# Patient Record
Sex: Male | Born: 1960 | Race: White | Hispanic: No | Marital: Married | Smoking: Former smoker
Health system: Southern US, Community
[De-identification: ages and names within clinical notes are randomized; demographics above are authoritative.]

## PROBLEM LIST (undated history)

## (undated) DIAGNOSIS — M79643 Pain in unspecified hand: Secondary | ICD-10-CM

## (undated) DIAGNOSIS — I2510 Atherosclerotic heart disease of native coronary artery without angina pectoris: Secondary | ICD-10-CM

## (undated) DIAGNOSIS — I219 Acute myocardial infarction, unspecified: Secondary | ICD-10-CM

## (undated) DIAGNOSIS — R109 Unspecified abdominal pain: Secondary | ICD-10-CM

## (undated) DIAGNOSIS — T7840XA Allergy, unspecified, initial encounter: Secondary | ICD-10-CM

## (undated) DIAGNOSIS — Z8619 Personal history of other infectious and parasitic diseases: Secondary | ICD-10-CM

## (undated) DIAGNOSIS — E785 Hyperlipidemia, unspecified: Secondary | ICD-10-CM

## (undated) DIAGNOSIS — N186 End stage renal disease: Secondary | ICD-10-CM

## (undated) DIAGNOSIS — N189 Chronic kidney disease, unspecified: Secondary | ICD-10-CM

## (undated) DIAGNOSIS — Z992 Dependence on renal dialysis: Secondary | ICD-10-CM

## (undated) DIAGNOSIS — J449 Chronic obstructive pulmonary disease, unspecified: Secondary | ICD-10-CM

## (undated) DIAGNOSIS — R10A1 Flank pain, right side: Secondary | ICD-10-CM

## (undated) DIAGNOSIS — J40 Bronchitis, not specified as acute or chronic: Secondary | ICD-10-CM

## (undated) DIAGNOSIS — C801 Malignant (primary) neoplasm, unspecified: Secondary | ICD-10-CM

## (undated) DIAGNOSIS — H9190 Unspecified hearing loss, unspecified ear: Secondary | ICD-10-CM

## (undated) DIAGNOSIS — M1990 Unspecified osteoarthritis, unspecified site: Secondary | ICD-10-CM

## (undated) DIAGNOSIS — I252 Old myocardial infarction: Secondary | ICD-10-CM

## (undated) DIAGNOSIS — I10 Essential (primary) hypertension: Secondary | ICD-10-CM

## (undated) HISTORY — DX: Essential (primary) hypertension: I10

## (undated) HISTORY — DX: Old myocardial infarction: I25.2

## (undated) HISTORY — PX: CARDIAC CATHETERIZATION: SHX172

## (undated) HISTORY — DX: Pain in unspecified hand: M79.643

## (undated) HISTORY — PX: RENAL BIOPSY: SHX156

## (undated) HISTORY — PX: COLONOSCOPY: SHX174

## (undated) HISTORY — DX: Hyperlipidemia, unspecified: E78.5

## (undated) HISTORY — DX: Acute myocardial infarction, unspecified: I21.9

## (undated) HISTORY — DX: Allergy, unspecified, initial encounter: T78.40XA

## (undated) HISTORY — DX: Malignant (primary) neoplasm, unspecified: C80.1

## (undated) HISTORY — DX: Flank pain, right side: R10.A1

## (undated) HISTORY — DX: Unspecified abdominal pain: R10.9

## (undated) HISTORY — DX: Bronchitis, not specified as acute or chronic: J40

## (undated) HISTORY — PX: VASECTOMY: SHX75

## (undated) HISTORY — PX: JOINT REPLACEMENT: SHX530

## (undated) HISTORY — DX: Atherosclerotic heart disease of native coronary artery without angina pectoris: I25.10

---

## 1998-06-14 DIAGNOSIS — I219 Acute myocardial infarction, unspecified: Secondary | ICD-10-CM

## 1998-06-14 HISTORY — DX: Acute myocardial infarction, unspecified: I21.9

## 2000-07-20 ENCOUNTER — Encounter: Payer: Self-pay | Admitting: Emergency Medicine

## 2000-07-21 ENCOUNTER — Inpatient Hospital Stay (HOSPITAL_COMMUNITY): Admission: EM | Admit: 2000-07-21 | Discharge: 2000-07-23 | Payer: Self-pay | Admitting: Emergency Medicine

## 2000-07-21 HISTORY — PX: CARDIAC CATHETERIZATION: SHX172

## 2001-12-28 ENCOUNTER — Encounter: Payer: Self-pay | Admitting: Cardiology

## 2001-12-28 ENCOUNTER — Ambulatory Visit (HOSPITAL_COMMUNITY): Admission: RE | Admit: 2001-12-28 | Discharge: 2001-12-28 | Payer: Self-pay | Admitting: Cardiology

## 2004-07-01 ENCOUNTER — Ambulatory Visit: Payer: Self-pay | Admitting: Cardiology

## 2004-07-07 ENCOUNTER — Ambulatory Visit: Payer: Self-pay | Admitting: Internal Medicine

## 2005-02-03 ENCOUNTER — Ambulatory Visit: Payer: Self-pay | Admitting: Internal Medicine

## 2005-08-10 ENCOUNTER — Ambulatory Visit: Payer: Self-pay | Admitting: Internal Medicine

## 2006-01-19 ENCOUNTER — Ambulatory Visit: Payer: Self-pay | Admitting: Internal Medicine

## 2006-01-26 ENCOUNTER — Ambulatory Visit: Payer: Self-pay | Admitting: Internal Medicine

## 2007-02-22 ENCOUNTER — Ambulatory Visit: Payer: Self-pay | Admitting: Internal Medicine

## 2007-02-22 LAB — CONVERTED CEMR LAB
ALT: 18 units/L (ref 0–53)
AST: 20 units/L (ref 0–37)
Albumin: 3.6 g/dL (ref 3.5–5.2)
Alkaline Phosphatase: 35 units/L — ABNORMAL LOW (ref 39–117)
BUN: 11 mg/dL (ref 6–23)
Bilirubin, Direct: 0.1 mg/dL (ref 0.0–0.3)
CO2: 26 meq/L (ref 19–32)
Calcium: 9.2 mg/dL (ref 8.4–10.5)
Chloride: 108 meq/L (ref 96–112)
Cholesterol: 176 mg/dL (ref 0–200)
Creatinine, Ser: 0.9 mg/dL (ref 0.4–1.5)
Direct LDL: 116.8 mg/dL
GFR calc Af Amer: 117 mL/min
GFR calc non Af Amer: 97 mL/min
Glucose, Bld: 102 mg/dL — ABNORMAL HIGH (ref 70–99)
HDL: 29.9 mg/dL — ABNORMAL LOW (ref >39.0)
Hgb A1c MFr Bld: 5.7 % (ref 4.6–6.0)
Potassium: 4 meq/L (ref 3.5–5.1)
Sodium: 140 meq/L (ref 135–145)
TSH: 2.88 microintl units/mL (ref 0.35–5.50)
Total Bilirubin: 0.6 mg/dL (ref 0.3–1.2)
Total CHOL/HDL Ratio: 5.9
Total Protein: 6.5 g/dL (ref 6.0–8.3)
Triglycerides: 221 mg/dL (ref 0–149)
VLDL: 44 mg/dL — ABNORMAL HIGH (ref 0–40)

## 2007-02-24 ENCOUNTER — Ambulatory Visit: Payer: Self-pay | Admitting: Internal Medicine

## 2008-03-12 ENCOUNTER — Ambulatory Visit: Payer: Self-pay | Admitting: Internal Medicine

## 2008-03-12 LAB — CONVERTED CEMR LAB
ALT: 18 units/L (ref 0–53)
AST: 23 units/L (ref 0–37)
Albumin: 3.6 g/dL (ref 3.5–5.2)
Alkaline Phosphatase: 37 units/L — ABNORMAL LOW (ref 39–117)
Bilirubin, Direct: 0.1 mg/dL (ref 0.0–0.3)
Cholesterol: 192 mg/dL (ref 0–200)
Direct LDL: 129 mg/dL
HDL: 31.7 mg/dL — ABNORMAL LOW (ref >39.0)
TSH: 3.28 microintl units/mL (ref 0.35–5.50)
Total Bilirubin: 0.6 mg/dL (ref 0.3–1.2)
Total CHOL/HDL Ratio: 6.1
Total Protein: 6.2 g/dL (ref 6.0–8.3)
Triglycerides: 294 mg/dL (ref 0–149)
VLDL: 59 mg/dL — ABNORMAL HIGH (ref 0–40)

## 2008-03-15 ENCOUNTER — Ambulatory Visit: Payer: Self-pay | Admitting: Internal Medicine

## 2009-01-22 ENCOUNTER — Telehealth (INDEPENDENT_AMBULATORY_CARE_PROVIDER_SITE_OTHER): Payer: Self-pay | Admitting: *Deleted

## 2009-03-13 DIAGNOSIS — I10 Essential (primary) hypertension: Secondary | ICD-10-CM

## 2009-03-13 DIAGNOSIS — I2510 Atherosclerotic heart disease of native coronary artery without angina pectoris: Secondary | ICD-10-CM | POA: Insufficient documentation

## 2009-03-13 DIAGNOSIS — E785 Hyperlipidemia, unspecified: Secondary | ICD-10-CM | POA: Insufficient documentation

## 2009-03-19 ENCOUNTER — Ambulatory Visit: Payer: Self-pay | Admitting: Internal Medicine

## 2009-03-19 LAB — CONVERTED CEMR LAB
ALT: 18 units/L (ref 0–53)
AST: 20 units/L (ref 0–37)
Albumin: 3.6 g/dL (ref 3.5–5.2)
Alkaline Phosphatase: 35 units/L — ABNORMAL LOW (ref 39–117)
Bilirubin, Direct: 0 mg/dL (ref 0.0–0.3)
Cholesterol: 181 mg/dL (ref 0–200)
Direct LDL: 126.3 mg/dL
HDL: 31.8 mg/dL — ABNORMAL LOW (ref >39.00)
TSH: 2.31 microintl units/mL (ref 0.35–5.50)
Total Bilirubin: 0.5 mg/dL (ref 0.3–1.2)
Total CHOL/HDL Ratio: 6
Total Protein: 6.3 g/dL (ref 6.0–8.3)
Triglycerides: 221 mg/dL — ABNORMAL HIGH (ref 0.0–149.0)
VLDL: 44.2 mg/dL — ABNORMAL HIGH (ref 0.0–40.0)

## 2009-03-21 ENCOUNTER — Ambulatory Visit: Payer: Self-pay | Admitting: Internal Medicine

## 2009-03-28 ENCOUNTER — Ambulatory Visit: Payer: Self-pay | Admitting: Internal Medicine

## 2009-03-28 LAB — CONVERTED CEMR LAB
BUN: 23 mg/dL (ref 6–23)
CO2: 23 meq/L (ref 19–32)
Calcium: 9.3 mg/dL (ref 8.4–10.5)
Chloride: 103 meq/L (ref 96–112)
Creatinine, Ser: 0.9 mg/dL (ref 0.4–1.5)
GFR calc non Af Amer: 95.55 mL/min (ref >60)
Glucose, Bld: 98 mg/dL (ref 70–99)
Potassium: 4.3 meq/L (ref 3.5–5.1)
Sodium: 134 meq/L — ABNORMAL LOW (ref 135–145)

## 2009-05-12 ENCOUNTER — Telehealth: Payer: Self-pay | Admitting: Internal Medicine

## 2009-10-20 ENCOUNTER — Ambulatory Visit: Payer: Self-pay | Admitting: Internal Medicine

## 2009-10-28 ENCOUNTER — Ambulatory Visit: Payer: Self-pay | Admitting: Internal Medicine

## 2009-11-14 LAB — CONVERTED CEMR LAB
ALT: 17 units/L (ref 0–53)
AST: 19 units/L (ref 0–37)
Albumin: 4.3 g/dL (ref 3.5–5.2)
Alkaline Phosphatase: 34 units/L — ABNORMAL LOW (ref 39–117)
Bilirubin, Direct: 0 mg/dL (ref 0.0–0.3)
Cholesterol: 161 mg/dL (ref 0–200)
Direct LDL: 91.1 mg/dL
HDL: 28.3 mg/dL — ABNORMAL LOW (ref >39.00)
Total Bilirubin: 0.2 mg/dL — ABNORMAL LOW (ref 0.3–1.2)
Total CHOL/HDL Ratio: 6
Total Protein: 6.8 g/dL (ref 6.0–8.3)
Triglycerides: 366 mg/dL — ABNORMAL HIGH (ref 0.0–149.0)
VLDL: 73.2 mg/dL — ABNORMAL HIGH (ref 0.0–40.0)

## 2010-02-09 ENCOUNTER — Telehealth: Payer: Self-pay | Admitting: Internal Medicine

## 2010-06-28 ENCOUNTER — Emergency Department (HOSPITAL_COMMUNITY)
Admission: EM | Admit: 2010-06-28 | Discharge: 2010-06-28 | Payer: Self-pay | Source: Home / Self Care | Admitting: Family Medicine

## 2010-06-29 LAB — LIPASE, BLOOD: Lipase: 48 U/L (ref 11–59)

## 2010-06-30 ENCOUNTER — Telehealth: Payer: Self-pay | Admitting: Internal Medicine

## 2010-07-08 ENCOUNTER — Telehealth: Payer: Self-pay | Admitting: Internal Medicine

## 2010-07-10 ENCOUNTER — Other Ambulatory Visit: Payer: Self-pay | Admitting: Family Medicine

## 2010-07-10 ENCOUNTER — Ambulatory Visit
Admission: RE | Admit: 2010-07-10 | Discharge: 2010-07-10 | Payer: Self-pay | Source: Home / Self Care | Attending: Family Medicine | Admitting: Family Medicine

## 2010-07-10 ENCOUNTER — Encounter: Payer: Self-pay | Admitting: Family Medicine

## 2010-07-10 DIAGNOSIS — R7309 Other abnormal glucose: Secondary | ICD-10-CM | POA: Insufficient documentation

## 2010-07-10 DIAGNOSIS — I252 Old myocardial infarction: Secondary | ICD-10-CM | POA: Insufficient documentation

## 2010-07-10 LAB — BASIC METABOLIC PANEL
BUN: 25 mg/dL — ABNORMAL HIGH (ref 6–23)
CO2: 26 mEq/L (ref 19–32)
Calcium: 10.1 mg/dL (ref 8.4–10.5)
Chloride: 106 mEq/L (ref 96–112)
Creatinine, Ser: 1.4 mg/dL (ref 0.4–1.5)
GFR: 59.02 mL/min — ABNORMAL LOW (ref >60.00)
Glucose, Bld: 91 mg/dL (ref 70–99)
Potassium: 4.8 mEq/L (ref 3.5–5.1)
Sodium: 139 mEq/L (ref 135–145)

## 2010-07-10 LAB — HEMOGLOBIN A1C: Hgb A1c MFr Bld: 5.9 % (ref 4.6–6.5)

## 2010-07-10 LAB — LIPID PANEL
Cholesterol: 180 mg/dL (ref 0–200)
HDL: 29.7 mg/dL — ABNORMAL LOW (ref >39.00)
Total CHOL/HDL Ratio: 6
Triglycerides: 399 mg/dL — ABNORMAL HIGH (ref 0.0–149.0)
VLDL: 79.8 mg/dL — ABNORMAL HIGH (ref 0.0–40.0)

## 2010-07-10 LAB — LDL CHOLESTEROL, DIRECT: Direct LDL: 109.4 mg/dL

## 2010-07-14 NOTE — Assessment & Plan Note (Signed)
Summary: 6 month/dmp      Allergies Added: NKDA  Visit Type:  6 mos  Primary Provider:  none  CC:  no complaints.  History of Present Illness: David Cowan returns today for followup.  He is a 50 yo man with a h/o CAD, s/p MI, h/o HTN, dyslipidemia and obesity.  He has not been able to lose much weight.  His blood pressure has been elevated.  He has tried to change his diet but still drinks soda's which are not diet.  He denies c/p or sob but continues to have problems with his shoulder from athritis.  Problems Prior to Update: 1)  Dyslipidemia  (ICD-272.4) 2)  Hypertension  (ICD-401.9) 3)  Cad  (ICD-414.00)  Current Medications (verified): 1)  Tricor 145 Mg Tabs (Fenofibrate) .... Take One Tablet Once Daily 2)  Metoprolol Succinate 50 Mg Xr24h-Tab (Metoprolol Succinate) .... Take One Tablet By Mouth At Bedtime 3)  Simvastatin 40 Mg Tabs (Simvastatin) .... Take One Tablet By Mouth Daily At Bedtime 4)  Aspirin Ec 325 Mg Tbec (Aspirin) .... Take One Tablet By Mouth Daily 5)  Enalapril Maleate 5 Mg Tabs (Enalapril Maleate) .... Take 1 By Mouth Two Times A Day 6)  Nitrostat 0.4 Mg Subl (Nitroglycerin) .Marland Kitchen.. 1 Tablet Under Tongue At Onset of Chest Pain; You May Repeat Every 5 Minutes For Up To 3 Doses. 7)  Folic Acid 1 Mg Tabs (Folic Acid) .... Take 1 By Mouth Once Daily 8)  Maxzide-25 37.5-25 Mg Tabs (Triamterene-Hctz) .... One By Mouth Daily  Allergies (verified): No Known Drug Allergies  Past History:  Past Medical History: Last updated: 03/13/2009 Current Problems:  DYSLIPIDEMIA (ICD-272.4) HYPERTENSION (ICD-401.9) CAD (ICD-414.00)  Review of Systems  The patient denies chest pain, syncope, dyspnea on exertion, and peripheral edema.    Vital Signs:  Patient profile:   50 year old male Height:      71 inches Weight:      225 pounds BMI:     31.49 Pulse rate:   87 / minute BP sitting:   137 / 85  (left arm) Cuff size:   large  Vitals Entered By: Oswald Hillock  (Oct 20, 2009 11:47 AM)  Physical Exam  General:  Well developed, well nourished, in no acute distress.  HEENT: normal Neck: supple. No JVD. Carotids 2+ bilaterally no bruits Cor: RRR no rubs, gallops or murmur Lungs: CTA Ab: soft, nontender. nondistended. No HSM. Good bowel sounds Ext: warm. no cyanosis, clubbing or edema Neuro: alert and oriented. Grossly nonfocal. affect pleasant    Impression & Recommendations:  Problem # 1:  CAD (ICD-414.00) He denies anginal symptoms.  I have asked him to continue his current meds and maintain a low sodium diet. His updated medication list for this problem includes:    Metoprolol Succinate 50 Mg Xr24h-tab (Metoprolol succinate) .Marland Kitchen... Take one tablet by mouth at bedtime    Aspirin Ec 325 Mg Tbec (Aspirin) .Marland Kitchen... Take one tablet by mouth daily    Enalapril Maleate 5 Mg Tabs (Enalapril maleate) .Marland Kitchen... Take 1 by mouth two times a day    Nitrostat 0.4 Mg Subl (Nitroglycerin) .Marland Kitchen... 1 tablet under tongue at onset of chest pain; you may repeat every 5 minutes for up to 3 doses.  Problem # 2:  HYPERTENSION (ICD-401.9) His blood pressure today is still reasonably well controlled.  Continue current meds. His updated medication list for this problem includes:    Metoprolol Succinate 50 Mg Xr24h-tab (Metoprolol succinate) .Marland Kitchen... Take  one tablet by mouth at bedtime    Aspirin Ec 325 Mg Tbec (Aspirin) .Marland Kitchen... Take one tablet by mouth daily    Enalapril Maleate 5 Mg Tabs (Enalapril maleate) .Marland Kitchen... Take 1 by mouth two times a day    Maxzide-25 37.5-25 Mg Tabs (Triamterene-hctz) ..... One by mouth daily  Problem # 3:  DYSLIPIDEMIA (ICD-272.4) He will continue meds as below and maintain a low fat diet. Will check fasting lipids. His updated medication list for this problem includes:    Tricor 145 Mg Tabs (Fenofibrate) .Marland Kitchen... Take one tablet once daily    Simvastatin 40 Mg Tabs (Simvastatin) .Marland Kitchen... Take one tablet by mouth daily at bedtime  Other Orders: TLB-Lipid  Panel (80061-LIPID) TLB-Hepatic/Liver Function Pnl (80076-HEPATIC)  Patient Instructions: 1)  Your physician recommends that you return for a FASTING lipid profile: in the next week (lipid/liver dx 272.2) 2)  Your physician wants you to follow-up in:  12 months with Dr Ladona Ridgel. You will receive a reminder letter in the mail two months in advance. If you don't receive a letter, please call our office to schedule the follow-up appointment.

## 2010-07-14 NOTE — Progress Notes (Signed)
Summary: refill request   Phone Note Refill Request Message from:  Patient on February 09, 2010 9:26 AM  Refills Requested: Medication #1:  TRICOR 145 MG TABS take one tablet once daily [facility] 90 day supply   Method Requested: Telephone to Pharmacy Initial call taken by: Glynda Jaeger,  February 09, 2010 9:27 AM    Prescriptions: TRICOR 145 MG TABS (FENOFIBRATE) take one tablet once daily  #90 x 2   Entered by:   Hardin Negus, RMA   Authorized by:   Laren Boom, MD, [facility]   Signed by:   Hardin Negus, RMA on 02/09/2010   Method used:   Electronically to        [facility]* (retail)             ,          Ph: [phone]       Fax: [phone]   RxID:   [id number]

## 2010-07-15 ENCOUNTER — Telehealth: Payer: Self-pay | Admitting: Internal Medicine

## 2010-07-16 NOTE — Progress Notes (Signed)
Summary: rx refill   Phone Note Refill Request Message from:  Patient on June 30, 2010 10:36 AM  Refills Requested: Medication #1:  ENALAPRIL MALEATE 5 MG TABS Take 1 by mouth two times a day  Method Requested: Fax to Local Pharmacy Initial call taken by: Roe Coombs,  June 30, 2010 10:36 AM    Prescriptions: ENALAPRIL MALEATE 5 MG TABS (ENALAPRIL MALEATE) Take 1 by mouth two times a day  #180 Tablet x 5   Entered by:   Laurance Flatten CMA   Authorized by:   Laren Boom, MD, [facility]   Signed by:   Laurance Flatten CMA on 07/03/2010   Method used:   [facility]ail[facility]       [address]       [place], [place]  [address]       Ph: [phone] or [phone]       Fax: [phone]   RxID:   [id number]

## 2010-07-16 NOTE — Assessment & Plan Note (Signed)
Summary: NEW PATIENT, EST / LFW   Vital Signs:  Patient profile:   50 year old male Height:      71 inches Weight:      232.50 pounds BMI:     32.54 Temp:     97.7 degrees F oral Pulse rate:   97 / minute Pulse rhythm:   regular BP sitting:   140 / 90  (right arm) Cuff size:   large  Vitals Entered By: Linde Gillis CMA Duncan Dull) (July 10, 2010 1:50 PM) CC: new patient, establish care   History of Present Illness: 50 yo here to establish care.  H/o CAD with significant risk factors followed by Dr. Ladona Ridgel. Pt had MI in 11/01/98, his dad died of MI at 29 and his cousin just died of an MI in her early 7s.  Pt is also a smoker with low HDL and high TG. Has had no recurrent episodes of chest pain.  Did have some "rib pain" after coughing several weeks ago.  Went to [facility] urgent care and diagnosed with rib fracture.  Pain is slowly improving.  Family h/o Factor V leiden deficiency- both his brother and sister have Factor V Leiden.  He has never been tested but is interested in being tested.  H/o hyperglyecmia, would like his a1c rechecked.  HTN- on Metoprolol 50 mg daily, Enalapril 5 mg daily and Maxzide 25 37.5 mg daily.  Has felt like his BP has been elevated for past several months, feels more flushed.  No HA, CP or SOB.  Preventive Screening-Counseling & Management  Alcohol-Tobacco     Smoking Status: current  Current Problems (verified): 1)  Myocardial Infarction, Hx of  (ICD-412) 2)  Fh of Factor V Deficiency  (ICD-286.3) 3)  Hyperglycemia  (ICD-790.29) 4)  Dyslipidemia  (ICD-272.4) 5)  Hypertension  (ICD-401.9) 6)  Cad  (ICD-414.00)  Current Medications (verified): 1)  Tricor 145 Mg Tabs (Fenofibrate) .... Take One Tablet Once Daily 2)  Metoprolol Succinate 50 Mg Xr24h-Tab (Metoprolol Succinate) .... Take One Tablet By Mouth At Bedtime 3)  Simvastatin 40 Mg Tabs (Simvastatin) .... Take One Tablet By Mouth Daily At Bedtime 4)  Aspirin Ec 325 Mg Tbec (Aspirin) .... Take  One Tablet By Mouth Daily 5)  Enalapril Maleate 10 Mg  Tabs (Enalapril Maleate) .... Take 1 Tab By Mouth Daily 6)  Nitrostat 0.4 Mg Subl (Nitroglycerin) .Marland Kitchen.. 1 Tablet Under Tongue At Onset of Chest Pain; You May Repeat Every 5 Minutes For Up To 3 Doses. 7)  Folic Acid 1 Mg Tabs (Folic Acid) .... Take 1 By Mouth Once Daily 8)  Maxzide-25 37.5-25 Mg Tabs (Triamterene-Hctz) .... One By Mouth Daily 9)  Benzonatate 100 Mg Caps (Benzonatate) .... Take One Tablet By Mouth Three Times A Day For Cough  Allergies (verified): 1)  ! Amoxicillin  Past History:  Past Medical History: Last updated: 03/13/2009 Current Problems:  DYSLIPIDEMIA (ICD-272.4) HYPERTENSION (ICD-401.9) CAD (ICD-414.00)  Family History: Last updated: 03/13/2009 family history of early coronary disease   father died of a heart attack approximately 89 and  uncle at age 58   sister with factor V deficiency.  Social History: Last updated: 07/10/2010 Married two daughters Current Smoker  Risk Factors: Smoking Status: current (07/10/2010)  Past Surgical History: Vasectomy  Social History: Married two daughters Current Smoker Smoking Status:  current  Review of Systems      See HPI General:  Denies fatigue. Eyes:  Denies blurring. ENT:  Denies difficulty swallowing. CV:  Denies  chest pain or discomfort. Resp:  Denies shortness of breath. GI:  Denies abdominal pain and change in bowel habits. GU:  Denies dysuria. MS:  Denies joint pain, joint redness, and joint swelling. Derm:  Denies rash. Neuro:  Denies headaches. Psych:  Denies anxiety and depression. Endo:  Denies cold intolerance and heat intolerance. Heme:  Denies abnormal bruising and bleeding.  Physical Exam  General:  Well developed, well nourished, in no acute distress.  HEENT: normal Neck: supple. No JVD. Carotids 2+ bilaterally no bruits Cor: RRR no rubs, gallops or murmur Lungs: CTA chest wall: mildly TTP over left inferior rib Ab:  soft, nontender. nondistended. No HSM. Good bowel sounds Ext: warm. no cyanosis, clubbing or edema Neuro: alert and oriented. Grossly nonfocal. affect pleasant  Skin:  Intact without suspicious lesions or rashes Cervical Nodes:  No lymphadenopathy noted Psych:  Cognition and judgment appear intact. Alert and cooperative with normal attention span and concentration. No apparent delusions, illusions, hallucinations   Impression & Recommendations:  Problem # 1:  Family Hx of FACTOR V DEFICIENCY (ICD-286.3) Assessment Unchanged per pt request, will check Factor V mutation. Orders: T-Factor V Mutation, Leiden (83890/[id number]) Specimen Handling ([address])  Problem # 2:  HYPERTENSION (ICD-401.9) Assessment: Deteriorated increase enalapril to 10 mg daily. His updated medication list for this problem includes:    Metoprolol Succinate 50 Mg Xr24h-tab (Metoprolol succinate) .Marland Kitchen... Take one tablet by mouth at bedtime    Enalapril Maleate 10 Mg Tabs (Enalapril maleate) .Marland Kitchen... Take 1 tab by mouth daily    Maxzide-25 37.5-25 Mg Tabs (Triamterene-hctz) ..... One by mouth daily  Orders: Venipuncture ([address]) TLB-BMP (Basic Metabolic Panel-BMET) (80048-METABOL)  Problem # 3:  DYSLIPIDEMIA (ICD-272.4) Assessment: Unchanged recheck lipid panel today. His updated medication list for this problem includes:    Tricor 145 Mg Tabs (Fenofibrate) .Marland Kitchen... Take one tablet once daily    Simvastatin 40 Mg Tabs (Simvastatin) .Marland Kitchen... Take one tablet by mouth daily at bedtime  Orders: TLB-Lipid Panel (80061-LIPID)  Problem # 4:  MYOCARDIAL INFARCTION, HX OF (ICD-412) Assessment: Unchanged followed by Dr. Ladona Ridgel.  Stongly urged smoking cessation. His updated medication list for this problem includes:    Metoprolol Succinate 50 Mg Xr24h-tab (Metoprolol succinate) .Marland Kitchen... Take one tablet by mouth at bedtime    Aspirin Ec 325 Mg Tbec (Aspirin) .Marland Kitchen... Take one tablet by mouth daily    Enalapril Maleate 10 Mg Tabs  (Enalapril maleate) .Marland Kitchen... Take 1 tab by mouth daily    Nitrostat 0.4 Mg Subl (Nitroglycerin) .Marland Kitchen... 1 tablet under tongue at onset of chest pain; you may repeat every 5 minutes for up to 3 doses.    Maxzide-25 37.5-25 Mg Tabs (Triamterene-hctz) ..... One by mouth daily  Problem # 5:  HYPERGLYCEMIA (ICD-790.29) Assessment: New recheck a1c today. Orders: TLB-A1C / Hgb A1C (Glycohemoglobin) (83036-A1C)  Complete Medication List: 1)  Tricor 145 Mg Tabs (Fenofibrate) .... Take one tablet once daily 2)  Metoprolol Succinate 50 Mg Xr24h-tab (Metoprolol succinate) .... Take one tablet by mouth at bedtime 3)  Simvastatin 40 Mg Tabs (Simvastatin) .... Take one tablet by mouth daily at bedtime 4)  Aspirin Ec 325 Mg Tbec (Aspirin) .... Take one tablet by mouth daily 5)  Enalapril Maleate 10 Mg Tabs (Enalapril maleate) .... Take 1 tab by mouth daily 6)  Nitrostat 0.4 Mg Subl (Nitroglycerin) .Marland Kitchen.. 1 tablet under tongue at onset of chest pain; you may repeat every 5 minutes for up to 3 doses. 7)  Folic Acid 1 Mg Tabs (Folic acid) .Marland KitchenMarland KitchenMarland Kitchen  Take 1 by mouth once daily 8)  Maxzide-25 37.5-25 Mg Tabs (Triamterene-hctz) .... One by mouth daily 9)  Benzonatate 100 Mg Caps (Benzonatate) .... Take one tablet by mouth three times a day for cough  Patient Instructions: 1)  Great to meet you. 2)  Please make an appointment to come see me in May. Prescriptions: ENALAPRIL MALEATE 10 MG  TABS (ENALAPRIL MALEATE) Take 1 tab by mouth daily  #90 x 3   Entered and Authorized by:   Ruthe Mannan MD   Signed by:   Ruthe Mannan MD on 07/10/2010   Method used:   Faxed to ...       [facility] (mail-order)             , [place]         Ph: [phone]       Fax: [phone]   RxID:   [id number] ENALAPRIL MALEATE 10 MG  TABS (ENALAPRIL MALEATE) Take 1 tab by mouth daily  #90 x 3   Entered and Authorized by:   Ruthe Mannan MD   Signed by:   Ruthe Mannan MD on 07/10/2010   Method used:   [id number]       [address]       [place], [place]  [address]       Ph: [phone] or [phone]       Fax: [phone]   RxID:   [id number]    Orders Added: 1)  Venipuncture [01601] 2)  TLB-BMP (Basic Metabolic Panel-BMET) [80048-METABOL] 3)  TLB-Lipid Panel [80061-LIPID] 4)  TLB-A1C / Hgb A1C (Glycohemoglobin) [83036-A1C] 5)  T-Factor V Mutation, Leiden [83890/[id number]] 6)  Specimen Handling [99000] 7)  New Patient Level III [09323]    Current Allergies (reviewed today): ! AMOXICILLIN  TD Result Date:  07/03/2003 TD Result:  historical

## 2010-07-16 NOTE — Progress Notes (Signed)
Summary: needs refill out meds   Phone Note Refill Request Message from:  Patient on July 08, 2010 3:57 PM  Refills Requested: Medication #1:  ENALAPRIL MALEATE 5 MG TABS Take 1 by mouth two times a day [facility] pt in out meds  Initial call taken by: Judie Grieve,  July 08, 2010 3:58 PM    Prescriptions: ENALAPRIL MALEATE 5 MG TABS (ENALAPRIL MALEATE) Take 1 by mouth two times a day  #180 Tablet x 3   Entered by:   Kem Parkinson   Authorized by:   Laren Boom, MD, [facility]   Signed by:   Kem Parkinson on 07/10/2010   Method used:   Electronically to        [facility]* (retail)             ,          Ph: [phone]       Fax: [phone]   RxID:   [id number]

## 2010-08-11 NOTE — Progress Notes (Signed)
Summary: [facility] has questions   Phone Note From Pharmacy   CallerPenni Homans ORDER* [phone] Summary of Call: ref [id number] question re alipurinol Initial call taken by: Glynda Jaeger,  July 15, 2010 4:31 PM

## 2010-09-23 ENCOUNTER — Other Ambulatory Visit: Payer: Self-pay | Admitting: *Deleted

## 2010-09-23 MED ORDER — ENALAPRIL MALEATE 10 MG PO TABS: 10.0000 mg | ORAL_TABLET | Freq: Two times a day (BID) | ORAL | Status: DC

## 2010-09-23 NOTE — Telephone Encounter (Signed)
Ok to refill as below.

## 2010-09-23 NOTE — Telephone Encounter (Signed)
Pt's wife states his enalapril dose was changed to 10 mg's twice a day.  He needs [facility] script [facility] and a 90 day script sent to [facility].

## 2010-09-23 NOTE — Telephone Encounter (Signed)
[facility] to [facility].

## 2010-09-26 ENCOUNTER — Encounter: Payer: Self-pay | Admitting: Family Medicine

## 2010-10-08 ENCOUNTER — Other Ambulatory Visit: Payer: Self-pay | Admitting: Family Medicine

## 2010-10-09 ENCOUNTER — Other Ambulatory Visit: Payer: Self-pay | Admitting: *Deleted

## 2010-10-09 MED ORDER — TRIAMTERENE-HCTZ 37.5-25 MG PO TABS: 1.0000 | ORAL_TABLET | Freq: Every day | ORAL | Status: DC

## 2010-10-12 ENCOUNTER — Telehealth: Payer: Self-pay | Admitting: Internal Medicine

## 2010-10-12 MED ORDER — TRIAMTERENE-HCTZ 37.5-25 MG PO TABS: 1.0000 | ORAL_TABLET | Freq: Every day | ORAL | Status: DC

## 2010-10-12 NOTE — Telephone Encounter (Signed)
Pt needs triamterene 37.5-25mg  to be [facility] [phone]

## 2010-10-19 ENCOUNTER — Other Ambulatory Visit (INDEPENDENT_AMBULATORY_CARE_PROVIDER_SITE_OTHER): Payer: 59 | Admitting: Family Medicine

## 2010-10-19 ENCOUNTER — Other Ambulatory Visit: Payer: Self-pay | Admitting: Family Medicine

## 2010-10-19 DIAGNOSIS — E785 Hyperlipidemia, unspecified: Secondary | ICD-10-CM

## 2010-10-19 DIAGNOSIS — Z13220 Encounter for screening for lipoid disorders: Secondary | ICD-10-CM

## 2010-10-19 DIAGNOSIS — I10 Essential (primary) hypertension: Secondary | ICD-10-CM

## 2010-10-19 DIAGNOSIS — R7309 Other abnormal glucose: Secondary | ICD-10-CM

## 2010-10-19 LAB — BASIC METABOLIC PANEL
BUN: 23 mg/dL (ref 6–23)
CO2: 25 mEq/L (ref 19–32)
Calcium: 8.9 mg/dL (ref 8.4–10.5)
Chloride: 102 mEq/L (ref 96–112)
Creatinine, Ser: 1.1 mg/dL (ref 0.4–1.5)
GFR: 78.6 mL/min (ref >60.00)
Glucose, Bld: 106 mg/dL — ABNORMAL HIGH (ref 70–99)
Potassium: 4.9 mEq/L (ref 3.5–5.1)
Sodium: 135 mEq/L (ref 135–145)

## 2010-10-19 LAB — LIPID PANEL
Cholesterol: 182 mg/dL (ref 0–200)
HDL: 31.2 mg/dL — ABNORMAL LOW (ref >39.00)
Total CHOL/HDL Ratio: 6
Triglycerides: 293 mg/dL — ABNORMAL HIGH (ref 0.0–149.0)
VLDL: 58.6 mg/dL — ABNORMAL HIGH (ref 0.0–40.0)

## 2010-10-19 LAB — LDL CHOLESTEROL, DIRECT: Direct LDL: 114.3 mg/dL

## 2010-10-26 ENCOUNTER — Ambulatory Visit (INDEPENDENT_AMBULATORY_CARE_PROVIDER_SITE_OTHER): Payer: 59 | Admitting: Family Medicine

## 2010-10-26 ENCOUNTER — Telehealth: Payer: Self-pay | Admitting: Internal Medicine

## 2010-10-26 ENCOUNTER — Encounter: Payer: Self-pay | Admitting: Family Medicine

## 2010-10-26 VITALS — BP 140/82 | HR 83 | Temp 98.0°F | Ht 70.5 in | Wt 235.0 lb

## 2010-10-26 DIAGNOSIS — Z1211 Encounter for screening for malignant neoplasm of colon: Secondary | ICD-10-CM

## 2010-10-26 DIAGNOSIS — E785 Hyperlipidemia, unspecified: Secondary | ICD-10-CM

## 2010-10-26 DIAGNOSIS — Z0000 Encounter for general adult medical examination without abnormal findings: Secondary | ICD-10-CM

## 2010-10-26 NOTE — Assessment & Plan Note (Signed)
Unchanged. Given copy of labs to pt to give to Dr. Ladona Ridgel next month. Discussed- Weight loss (even small amount) can decrease triglycerides.  Decrease added sugars, eliminate trans fats, increase fiber and limit alcohol.

## 2010-10-26 NOTE — Assessment & Plan Note (Signed)
Reviewed preventive care protocols, scheduled due services, and updated immunizations Discussed nutrition, exercise, diet, and healthy lifestyle.  Orders Placed This Encounter  Procedures  . Ambulatory referral to Gastroenterology    

## 2010-10-26 NOTE — Progress Notes (Signed)
50 yo with h/o CAD with significant risk factors followed by Dr. Ladona Ridgel here for CPX.  Pt had MI in 11-04-98, his dad died of MI at 75 and his cousin just died of an MI in her early 3s.  Pt is also a smoker with low HDL ( a little improved this month at 31.20) and high TG (293 this month). On Simvastatin 40 mg daily, and Tricor 145 mg daily.  Has had no recurrent episodes of chest pain.  Family h/o Factor V leiden deficiency- both his brother and sister have Factor V Leiden.  He has never been tested but is interested in being tested.   HTN- on Metoprolol 50 mg daily, Enalapril 5 mg daily and Maxzide 25 37.5 mg daily.    No HA, CP or SOB. BP Readings from Last 3 Encounters:  10/26/10 140/82  07/10/10 140/90  10/20/09 137/85    The PMH, PSH, Social History, Family History, Medications, and allergies have been reviewed in [facility], and have been updated if relevant.  Review of Systems       See HPI General:  Denies fatigue. Eyes:  Denies blurring. ENT:  Denies difficulty swallowing. CV:  Denies chest pain or discomfort. Resp:  Denies shortness of breath. GI:  Denies abdominal pain and change in bowel habits. GU:  Denies dysuria. MS:  Denies joint pain, joint redness, and joint swelling. Derm:  Denies rash. Neuro:  Denies headaches. Psych:  Denies anxiety and depression. Endo:  Denies cold intolerance and heat intolerance. Heme:  Denies abnormal bruising and bleeding.  Physical Exam BP 140/82  Pulse 83  Temp(Src) 98 F (36.7 C) (Oral)  Ht 5' 10.5" (1.791 m)  Wt 235 lb (106.595 kg)  BMI 33.24 kg/m2  General:  overweght male in NAD Eyes:  PERRL Ears:  External ear exam shows no significant lesions or deformities.  Otoscopic examination reveals clear canals, tympanic membranes are intact bilaterally without bulging, retraction, inflammation or discharge. Hearing is grossly normal bilaterally. Nose:  External nasal examination shows no deformity or inflammation. Nasal mucosa are  pink and moist without lesions or exudates. Mouth:  Oral mucosa and oropharynx without lesions or exudates.  Teeth in good repair. Neck:  no carotid bruit or thyromegaly no cervical or supraclavicular lymphadenopathy  Lungs:  Normal respiratory effort, chest expands symmetrically. Lungs are clear to auscultation, no crackles or wheezes. Heart:  Normal rate and regular rhythm. S1 and S2 normal without gallop, murmur, click, rub or other extra sounds. Abdomen:  Bowel sounds positive,abdomen soft and non-tender without masses, organomegaly or hernias noted. Genitalia:  Testes bilaterally descended without nodularity, tenderness or masses. No scrotal masses or lesions. No penis lesions or urethral discharge. Prostate:  Prostate gland firm and smooth, 1 plus enlargement, no nodularity, tenderness, mass, asymmetry or induration. Pulses:  R and L posterior tibial pulses are full and equal bilaterally  Extremities:  no edema

## 2010-10-26 NOTE — Patient Instructions (Signed)
Please stop by to see David Cowan on your way out.   Great to see you and happy belated birthday!

## 2010-10-26 NOTE — Telephone Encounter (Signed)
[facility] requested Attending Phys on 4-12.  Has this been sent out?  Agent is following up on this request.

## 2010-10-27 NOTE — Assessment & Plan Note (Signed)
David Cowan                         ELECTROPHYSIOLOGY OFFICE NOTE   David Cowan                       MRN:          [id number]  DATE:02/24/2007                            DOB:          Oct 21, 1960    David Cowan returns today for followup.  He is a very pleasant middle-aged  man with a history of ischemic heart disease status post MI with  preserved LV function, dyslipidemia and hypertension, who returns today  for followup.  In the interim, his weight has remained pretty much the  same.  He denies chest pain.  He denies shortness of breath.  He is  concerned about the high cost of his medical therapy.   PRESENT MEDICATIONS:  1. Aspirin 325 a day.  2. Altace 2.5 daily.  3. Toprol XL 50 a day.  4. Folate.  5. Crestor 10 a day.  6. Triglide 160 daily.   PHYSICAL EXAMINATION:  GENERAL:  He is a pleasant well-appearing middle-  aged man in no distress.  VITAL SIGNS:  Blood pressure was 160/99, the pulse 70 and regular.  Respirations were 16.  The weight was 231 pounds.  NECK:  Revealed no jugular venous distention.  There is no thyromegaly.  LUNGS:  Clear bilaterally to auscultation.  No wheezes, rales or rhonchi  are present.  CARDIOVASCULAR:  Revealed a regular rate and rhythm and normal S1, S2.  There are no murmurs, rubs or gallops present.  ABDOMEN:  Soft, nontender, nondistended.  There is no organomegaly.  EXTREMITIES:  Demonstrated no cyanosis, clubbing or edema.  The pulses  are 2+ and symmetric.   EKG demonstrates sinus rhythm with normal axis intervals.   IMPRESSION:  1. Coronary artery disease (stable.)  2. Hypertension.  3. Dyslipidemia.   DISCUSSION:  I have discussed treatment options with David Cowan, because  of his high cost of his medical care.  I recommend that we switch him  from Altace 2.5 mg daily, to Enalapril 5 mg twice daily, with the  increased dose secondary to his hypertension.  We will switch him from  Toprol  XL to metoprolol ER 50 a day.  We will switch him from Crestor 10  a day, to Simvastatin 40 a day.  He will continue Triglide and aspirin.  I will plan to see him back.  He also continued  folate.  I will plan to see him back in the office in one year or  sooner, for any additional problems.     David Cowan. David Ridgel, MD  Electronically Signed    [facility]  DD: 02/24/2007  DT: 02/25/2007  Job #: [id number]   cc:   David Cowan, M.D.

## 2010-10-27 NOTE — Assessment & Plan Note (Signed)
[facility]                         ELECTROPHYSIOLOGY OFFICE NOTE   ABHIRAM, CRIADO                       MRN:          [id number]  DATE:03/15/2008                            DOB:          1960/08/22    David Cowan returns today for followup.  He is a very pleasant middle-aged  male with coronary artery disease, hypertension, and dyslipidemia who  returns today for followup.  His weight is up approximately 4 pounds  from the last year.  He has had no chest pain.  He does note some  shoulder discomfort, which is arthritis.  The patient does not check his  blood pressure at home.  It is usually high here in the office, though  he states that when he has checked this at home, it has not been as  high.   MEDICATIONS:  Today include;  1. Aspirin 325 a day.  2. Folate 1 mg a day.  3. Triglide 160 mg daily.  4. Enalapril 5 mg twice a day.  5. Metoprolol ER 50 a day.  6. Folate 1 mg.  7. He is not on Altace.  8. Simvastatin 40 a day.   PHYSICAL EXAMINATION:  GENERAL:  He is a pleasant well-appearing middle-  aged man in no distress.  VITAL SIGNS:  Blood pressure today was 170/110.  When I checked his  blood pressure, it was 155/95.  Pulse was 73 and regular, the  respirations were 18, and the weight was 235 pounds.  NECK:  No jugular venous distention.  LUNGS:  Clear bilaterally to auscultation.  No wheezes, rales, or  rhonchi are present.  CARDIOVASCULAR:  A regular rate and rhythm with normal S1 and S2.  EXTREMITIES:  No edema.   The EKG demonstrates sinus rhythm with normal axis and intervals.   IMPRESSION:  1. Ischemic heart disease status post myocardial infarction with      preserved left ventricular function.  2. Hypertension.  3. Dyslipidemia.   DISCUSSION:  I have discussed the importance to Mr. Hanken checking his  blood pressure daily or at least once a week and recording it, and I  have asked that he come back when he sees me  again with a book for  helping [place] to document what his blood pressures really are.  My sense is  that there are not quite normal, but that they are probably much lower  than what they are when he comes to my office.  In addition, I have  asked that he maintain a low-salt, low-fat, and low-sweet diet.  He  admits to drinking sodas, which are not diet stating that diet drinks  making him to have a headache.  I have asked that he drink water.  I  will plan to see the patient back in the  office in followup in 1 year.  I have given him a prescription for  sublingual nitroglycerin because these have expired though he is not  using any of it at the present time.  He will continue on his current  medicines.     Doylene Canning. Ladona Ridgel, MD  Electronically Signed    GWT/MedQ  DD: 03/15/2008  DT: 03/16/2008  Job #: [id number]

## 2010-10-30 NOTE — Assessment & Plan Note (Signed)
[facility]                           ELECTROPHYSIOLOGY OFFICE NOTE   David [facility]an, ULLOM                       MRN:          [id number]  DATE:01/26/2006                            DOB:          [birth year]    HISTORY:  David Cowan returns here in followup. He is a very pleasant 50-year-  old man with premature coronary disease and Factor V Leiden deficiency and  hypertension and dyslipidemia. He has had no chest pain since we saw him  last year ago. When I saw him at that time, I had him go for evaluation of  erythematous macula on his right leg, and he was told this was a  prediabetic skin condition and was thought to be benign. He had his blood  sugar checked, and his fasting glucose was 100. He denies shortness of  breath. Overall, he feels well.   PHYSICAL EXAMINATION:  GENERAL:  He is a pleasant, well-appearing, middle-  aged man in no distress.  VITAL SIGNS:  Blood pressure was 142/88, pulse 73 and regular, respirations  were 18. Weight was 228 pounds, and his weight was down 5 pounds from his  visit a year ago.  NECK:  The neck revealed no jugular venous distention.  LUNGS:  Clear bilaterally to auscultation.  CARDIOVASCULAR:  Revealed a regular rate and rhythm, normal S1 and S2.  EXTREMITIES:  His extremities demonstrate no edema. There are erythematous  macules still present.   STUDIES:  EKG demonstrates sinus rhythm with normal axis and intervals.   His labs today demonstrated HDL cholesterol of 99, a total cholesterol of  160, and a LDL cholesterol of 95. His glucose was as noted, 100.   IMPRESSION:  1. Ischemic heart disease.  2. Hypertension.  3. Dyslipidemia.   DISCUSSION:  Will plan to send David Cowan for a hemoglobin A1c to see what  his blood sugars have been running over the last 3 months. I will see him  back in a year. He will continue on his present medications which aspirin,  Altace, Toprol, folate acid, Crestor and  Triglide.                                   David Cowan. David Ridgel, MD   GWT/MedQ  DD:  01/26/2006  DT:  01/26/2006  Job #:  [id number]   cc:   David Gowda, MD

## 2010-10-30 NOTE — Cardiovascular Report (Signed)
[place]. [facility]  Patient:    LONNY, EISEN                         MRN: [id number] Proc. Date: 07/21/00 Attending:  Lewayne Bunting, M.D. CC:         Candelaria Celeste, M.D.  Doylene Canning. Ladona Ridgel, M.D. [facility]  Luis Abed, M.D. [facility]   Cardiac Catheterization  DATE OF BIRTH:  Aug 04, 1960  REFERRING PHYSICIAN:  Candelaria Celeste, M.D.  CARDIOLOGISTS:  Doylene Canning. Ladona Ridgel, M.D. and Luis Abed, M.D.  PROCEDURES PERFORMED: 1. Left heart catheterization with selective coronary angiography. 2. Ventriculography.  HISTORY:  Mr. Dorn is a 50 year old white male, with a family history of coronary artery disease.  There is also a family history of Factor V Leiden deficiency in his sister.  The patient has been admitted with acute coronary syndrome and has ruled in for a non-Q-wave myocardial infarction.  He has been referred for diagnostic catheterization to assess his coronary anatomy.  DESCRIPTION OF PROCEDURE:  After informed consent was obtained, the patient was brought to the catheterization laboratory.  The right groin was sterilely prepped and draped.  Lidocaine 1% was used to infiltrate the right groin and a 6 French arterial sheath was placed using the modified Seldinger technique. Subsequently, a 6 [place] and JR4 catheter was used to engage the left and right coronary arteries.  Selective coronary angiography was performed in various projections using manual injections of contrast.  After selective coronary angiography ventriculography was performed with a straight 6 French pigtail catheter.  The pigtail catheter was placed in the left ventricular cavity and appropriate left-sided hemodynamics were obtained.  Subsequently, ventriculography was performed in single plane RAO projection using power injection of contrast.  At the termination of the case, the pigtail catheter was pulled back and the sheath was removed.  Manual pressure was applied until adequate  hemostasis was achieved.  The patient tolerated the procedure well and was transferred to the ward in stable condition.  FINDINGS:  HEMODYNAMICS:  Aortic pressure 115/74, left ventricular pressure 115/16 mmHg.  VENTRICULOGRAPHY:  Ejection fraction is 45-50%.  There is a area of mid and distal akinesis on the anterior wall.  The apex appears to be sparse. There is normal contraction of the inferior wall.  SELECTIVE CORONARY ANGIOGRAPHY: 1. Left main coronary is a large caliber vessel with no flow-limiting coronary    artery disease. 2. The left anterior descending artery is a large caliber vessel which has a    20% focal stenosis in its proximal segment.  There is a more distal 40%    diffuse stenosis of the LAD.  The LAD gives rise to a large first    have a proximal 80% diffuse stenosis.  The anterior limb again bifurcates    in a superior and inferior limb.  The superior limb is occluded.  No    collateral flow is visualized. 3. Left circumflex coronary artery is a large caliber vessel.  There is a    large obtuse marginal branch arising from the circumflex coronary artery    within its inferior limb, proximally diffuse 50% stenosis. 4. There is a ramus intermediate branch which is small and appears to be    subtotally occluded.  There is a 99% proximal stenosis.  This vessel    is of a very small caliber. 5. The right coronary artery is codominant.  It has a diffuse 50-60%  stenosis    in its mid segment.  There is a marginal branch which has diffuse 70%    stenosis and there is an ostial lesion in a RV branch which is a small    caliber vessel.  CONCLUSIONS: 1. Multivessel coronary artery disease of the diagonal and right coronary    artery branch, no amenable to percutaneous intervention. 2. Mild left ventricular systolic dysfunction.  RECOMMENDATIONS:  Angiographic images were reviewed with Dr. Juanda Chance.  There are potentially two areas that could be the culprit lesion for the  patients non-Q-wave myocardial infarction.  The ramus intermedius branch is subtotaled as well as the superior limb of diagonal bifurcation that is subtotaled.  Both of these vessels are rather small and are not amenable to percutaneous intervention.  The plan is to treat the patient, continue with medical therapy.  He should be placed on beta blocker therapy, ACE inhibitor  as well as aspirin and consideration should be given to Plavix.  The patient will need a work-up for hypocoagulable state including Factor V Leiden (given the family history), as well as homocystine protein C&S deficiency. DD:  07/21/00 TD:  07/23/00 Job: [id number]

## 2010-10-30 NOTE — Discharge Summary (Signed)
[name]  Patient:    David Cowan, David Cowan                       MRN: [id number] Adm. Date:  [id number] Disc. Date: [id number] Attending:  Lewayne Bunting Dictator:   Brita Romp, P.A. CC:         Candelaria Celeste, M.D.  Rudene Christians. Ladona Ridgel, M.D. [facility]   Discharge Summary  DISCHARGE DIAGNOSES: 1. Coronary artery disease, status post non-Q wave myocardial infarction. 2. Tobacco abuse. 3. Ethanol abuse.  HOSPITAL COURSE: The patient presented to the emergency room complaining of chest pain.  The pain seemed to originate in the epigastrium area radiating to the left arm.  He was seen and admitted by Dr. Lewayne Bunting.  He placed the patient on Lopressor, IV nitroglycerin, heparin, aspirin, and Integrilin.  He noted there was a family history of early coronary disease.  Noted his father died of a heart attack approximately 71 and an uncle at age 68.  There is also a sister with factor V deficiency.  The following morning, the patient was stable and pain-free.  Earlier that day, he was taken to the cath lab by Dr. Lewayne Bunting.  He found the left anterior descending artery to be a large vessel with 20% focal stenosis proximally.  There was also a distal 40% diffuse stenosis.  The first diagonal was noted to have a proximal 80% diffuse stenosis.  Arising off the left circumflex artery was enlargement of two small margin branches which had a proximal 50% diffuse stenosis.  The ramus intermediate branch was small and subtotally occluded with a 99% proximal stenosis.  Of note, this vessel was of very small caliber. Right coronary artery was codominant.  It had diffusely 50-60% stenosis in the mid segment.  There is also marginal branch which had diffuse 70% stenosis.  After consultation with Dr. Edwyna Shell, the decision was made that either the ramus intermedius branch or the superior limb of the diagonal could have been culprit lesions to the non-Q wave MI.  Both of  these vessels were small and not amenable to percutaneous intervention.  Plan is made to treat the patient medically, placed on beta blocker therapy, ACE inhibitor as well as aspirin, and there is a question of whether Plavix should be added.  It is also advised that workup continue for hypocoagulant state.  The next two days, the patient remained stable and pain-free.  On February 9, the patient was seen by Dr. Olga Millers who felt the patient was stable for discharge.  DISCHARGE MEDICATIONS: 1. Toprol XL 50 mg q.d. 2. Altace 2.5 mg q.d. 3. Plavix 75 mg q.d. 4. Zocor 10 mg q.d. 5. Enteric-coated aspirin 325 mg q.d. 6. Folic acid 1 mg q.d. 7. Nitroglycerin p.r.n.  DISCHARGE INSTRUCTIONS:  Patient is advised to avoid heavy lifting, strenuous activity for three days.  Avoid driving for two days.  DIET:  Patient is advised to follow low fat, low cholesterol diet.  WOUND CARE:  Patient was advised to keep the cath site clean and dry and to call for any drainage or lump.  FOLLOWUP:  Patient is to follow up with Dr. Ladona Ridgel on February 27 at 2:30 p.m.  LABORATORY AND ACCESSORY DATA:  White count 10.6, hemoglobin 14.3, hematocrit 42.1.  Platelets 214,000.  Sodium 138, potassium 3.7, chloride 105, CO2 26, BUN 9, creatinine 0.9, glucose 87.  _______ level was 7.66 which was within normal limits.  Peak  cardiac enzymes:  CK 696, MB of 90.4, troponin I 15.81. Total cholesterol 188, triglycerides 194, HDL 37, LDL 112, total cholesterol HDL ratio 5.1.  Factor V activity of 120.  Protein C level 85.  Protein S level 105.  Electrocardiogram revealed normal sinus rhythm at approximately 56.  P.R.N. of 0.174.  PRS 0.100.  QTC of 0.404 with an axis of 67.  There is also a septal infarct pattern. DD:  08/02/00 TD:  08/03/00 Job: [id number]

## 2010-10-30 NOTE — Cardiovascular Report (Signed)
[place]. [facility]  Patient:    David Cowan, David Cowan Visit Number: [id number] MRN: [id number]          Service Type: CAT Location: [facility] 2899 12 Attending Physician:  Rollene Rotunda Dictated by:   Rollene Rotunda, M.D. [facility] Proc. Date: 12/28/01 Admit Date:  12/28/2001 Discharge Date: 12/28/2001   CC:         Ronnald Nian, M.D.   Cardiac Catheterization  PRIMARY PHYSICIAN:  Sharlot Gowda, M.D.  CARDIOLOGIST:  Sharrell Ku, M.D.  PROCEDURE:  Left heart catheterization/coronary arteriography.  INDICATION:  Evaluation of patient with an abnormal cardiolite suggesting anterolateral wall infarct with ischemia.  The patient also had a syncopal episode.  He has had a previous non-Q-wave myocardial infarction.  PROCEDURE NOTE:  Left heart catheterization was performed via the right femoral artery.  The artery was cannulated using anterior wall puncture and a Smart needle.  A #6-French arterial sheath was inserted via the modified Seldinger technique.  Preformed Judkins and a pigtail catheter were utilized. The patient tolerated the procedure well and left the lab in stable condition.  HEMODYNAMIC DATA: 1. LV: 134/18, AO 135/84. 2. Coronaries: The left main had 25% ostial stenosis.  The LAD had long mid    25% stenosis.  The first diagonal was small.  The second diagonal had been    a large vessel with high grade disease in a subbranch.  It is now    occluded at the ostium.  The circumflex in the AV groove had luminal    irregularities.  A large obtuse marginal had a mid 50% stenosis.  This did    not appear to be higher grade than this even after infusion of    intercoronary nitroglycerin.  The right coronary artery was nondominant.    It had a long, mid 70% stenosis.  There was a small RV branch with 99%    proximal stenosis.  LEFT VENTRICULOGRAM:  The left ventriculogram was obtained in the RAO projection.  The EF was 50% with mild anterior, mid, and  lateral hypokinesis.  CONCLUSION: 1. Occlusive diagonal branch coronary disease. 2. Residual obstructive small vessel coronary disease. 3. Well-preserved ejection fraction with a small region of hypokinesis.  PLAN: 1. The patient will continue to have aggressive secondary risk factor    modification.  I have spoken with Dr. Susann Givens.  I reviewed the patients    most recent lipids which demonstrate an LDL of 129 and an HDL of 37.7 on    combination therapy of Zocor and Tri-Chlor.  We will be referring him to    our lipid clinic for further management. 2. The patient will continue to have treatment of hypertension.  He is not    smoking cigarettes. 3. I will refer him back to Dr. Ladona Ridgel to see if there is further need to    evaluate his syncopal episode. Dictated by:   Rollene Rotunda, M.D. [facility] Attending Physician:  Rollene Rotunda DD:  12/28/01 TD:  01/02/02 Job: [id number]

## 2010-12-01 ENCOUNTER — Encounter: Payer: Self-pay | Admitting: Family Medicine

## 2010-12-01 ENCOUNTER — Ambulatory Visit (INDEPENDENT_AMBULATORY_CARE_PROVIDER_SITE_OTHER): Payer: 59 | Admitting: Family Medicine

## 2010-12-01 VITALS — BP 118/82 | HR 80 | Temp 98.3°F | Wt 236.0 lb

## 2010-12-01 DIAGNOSIS — J40 Bronchitis, not specified as acute or chronic: Secondary | ICD-10-CM

## 2010-12-01 MED ORDER — DOXYCYCLINE HYCLATE 100 MG PO CAPS: 100.0000 mg | ORAL_CAPSULE | Freq: Two times a day (BID) | ORAL | Status: DC

## 2010-12-01 MED ORDER — GUAIFENESIN-CODEINE 100-10 MG/5ML PO SYRP: 5.0000 mL | ORAL_SOLUTION | Freq: Every evening | ORAL | Status: DC | PRN

## 2010-12-01 NOTE — Progress Notes (Signed)
  Subjective:    Patient ID: David Cowan, male    DOB: 06-03-1961, 50 y.o.   MRN: [id number]  HPI CC: cough/congestion  9 d ago started with congestion sxs.  Started with ST, sinus congestion.  Has stayed out of work for this.  When blowing nose, gets clear runny mucous out.  Possible fever on Tuesday. Cough mostly dry.  + nasal congestion and RN.  Feels more congested in head than in chest but cough wont' go away.  Has been sleeping in recliner.  Does work outside a lot.  Did have tick exposure.  Denies abd pain, n/v, rashes.  Daughter recently ill as well, pt smoking <1ppd.  No h/o asthma/copd.  Has been taking corsidin since.  Not getting better.  Also taken benadryl for sleep.  Review of Systems Per HPI    Objective:   Physical Exam  Nursing note and vitals reviewed. Constitutional: He appears well-developed and well-nourished. No distress.       congested  HENT:  Head: Normocephalic and atraumatic.  Right Ear: Hearing, tympanic membrane, external ear and ear canal normal.  Left Ear: Hearing, tympanic membrane, external ear and ear canal normal.  Nose: Mucosal edema present. No rhinorrhea. Right sinus exhibits no maxillary sinus tenderness and no frontal sinus tenderness. Left sinus exhibits no maxillary sinus tenderness and no frontal sinus tenderness.  Mouth/Throat: Oropharynx is clear and moist. No oropharyngeal exudate.  Eyes: Conjunctivae and EOM are normal. Pupils are equal, round, and reactive to light. No scleral icterus.  Neck: Normal range of motion. Neck supple. No thyromegaly present.  Cardiovascular: Normal rate, regular rhythm, normal heart sounds and intact distal pulses.   No murmur heard. Pulmonary/Chest: Effort normal and breath sounds normal. No respiratory distress. He has no wheezes. He has no rales.  Lymphadenopathy:    He has no cervical adenopathy.  Skin: Skin is warm and dry. No rash noted.          Assessment & Plan:

## 2010-12-01 NOTE — Patient Instructions (Signed)
I think you have upper respiratory infection. Use nasal saline in nose. Use simple mucinex with plenty of fluid to help mobilize mucous. Cough syrup for cough at night. If not better in next 2-3 days, start antibiotic. Call us if not imporving as expected or any fever >101, worsening shortness of breath or worsening cough. Push fluids and plenty of rest over next few days.

## 2010-12-01 NOTE — Assessment & Plan Note (Addendum)
Could be viral bronchitis, treat supportively, cheratussin for cough at night.   If not improving, add abx. With comorbidities, smoker, treat with doxycycline. Update if not improving as expected

## 2010-12-04 ENCOUNTER — Ambulatory Visit (INDEPENDENT_AMBULATORY_CARE_PROVIDER_SITE_OTHER): Payer: 59 | Admitting: Internal Medicine

## 2010-12-04 ENCOUNTER — Encounter: Payer: Self-pay | Admitting: Internal Medicine

## 2010-12-04 DIAGNOSIS — E785 Hyperlipidemia, unspecified: Secondary | ICD-10-CM

## 2010-12-04 DIAGNOSIS — E782 Mixed hyperlipidemia: Secondary | ICD-10-CM

## 2010-12-04 DIAGNOSIS — I2510 Atherosclerotic heart disease of native coronary artery without angina pectoris: Secondary | ICD-10-CM

## 2010-12-04 DIAGNOSIS — I10 Essential (primary) hypertension: Secondary | ICD-10-CM

## 2010-12-04 NOTE — Patient Instructions (Signed)
Your physician wants you to follow-up in: 12 months with Dr. Taylor. You will receive a reminder letter in the mail two months in advance. If you don't receive a letter, please call our office to schedule the follow-up appointment.    

## 2010-12-04 NOTE — Assessment & Plan Note (Signed)
His cholesterol numbers have been reviewed. He will continue his current cholesterol-lowering medications. I strongly encouraged him to continue to try and lose weight. A low-fat diet has been recommended but he does not necessarily follow it.

## 2010-12-04 NOTE — Assessment & Plan Note (Signed)
His blood pressure is better today with up titration of his medications. I have asked him to maintain a low-sodium diet.

## 2010-12-04 NOTE — Assessment & Plan Note (Signed)
He denies any anginal symptoms. He remains active working a vigorous job. I've encouraged him to continue his current medications, start exercising, lose weight, and continue to not smoke cigarettes.

## 2010-12-04 NOTE — Progress Notes (Signed)
HPI [name] Returns today for followup. He is a very pleasant 50 year old man with a history of hypertension, coronary disease, dyslipidemia, and obesity. The patient is unwell the last year. He continues to work her regular job and has no chest pain or shortness of breath. Recently he was bothered by an upper respiratory infection but this is gradually improved. Allergies  Allergen Reactions  . Amoxicillin     REACTION: rash     Current Outpatient Prescriptions  Medication Sig Dispense Refill  . aspirin 325 MG EC tablet Take 325 mg by mouth daily.        . enalapril (VASOTEC) 10 MG tablet Take 1 tablet (10 mg total) by mouth 2 (two) times daily.  180 tablet  3  . fenofibrate (TRICOR) 145 MG tablet Take 145 mg by mouth daily.        . folic acid (FOLVITE) 1 MG tablet Take 1 mg by mouth daily.        Marland Kitchen guaiFENesin-codeine (ROBITUSSIN AC) 100-10 MG/5ML syrup Take 5 mLs by mouth at bedtime as needed for cough.  240 mL  0  . metoprolol (TOPROL-XL) 50 MG 24 hr tablet Take 50 mg by mouth at bedtime.        . nitroGLYCERIN (NITROSTAT) 0.4 MG SL tablet Place 0.4 mg under the tongue. At onset of chest pain, you may repeat every 5 minutes for up to 3 doses.       . simvastatin (ZOCOR) 40 MG tablet Take 40 mg by mouth at bedtime.        . triamterene-hydrochlorothiazide (MAXZIDE-25) 37.5-25 MG per tablet Take 1 tablet by mouth daily.  30 tablet  6  . DISCONTD: doxycycline (VIBRAMYCIN) 100 MG capsule Take 1 capsule (100 mg total) by mouth 2 (two) times daily.  20 capsule  0     Past Medical History  Diagnosis Date  . CAD (coronary artery disease)   . Hypertension   . Hyperlipidemia     ROS:   All systems reviewed and negative except as noted in the HPI.   Past Surgical History  Procedure Date  . Vasectomy      Family History  Problem Relation Age of Onset  . Coronary artery disease       fam hx, early  . Heart attack Father 9  . Heart attack  40    uncle  . Factor V Leiden  deficiency Sister      History   Social History  . Marital Status: Married    Spouse Name: N/A    Number of Children: 2  . Years of Education: N/A   Occupational History  . SERVICE MANAGER    Social History Main Topics  . Smoking status: Current Everyday Smoker  . Smokeless tobacco: Not on file  . Alcohol Use: Not on file  . Drug Use: Not on file  . Sexually Active: Not on file   Other Topics Concern  . Not on file   Social History Narrative   2 daughters     BP 122/77  Pulse 81  Resp 20  Ht 5\' 11"  (1.803 m)  Wt 233 lb 1.9 oz (105.743 kg)  BMI 32.51 kg/m2  Physical Exam:  Well appearing middle aged man in NAD HEENT: Unremarkable Neck:  No JVD, no thyromegally Lymphatics:  No adenopathy Back:  No CVA tenderness Lungs:  Clear. No wheezes, rales, or rhonchi. HEART:  Regular rate rhythm, no murmurs, no rubs, no clicks Abd:  Obese,  positive bowel sounds, no organomegally, no rebound, no guarding Ext:  2 plus pulses, no edema, no cyanosis, no clubbing Skin:  No rashes no nodules Neuro:  CN II through XII intact, motor grossly intact  EKG Normal sinus rhythm.  Assess/Plan:

## 2010-12-06 ENCOUNTER — Other Ambulatory Visit: Payer: Self-pay | Admitting: Internal Medicine

## 2010-12-08 ENCOUNTER — Other Ambulatory Visit: Payer: Self-pay | Admitting: Family Medicine

## 2010-12-18 ENCOUNTER — Other Ambulatory Visit: Payer: Self-pay | Admitting: Internal Medicine

## 2010-12-23 ENCOUNTER — Encounter: Payer: Self-pay | Admitting: Family Medicine

## 2010-12-23 ENCOUNTER — Other Ambulatory Visit: Payer: Self-pay | Admitting: Family Medicine

## 2010-12-23 ENCOUNTER — Ambulatory Visit (INDEPENDENT_AMBULATORY_CARE_PROVIDER_SITE_OTHER): Payer: 59 | Admitting: Family Medicine

## 2010-12-23 ENCOUNTER — Ambulatory Visit (INDEPENDENT_AMBULATORY_CARE_PROVIDER_SITE_OTHER)
Admission: RE | Admit: 2010-12-23 | Discharge: 2010-12-23 | Disposition: A | Discharge status: Home / Self Care | Payer: 59 | Source: Ambulatory Visit | Attending: Internal Medicine | Admitting: Internal Medicine

## 2010-12-23 VITALS — BP 120/90 | HR 74 | Temp 98.7°F | Ht 70.5 in | Wt 231.5 lb

## 2010-12-23 DIAGNOSIS — R3129 Other microscopic hematuria: Secondary | ICD-10-CM

## 2010-12-23 DIAGNOSIS — M549 Dorsalgia, unspecified: Secondary | ICD-10-CM

## 2010-12-23 DIAGNOSIS — R109 Unspecified abdominal pain: Secondary | ICD-10-CM

## 2010-12-23 LAB — POCT URINALYSIS DIPSTICK
Bilirubin, UA: NEGATIVE
Glucose, UA: NEGATIVE
Ketones, UA: NEGATIVE
Leukocytes, UA: NEGATIVE
Nitrite, UA: NEGATIVE
Spec Grav, UA: 1.01
Urobilinogen, UA: NEGATIVE
pH, UA: 5

## 2010-12-23 NOTE — Progress Notes (Signed)
  Subjective:    Patient ID: David Cowan, male    DOB: 07/07/1960, 50 y.o.   MRN: [id number]  HPI 50 yo here for back pain. Saw Dr. Reece Agar a couple of weeks ago, diagnosed with bronchitis. Given Doxycyline and Cheratussin. Cough has improved but now has right sided flank pain. No dysuria but did feel a little increased urgency. No h/o kidney stones. No fevers, chills or visible hematuria.    Review of Systems Per HPI    Objective:   Physical Exam  Nursing note and vitals reviewed. BP 120/90  Pulse 74  Temp(Src) 98.7 F (37.1 C) (Oral)  Ht 5' 10.5" (1.791 m)  Wt 231 lb 8 oz (105.008 kg)  BMI 32.75 kg/m2  Constitutional: He appears well-developed and well-nourished. No distress.  HENT:  Head: Normocephalic and atraumatic.  Right Ear: Hearing, tympanic membrane, external ear and ear canal normal.  Left Ear: Hearing, tympanic membrane, external ear and ear canal normal.  Nose: . No rhinorrhea. Right sinus exhibits no maxillary sinus tenderness and no frontal sinus tenderness. Left sinus exhibits no maxillary sinus tenderness and no frontal sinus tenderness.  Mouth/Throat: Oropharynx is clear and moist. No oropharyngeal exudate.  Eyes: Conjunctivae and EOM are normal. Pupils are equal, round, and reactive to light. No scleral icterus.  Neck: Normal range of motion. Neck supple. No thyromegaly present.  Cardiovascular: Normal rate, regular rhythm, normal heart sounds and intact distal pulses.   No murmur heard. Abd:  Soft, NT, no CVA tenderness MSK:  Mild tenderness right thoracic paraspinous muscles. Pulmonary/Chest: Effort normal and breath sounds normal. No respiratory distress. He has no wheezes. He has no rales.  Lymphadenopathy:    He has no cervical adenopathy.  Skin: Skin is warm and dry. No rash noted.          Assessment & Plan:   1. Back pain  POCT urinalysis dipstick   New. UA positive for blood. ? Nephrolithiasis. Will order stat CT of abdomen and pelvis  without contrast for further evaluation. The patient indicates understanding of these issues and agrees with the plan.

## 2010-12-23 NOTE — Patient Instructions (Signed)
Please stop by to see David Cowan on your way out. 

## 2010-12-24 NOTE — Progress Notes (Signed)
Addended by: Gilmer Mor on: 12/24/2010 08:10 AM   Modules accepted: Orders

## 2010-12-26 LAB — URINE CULTURE
Colony Count: NO GROWTH
Organism ID, Bacteria: NO GROWTH

## 2010-12-30 ENCOUNTER — Other Ambulatory Visit: Payer: Self-pay | Admitting: Internal Medicine

## 2011-02-08 ENCOUNTER — Other Ambulatory Visit: Payer: Self-pay | Admitting: Family Medicine

## 2011-02-09 ENCOUNTER — Telehealth: Payer: Self-pay | Admitting: Internal Medicine

## 2011-02-09 MED ORDER — FENOFIBRATE 145 MG PO TABS: 145.0000 mg | ORAL_TABLET | Freq: Every day | ORAL | Status: DC

## 2011-02-09 NOTE — Telephone Encounter (Signed)
Pt needs tricor to be sent to [facility].

## 2011-02-12 ENCOUNTER — Other Ambulatory Visit: Payer: Self-pay | Admitting: Internal Medicine

## 2011-02-12 MED ORDER — FENOFIBRATE 145 MG PO TABS: 145.0000 mg | ORAL_TABLET | Freq: Every day | ORAL | Status: DC

## 2011-02-12 NOTE — Telephone Encounter (Signed)
[facility]

## 2011-03-19 ENCOUNTER — Telehealth: Payer: Self-pay | Admitting: Internal Medicine

## 2011-03-19 MED ORDER — FOLIC ACID 1 MG PO TABS: 1.0000 mg | ORAL_TABLET | Freq: Every day | ORAL | Status: DC

## 2011-03-19 NOTE — Telephone Encounter (Signed)
Pt wants refill  tricor and folic acid called in to  [facility] [phone]

## 2011-03-24 ENCOUNTER — Other Ambulatory Visit: Payer: Self-pay | Admitting: *Deleted

## 2011-03-24 MED ORDER — FENOFIBRATE 145 MG PO TABS: 145.0000 mg | ORAL_TABLET | Freq: Every day | ORAL | Status: DC

## 2011-04-02 ENCOUNTER — Telehealth: Payer: Self-pay | Admitting: Internal Medicine

## 2011-04-02 MED ORDER — FENOFIBRATE 145 MG PO TABS: 145.0000 mg | ORAL_TABLET | Freq: Every day | ORAL | Status: DC

## 2011-04-02 NOTE — Telephone Encounter (Signed)
Pt calling needing refill of TRI-COR 160 mg (pt unsure if that is the correct dosage) called into [facility]. Please call this RX in ASAP.  Pt said he called in once before last week and didn't get the refill called in.

## 2011-04-14 ENCOUNTER — Other Ambulatory Visit: Payer: Self-pay | Admitting: Family Medicine

## 2011-04-20 ENCOUNTER — Telehealth: Payer: Self-pay | Admitting: Internal Medicine

## 2011-04-20 NOTE — Telephone Encounter (Signed)
Pt wants refill of [facility]rolol [facility]

## 2011-04-21 MED ORDER — METOPROLOL SUCCINATE ER 50 MG PO TB24: 50.0000 mg | ORAL_TABLET | Freq: Every day | ORAL | Status: DC

## 2011-05-05 ENCOUNTER — Encounter: Payer: Self-pay | Admitting: Family Medicine

## 2011-05-05 ENCOUNTER — Ambulatory Visit (INDEPENDENT_AMBULATORY_CARE_PROVIDER_SITE_OTHER): Payer: 59 | Admitting: Family Medicine

## 2011-05-05 DIAGNOSIS — J069 Acute upper respiratory infection, unspecified: Secondary | ICD-10-CM

## 2011-05-05 DIAGNOSIS — J329 Chronic sinusitis, unspecified: Secondary | ICD-10-CM

## 2011-05-05 MED ORDER — AZITHROMYCIN 250 MG PO TABS: ORAL_TABLET | ORAL | Status: AC

## 2011-05-05 NOTE — Progress Notes (Signed)
SUBJECTIVE:  David Cowan is a 50 y.o. male who complains of coryza, congestion, sneezing, sore throat, myalgias, headache and fever for 8 days. He denies a history of anorexia and chest pain and denies a history of asthma.   Patient Active Problem List  Diagnoses  . DYSLIPIDEMIA  . HYPERTENSION  . CAD  . MYOCARDIAL INFARCTION, HX OF  . HYPERGLYCEMIA  . Routine general medical examination at a health care facility  . Bronchitis  . Right flank pain   Past Medical History  Diagnosis Date  . CAD (coronary artery disease)   . Hypertension   . Hyperlipidemia    Past Surgical History  Procedure Date  . Vasectomy    History  Substance Use Topics  . Smoking status: Current Everyday Smoker  . Smokeless tobacco: Not on file  . Alcohol Use: Not on file   Family History  Problem Relation Age of Onset  . Coronary artery disease       fam hx, early  . Heart attack Father 57  . Heart attack  40    uncle  . Factor V Leiden deficiency Sister    Allergies  Allergen Reactions  . Amoxicillin     REACTION: rash   Current Outpatient Prescriptions on File Prior to Visit  Medication Sig Dispense Refill  . aspirin 325 MG EC tablet Take 325 mg by mouth daily.        . enalapril (VASOTEC) 10 MG tablet Take 1 tablet (10 mg total) by mouth 2 (two) times daily.  180 tablet  3  . fenofibrate (TRICOR) 145 MG tablet Take 1 tablet (145 mg total) by mouth daily.  90 tablet  2  . folic acid (FOLVITE) 1 MG tablet Take 1 tablet (1 mg total) by mouth daily.  90 tablet  1  . metoprolol (TOPROL-XL) 50 MG 24 hr tablet Take 1 tablet (50 mg total) by mouth at bedtime.  90 tablet  3  . nitroGLYCERIN (NITROSTAT) 0.4 MG SL tablet Place 0.4 mg under the tongue. At onset of chest pain, you may repeat every 5 minutes for up to 3 doses.       . simvastatin (ZOCOR) 40 MG tablet TAKE 1 TABLET DAILY AT BEDTIME  90 tablet  3  . triamterene-hydrochlorothiazide (MAXZIDE-25) 37.5-25 MG per tablet TAKE 1 TABLET BY MOUTH  DAILY.  30 tablet  6    OBJECTIVE: BP 110/70  Pulse 72  Temp(Src) 97.8 F (36.6 C) (Oral)  Wt 232 lb (105.235 kg)  He appears well, vital signs are as noted. Ears normal.  Throat and pharynx normal.  Neck supple. No adenopathy in the neck. Nose is congested. Sinuses tender. The chest is clear, without wheezes or rales.  ASSESSMENT:  sinusitis  PLAN: Symptomatic therapy suggested: push fluids, rest and return office visit prn if symptoms persist or worsen.  Call or return to clinic prn if these symptoms worsen or fail to improve as anticipated.

## 2011-05-05 NOTE — Patient Instructions (Signed)
Take antibiotic as directed.  Drink lots of fluids.  Treat sympotmatically with Mucinex, nasal saline irrigation, and Tylenol/Ibuprofen. Also try claritin D or zyrtec D over the counter- two times a day as needed ( have to sign for them at pharmacy). You can use warm compresses.  Cough suppressant at night. Call if not improving as expected in 5-7 days.    

## 2011-07-08 ENCOUNTER — Ambulatory Visit (INDEPENDENT_AMBULATORY_CARE_PROVIDER_SITE_OTHER): Payer: 59 | Admitting: Family Medicine

## 2011-07-08 ENCOUNTER — Encounter: Payer: Self-pay | Admitting: Family Medicine

## 2011-07-08 VITALS — BP 130/80 | HR 88 | Temp 97.5°F | Wt 239.2 lb

## 2011-07-08 DIAGNOSIS — M79609 Pain in unspecified limb: Secondary | ICD-10-CM

## 2011-07-08 DIAGNOSIS — M79643 Pain in unspecified hand: Secondary | ICD-10-CM | POA: Insufficient documentation

## 2011-07-08 NOTE — Patient Instructions (Signed)
Good to see you. We will call you with your blood work results today or tomorrow. Please bring that list of medication back in at your convenience.

## 2011-07-08 NOTE — Progress Notes (Signed)
Subjective:    Patient ID: David Cowan, male    DOB: February 10, 1961, 51 y.o.   MRN: [id number]  HPI  51 yo here for left hand pain.  2 weeks ago, noticed some achy and left hand, became swollen. Not sure if it was red or warm.  Now symptoms have resolved. No known trauma to his hand. Never had anything like this before.  Normal grip strength but it was painful to move his wrist.  Patient Active Problem List  Diagnoses  . DYSLIPIDEMIA  . HYPERTENSION  . CAD  . MYOCARDIAL INFARCTION, HX OF  . HYPERGLYCEMIA  . Routine general medical examination at a health care facility  . Bronchitis  . Right flank pain  . Hand pain   Past Medical History  Diagnosis Date  . CAD (coronary artery disease)   . Hypertension   . Hyperlipidemia    Past Surgical History  Procedure Date  . Vasectomy    History  Substance Use Topics  . Smoking status: Current Everyday Smoker  . Smokeless tobacco: Not on file  . Alcohol Use: Not on file   Family History  Problem Relation Age of Onset  . Coronary artery disease       fam hx, early  . Heart attack Father 19  . Heart attack  40    uncle  . Factor V Leiden deficiency Sister    Allergies  Allergen Reactions  . Amoxicillin     REACTION: rash   Current Outpatient Prescriptions on File Prior to Visit  Medication Sig Dispense Refill  . aspirin 325 MG EC tablet Take 325 mg by mouth daily.        . enalapril (VASOTEC) 10 MG tablet Take 1 tablet (10 mg total) by mouth 2 (two) times daily.  180 tablet  3  . fenofibrate (TRICOR) 145 MG tablet Take 1 tablet (145 mg total) by mouth daily.  90 tablet  2  . folic acid (FOLVITE) 1 MG tablet Take 1 tablet (1 mg total) by mouth daily.  90 tablet  1  . metoprolol (TOPROL-XL) 50 MG 24 hr tablet Take 1 tablet (50 mg total) by mouth at bedtime.  90 tablet  3  . nitroGLYCERIN (NITROSTAT) 0.4 MG SL tablet Place 0.4 mg under the tongue. At onset of chest pain, you may repeat every 5 minutes for up to 3 doses.        . simvastatin (ZOCOR) 40 MG tablet TAKE 1 TABLET DAILY AT BEDTIME  90 tablet  3  . triamterene-hydrochlorothiazide (MAXZIDE-25) 37.5-25 MG per tablet TAKE 1 TABLET BY MOUTH DAILY.  30 tablet  6   The PMH, PSH, Social History, Family History, Medications, and allergies have been reviewed in [facility], and have been updated if relevant.   Review of Systems    See HPI  No fevers No joint swelling other than described in HPI  Objective:   Physical Exam BP 130/80  Pulse 88  Temp(Src) 97.5 F (36.4 C) (Oral)  Wt 239 lb 4 oz (108.523 kg) Gen:  Alert, pleasant, NAD MSK:  No swelling of left hand, FROM without difficulty. Neuro:  Normal grip strength and sensation in hands bilaterally.     Assessment & Plan:   1. Hand pain  Uric acid   New and resolved.  No h/o trauma. Pt is taking diuretics for HTN, will check uric acid to r/o gout. Pt to follow up if symptoms return. The patient indicates understanding of these issues and agrees  with the plan.

## 2011-08-02 ENCOUNTER — Telehealth: Payer: Self-pay | Admitting: Family Medicine

## 2011-08-02 NOTE — Telephone Encounter (Signed)
Pt needs to be evaluated.

## 2011-08-02 NOTE — Telephone Encounter (Signed)
Triage Record Num: [id number] Operator: Tomasita Crumble Patient Name: David Cowan Call Date & Time: 08/02/2011 10:29:54AM Patient Phone: [phone] PCP: Patient Gender: Male PCP Fax : Patient DOB: 02/15/1961 Practice Name: [facility] Reason for Call: Caller: [name]/Patient; PCP: Ruthe Mannan [facility]); CB#: [phone]; Call regarding URI Sx; Onset 07/29/11; Afebrile/subjective. Stuffy nose, Right Ear popping. Denies fever. Denies colored nasal secretions or productive cough. Home care per URI protocol. Caller requests antibiotic to be called in. OFFICE, CALLER REQUESTS RX FOR ANTIBIOTICS SIMILAR TO WHAT HIS DAUGHTER ([name]) WAS GIVEN RECENTLY. CALLBACK NUMBER AS ABOVE. USES [facility] [phone]. Protocol(s) Used: Upper Respiratory Infection (URI) Recommended Outcome per Protocol: Provide Home/Self Care Reason for Outcome: Ear congestion, pressure, fullness Care Advice: ~ Use a cool mist humidifier to moisten air. Be sure to clean according to manufacturer's instructions. Resting or sleeping with head elevated, such as semi-reclining in a recliner, may help reduce inner ear pressure and discomfort. ~ Consider nonprescription decongestant (Sudafed, Drixoral) for relief of symptoms after checking with a provider, especially if there is a history of hypertension, hyperthyroidism, heart disease, diabetes, glaucoma, urinary retention caused by prostatic hypertrophy. ~ Most adults need to drink 6-10 eight-ounce glasses (1.2-2.0 liters) of fluids per day unless previously told to limit fluid intake for other medical reasons. Limit fluids that contain caffeine, sugar or alcohol. Urine will be a very light yellow color when you drink enough fluids. ~ Speak with your provider as soon as possible if: - any temperature elevation in a frail elderly or immunocompromised patient (such as diabetes, HIV/AIDS, renal disease, chemotherapy, organ transplant, or chronic steroid  use). - pregnant and temperature elevation of 100.5 F (38C) or above. - fever does not respond despite 2 doses of fever reducing medication. - fever responds to home care but persists for 3 days or more. ~ 08/02/2011 10:46:00AM Page 1 of 1 [id number]

## 2011-08-03 NOTE — Telephone Encounter (Signed)
Patient notified. He declined appt at this time since Dr. Dayton Martes wasn't in the office. He said he would treat symptoms OTC and if he is still sick next week he will see Dr. Dayton Martes. I advised he could always seek urgent care if worsening. He verbalized understanding.

## 2011-09-14 ENCOUNTER — Other Ambulatory Visit: Payer: Self-pay | Admitting: Internal Medicine

## 2011-09-14 ENCOUNTER — Other Ambulatory Visit: Payer: Self-pay

## 2011-09-14 MED ORDER — FOLIC ACID 1 MG PO TABS: 1.0000 mg | ORAL_TABLET | Freq: Every day | ORAL | Status: DC

## 2011-11-12 ENCOUNTER — Other Ambulatory Visit: Payer: Self-pay | Admitting: Family Medicine

## 2011-12-08 ENCOUNTER — Other Ambulatory Visit: Payer: Self-pay | Admitting: Family Medicine

## 2011-12-21 ENCOUNTER — Ambulatory Visit (INDEPENDENT_AMBULATORY_CARE_PROVIDER_SITE_OTHER): Payer: 59 | Admitting: Internal Medicine

## 2011-12-21 ENCOUNTER — Encounter: Payer: Self-pay | Admitting: Internal Medicine

## 2011-12-21 VITALS — BP 142/82 | HR 70 | Ht 71.0 in | Wt 239.0 lb

## 2011-12-21 DIAGNOSIS — I10 Essential (primary) hypertension: Secondary | ICD-10-CM

## 2011-12-21 DIAGNOSIS — M791 Myalgia, unspecified site: Secondary | ICD-10-CM

## 2011-12-21 DIAGNOSIS — J40 Bronchitis, not specified as acute or chronic: Secondary | ICD-10-CM

## 2011-12-21 DIAGNOSIS — IMO0001 Reserved for inherently not codable concepts without codable children: Secondary | ICD-10-CM

## 2011-12-21 DIAGNOSIS — E785 Hyperlipidemia, unspecified: Secondary | ICD-10-CM

## 2011-12-21 DIAGNOSIS — I2510 Atherosclerotic heart disease of native coronary artery without angina pectoris: Secondary | ICD-10-CM

## 2011-12-21 NOTE — Patient Instructions (Addendum)
Labs today:  TSH, CK  Your physician wants you to follow-up in: 12 months with Dr. Ladona Ridgel. You will receive a reminder letter in the mail two months in advance. If you don't receive a letter, please call our office to schedule the follow-up appointment.

## 2011-12-21 NOTE — Assessment & Plan Note (Signed)
He has had several weeks of nasal discharge. He appears to have developed allergies or cold. As the patient works underneath houses, and as we have had more range, I've asked him to followup with his medical doctor concern about possible fungus exposure.

## 2011-12-21 NOTE — Assessment & Plan Note (Signed)
Today he complains of some muscle achiness. Will check a TSH and muscle enzyme markers.

## 2011-12-21 NOTE — Assessment & Plan Note (Signed)
He denies chest pain. He will continue his current medical therapy. I've encouraged the patient to lose weight.

## 2011-12-21 NOTE — Progress Notes (Signed)
HPI [name] returns today for followup. He is a very pleasant 51 year old man with coronary artery disease status post non-ST elevation MI years ago. He continues to be bothered by obesity and hypertension. Despite this he denies chest pain shortness of breath or peripheral edema. He admits to dietary indiscretion. He has had some nasal drainage. No fever or chills. He also complains of muscle achiness. Allergies  Allergen Reactions  . Amoxicillin     REACTION: rash  . Penicillins      Current Outpatient Prescriptions  Medication Sig Dispense Refill  . aspirin 325 MG EC tablet Take 325 mg by mouth daily.        . enalapril (VASOTEC) 10 MG tablet TAKE 1 TABLET TWICE A DAY  180 tablet  2  . fenofibrate (TRICOR) 145 MG tablet Take 1 tablet (145 mg total) by mouth daily.  90 tablet  2  . folic acid (FOLVITE) 1 MG tablet Take 1 tablet (1 mg total) by mouth daily.  90 tablet  1  . metoprolol (TOPROL-XL) 50 MG 24 hr tablet Take 1 tablet (50 mg total) by mouth at bedtime.  90 tablet  3  . nitroGLYCERIN (NITROSTAT) 0.4 MG SL tablet Place 0.4 mg under the tongue. At onset of chest pain, you may repeat every 5 minutes for up to 3 doses.       . simvastatin (ZOCOR) 40 MG tablet TAKE 1 TABLET DAILY AT BEDTIME  90 tablet  3  . triamterene-hydrochlorothiazide (MAXZIDE-25) 37.5-25 MG per tablet TAKE 1 TABLET BY MOUTH DAILY.  30 tablet  6     Past Medical History  Diagnosis Date  . CAD (coronary artery disease)   . Hypertension   . Hyperlipidemia     ROS:   All systems reviewed and negative except as noted in the HPI.   Past Surgical History  Procedure Date  . Vasectomy      Family History  Problem Relation Age of Onset  . Coronary artery disease       fam hx, early  . Heart attack Father 37  . Heart attack  40    uncle  . Factor V Leiden deficiency Sister      History   Social History  . Marital Status: Married    Spouse Name: N/A    Number of Children: 2  . Years of  Education: N/A   Occupational History  . SERVICE MANAGER    Social History Main Topics  . Smoking status: Current Everyday Smoker  . Smokeless tobacco: Not on file  . Alcohol Use: Not on file  . Drug Use: Not on file  . Sexually Active: Not on file   Other Topics Concern  . Not on file   Social History Narrative   2 daughters     BP 142/82  Pulse 70  Ht 5\' 11"  (1.803 m)  Wt 239 lb (108.41 kg)  BMI 33.33 kg/m2  Physical Exam:  Well appearing middle-aged man, NAD HEENT: Unremarkable Neck:  No JVD, no thyromegally Lungs:  Clear with scattered rales but no wheezes or rhonchi. HEART:  Regular rate rhythm, no murmurs, no rubs, no clicks Abd:  soft, positive bowel sounds, no organomegally, no rebound, no guarding Ext:  2 plus pulses, no edema, no cyanosis, no clubbing Skin:  No rashes no nodules Neuro:  CN II through XII intact, motor grossly intact  EKG Normal sinus rhythm normal axis and intervals   Assess/Plan:

## 2011-12-21 NOTE — Assessment & Plan Note (Signed)
His blood pressure is minimally elevated. I've asked him to lose weight, continue his current medications, and reduce his salt intake in his diet.

## 2011-12-26 NOTE — Progress Notes (Signed)
TSH ok, ?CK? No results.

## 2011-12-31 ENCOUNTER — Telehealth: Payer: Self-pay | Admitting: Internal Medicine

## 2011-12-31 DIAGNOSIS — M7918 Myalgia, other site: Secondary | ICD-10-CM

## 2011-12-31 NOTE — Telephone Encounter (Signed)
New msg Pt wants to discuss lab results

## 2011-12-31 NOTE — Telephone Encounter (Signed)
Is needing cholesterol medication refilled to [facility].  Total CK was drawn on 7/9 however it was put in under [facility] and not [facility].  Patient stated that Total CK ordered to see if any muscle damage from cholesterol medication. Tried to call patient back to let him know needs to be redrawn, had to leave message.  Did not refill Simvastatin at this time since patient gets mail order 90 days at a time.  When patient calls back will have him come ASAP for labs

## 2012-01-03 NOTE — Telephone Encounter (Signed)
Fu call °Pt returning your call  °

## 2012-01-03 NOTE — Telephone Encounter (Signed)
Patient is going to call when he gets back from vacation and will call us around 01/17/12.  He will need to come back in and have his labs repeated.  He is willing to come in and have his labs repeated when he returns.

## 2012-03-12 ENCOUNTER — Other Ambulatory Visit: Payer: Self-pay | Admitting: Internal Medicine

## 2012-03-19 ENCOUNTER — Telehealth: Payer: Self-pay | Admitting: Internal Medicine

## 2012-03-20 ENCOUNTER — Other Ambulatory Visit: Payer: Self-pay | Admitting: *Deleted

## 2012-03-20 MED ORDER — FENOFIBRATE 145 MG PO TABS: 145.0000 mg | ORAL_TABLET | Freq: Every day | ORAL | Status: DC

## 2012-03-20 NOTE — Telephone Encounter (Signed)
Refilled Fenofibrate. 

## 2012-03-22 ENCOUNTER — Telehealth: Payer: Self-pay | Admitting: Cardiology

## 2012-03-22 MED ORDER — SIMVASTATIN 40 MG PO TABS: 40.0000 mg | ORAL_TABLET | Freq: Every day | ORAL | Status: DC

## 2012-03-22 NOTE — Telephone Encounter (Signed)
Called patient because we have no record of him every being on Crestor. He said he had been on the same medicine since 2002. I told him that our records show that he is on Simvastatin and not Crestor he said that was the correct medicine and he had the name wrong. I told him that I went on and [facility]

## 2012-03-22 NOTE — Telephone Encounter (Signed)
New Problem:    Patient called in needing a refill of his Crestor.  Please call back if you have any questions.

## 2012-07-31 ENCOUNTER — Other Ambulatory Visit: Payer: Self-pay | Admitting: Family Medicine

## 2012-08-15 ENCOUNTER — Other Ambulatory Visit: Payer: Self-pay | Admitting: Internal Medicine

## 2012-08-23 ENCOUNTER — Other Ambulatory Visit: Payer: Self-pay | Admitting: *Deleted

## 2012-08-23 MED ORDER — ENALAPRIL MALEATE 10 MG PO TABS: ORAL_TABLET | ORAL | Status: DC

## 2012-09-03 ENCOUNTER — Other Ambulatory Visit: Payer: Self-pay | Admitting: Family Medicine

## 2012-11-15 ENCOUNTER — Other Ambulatory Visit: Payer: Self-pay | Admitting: Family Medicine

## 2012-12-25 ENCOUNTER — Other Ambulatory Visit: Payer: Self-pay | Admitting: *Deleted

## 2012-12-25 MED ORDER — TRIAMTERENE-HCTZ 37.5-25 MG PO TABS: ORAL_TABLET | ORAL | Status: DC

## 2013-01-05 ENCOUNTER — Ambulatory Visit (INDEPENDENT_AMBULATORY_CARE_PROVIDER_SITE_OTHER): Payer: 59 | Admitting: Physician Assistant

## 2013-01-05 ENCOUNTER — Encounter: Payer: Self-pay | Admitting: Physician Assistant

## 2013-01-05 ENCOUNTER — Telehealth: Payer: Self-pay | Admitting: *Deleted

## 2013-01-05 VITALS — BP 160/89 | HR 83 | Ht 71.0 in | Wt 246.0 lb

## 2013-01-05 DIAGNOSIS — I2510 Atherosclerotic heart disease of native coronary artery without angina pectoris: Secondary | ICD-10-CM

## 2013-01-05 DIAGNOSIS — F17200 Nicotine dependence, unspecified, uncomplicated: Secondary | ICD-10-CM

## 2013-01-05 DIAGNOSIS — I10 Essential (primary) hypertension: Secondary | ICD-10-CM

## 2013-01-05 DIAGNOSIS — E785 Hyperlipidemia, unspecified: Secondary | ICD-10-CM

## 2013-01-05 DIAGNOSIS — M791 Myalgia, unspecified site: Secondary | ICD-10-CM

## 2013-01-05 DIAGNOSIS — IMO0001 Reserved for inherently not codable concepts without codable children: Secondary | ICD-10-CM

## 2013-01-05 DIAGNOSIS — Z720 Tobacco use: Secondary | ICD-10-CM

## 2013-01-05 LAB — HEPATIC FUNCTION PANEL
Albumin: 4 g/dL (ref 3.5–5.2)
Alkaline Phosphatase: 48 U/L (ref 39–117)
Bilirubin, Direct: 0 mg/dL (ref 0.0–0.3)

## 2013-01-05 LAB — LIPID PANEL
HDL: 29.3 mg/dL — ABNORMAL LOW (ref >39.00)
Total CHOL/HDL Ratio: 6
VLDL: 82 mg/dL — ABNORMAL HIGH (ref 0.0–40.0)

## 2013-01-05 MED ORDER — METOPROLOL SUCCINATE ER 100 MG PO TB24: ORAL_TABLET | ORAL | Status: DC

## 2013-01-05 MED ORDER — NITROGLYCERIN 0.4 MG SL SUBL: 0.4000 mg | SUBLINGUAL_TABLET | SUBLINGUAL | Status: DC | PRN

## 2013-01-05 NOTE — Telephone Encounter (Signed)
Message copied by Tarri Fuller on Fri Jan 05, 2013  5:16 PM ------      Message from: [place], [place] T      Created: Fri Jan 05, 2013  4:46 PM       CPK normal      LFTs ok      Trigs way too high      Is he taking simvastatin 40 mg QD and Tricor 145 mg QD??      If taking both - Start Lovaza 1 gm bid and check L/L in 3 mos      If not taking both, start taking both every day and repeat L/L in 3 mos      Tereso Newcomer, PA-C        01/05/2013 4:46 PM         ------

## 2013-01-05 NOTE — Telephone Encounter (Signed)
pt notified about all lab results and states he maybe doesn't take the simvastatin a couple of times a week when he has a beer or two. I advised to make sure to take both Tricor and Simvastatin daily, FLP/LFT 04/12/13, mailed results to pt per pt request

## 2013-01-05 NOTE — Patient Instructions (Addendum)
INCREASE TOPROL XL 100 MG DAILY, NEW REFILL WAS SENT IN TODAY FOR THE 100 MG TABLET  A REFILL WAS ALSO SENT IN FOR NTG TODAY  LABS TODAY; BMET, TOTAL CK AND FLP, LFT  Your physician wants you to follow-up in: 1 YEAR WITH DR. Ladona Ridgel. You will receive a reminder letter in the mail two months in advance. If you don't receive a letter, please call our office to schedule the follow-up appointment.

## 2013-01-05 NOTE — Progress Notes (Signed)
[address] [place], [place]  [address] Phone: [phone] Fax:  [phone]  Date:  01/05/2013   ID:  Thane, Age 18-Feb-1961, MRN [id number]  PCP:  Ruthe Mannan, MD  Cardiologist:  Dr. Lewayne Bunting     History of Present Illness: David Cowan is a 52 y.o. male who returns for follow up.  He has a hx of CAD, HTN, HL.  He suffered a NSTEMI in 07/2000 that was treated medically (no targets for PCI).  Last LHC 7/03: oLM 25%, mLAD 25%, oD2 occluded, mOM50%, pRV branch 99%, EF 50% with ant and lat HK.  Med Rx recommended.  Last seen by Dr. Lewayne Bunting 12/2011.  Since last being seen, he denies significant chest pain, dyspnea, syncope, PND, edema. He sleeps in a recliner because of nasal congestion and allergy symptoms.  Labs (5/12):  K 4.9, Cr 1.1, LDL 114.3  Labs (7/13):  TSH 2.90  Wt Readings from Last 3 Encounters:  01/05/13 246 lb (111.585 kg)  12/21/11 239 lb (108.41 kg)  07/08/11 239 lb 4 oz (108.523 kg)     Past Medical History  Diagnosis Date  . CAD (coronary artery disease)   . Hypertension   . Hyperlipidemia     Current Outpatient Prescriptions  Medication Sig Dispense Refill  . aspirin 325 MG EC tablet Take 325 mg by mouth daily.        . enalapril (VASOTEC) 10 MG tablet TAKE 1 TABLET TWICE A DAY  180 tablet  0  . fenofibrate (TRICOR) 145 MG tablet Take 1 tablet (145 mg total) by mouth daily.  90 tablet  3  . folic acid (FOLVITE) 1 MG tablet TAKE 1 TABLET DAILY  90 tablet  3  . metoprolol succinate (TOPROL-XL) 50 MG 24 hr tablet TAKE 1 TABLET AT BEDTIME  90 tablet  2  . nitroGLYCERIN (NITROSTAT) 0.4 MG SL tablet Place 0.4 mg under the tongue. At onset of chest pain, you may repeat every 5 minutes for up to 3 doses.       . simvastatin (ZOCOR) 40 MG tablet Take 1 tablet (40 mg total) by mouth at bedtime.  90 tablet  3  . triamterene-hydrochlorothiazide (MAXZIDE-25) 37.5-25 MG per tablet TAKE 1 TABLET BY MOUTH DAILY.  30 tablet  0   No  current facility-administered medications for this visit.    Allergies:    Allergies  Allergen Reactions  . Amoxicillin     REACTION: rash  . Penicillins     Social History:  The patient  reports that he has been smoking.  He does not have any smokeless tobacco history on file.   ROS:  Please see the history of present illness.   He had a recent URI which has resolved.   He still has low level myalgias. Total CPK was ordered last year but never performed.  All other systems reviewed and negative.   PHYSICAL EXAM: VS:  BP 160/89  Pulse 83  Ht 5\' 11"  (1.803 m)  Wt 246 lb (111.585 kg)  BMI 34.33 kg/m2 Well nourished, well developed, in no acute distress HEENT: normal Neck: no JVD Vascular: No carotid bruits bilaterally2 Cardiac:  normal S1, S2; RRR; no murmur Lungs:  clear to auscultation bilaterally, no wheezing, rhonchi or rales Abd: soft, nontender, no hepatomegaly Ext: no edema Skin: warm and dry Neuro:  CNs 2-12 intact, no focal abnormalities noted  EKG:  NSR, HR 83, normal axis, no ST changes  ASSESSMENT AND PLAN:  1. CAD:  No angina. Continue aspirin and statin. 2. Myalgias:  He has a fairly strenuous job. I suspect this is mostly muscle fatigue. CPK was never obtained last year. I will go ahead and obtain a total CPK today. 3. Hypertension:  Uncontrolled. Adjust Toprol XL to 100 mg daily. Check basic metabolic panel today. 4. Hyperlipidemia:  We discussed possibly changing statin medications. At this point, we will continue with simvastatin 40 mg daily. Check a total CPK as noted. Obtain lipids and LFTs. 5. Tobacco Abuse:  We discussed the importance of cessation and different strategies for quitting.    6. Disposition:  F/u in 1 year with Dr. Lewayne Bunting.   Signed, Tereso Newcomer, PA-C  01/05/2013 9:15 AM

## 2013-01-21 ENCOUNTER — Other Ambulatory Visit: Payer: Self-pay | Admitting: Internal Medicine

## 2013-01-22 ENCOUNTER — Other Ambulatory Visit: Payer: Self-pay | Admitting: Internal Medicine

## 2013-02-04 ENCOUNTER — Other Ambulatory Visit: Payer: Self-pay | Admitting: Family Medicine

## 2013-04-13 ENCOUNTER — Other Ambulatory Visit: Payer: 59

## 2013-04-20 ENCOUNTER — Other Ambulatory Visit: Payer: Self-pay | Admitting: Internal Medicine

## 2013-05-16 ENCOUNTER — Other Ambulatory Visit: Payer: Self-pay | Admitting: Family Medicine

## 2013-05-17 NOTE — Telephone Encounter (Signed)
Electronic refill request.  Looks like this patient was warned at last RF that OV was needed.  Please advise.

## 2013-05-17 NOTE — Telephone Encounter (Signed)
Denied, needs to schedule OV.

## 2013-06-04 NOTE — Telephone Encounter (Signed)
Pt called for refill enalapril; advised pt needs to be seen; pt also having prod cough with yellow phlegm. Pt scheduled appt with Nicki Reaper NP 06/05/13 at 3:45 pm; 1st appt pt could schedule.

## 2013-06-05 ENCOUNTER — Ambulatory Visit (INDEPENDENT_AMBULATORY_CARE_PROVIDER_SITE_OTHER): Payer: 59 | Admitting: Internal Medicine

## 2013-06-05 ENCOUNTER — Encounter: Payer: Self-pay | Admitting: Internal Medicine

## 2013-06-05 VITALS — BP 168/86 | HR 92 | Temp 98.2°F | Ht 70.0 in | Wt 251.2 lb

## 2013-06-05 DIAGNOSIS — I10 Essential (primary) hypertension: Secondary | ICD-10-CM

## 2013-06-05 DIAGNOSIS — J42 Unspecified chronic bronchitis: Secondary | ICD-10-CM

## 2013-06-05 MED ORDER — ENALAPRIL MALEATE 10 MG PO TABS: ORAL_TABLET | ORAL | Status: DC

## 2013-06-05 MED ORDER — PREDNISONE 10 MG PO TABS: ORAL_TABLET | ORAL | Status: DC

## 2013-06-05 NOTE — Patient Instructions (Signed)

## 2013-06-05 NOTE — Progress Notes (Signed)
Pre-visit discussion using our clinic review tool. No additional management support is needed unless otherwise documented below in the visit note.  
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
## 2013-06-05 NOTE — Assessment & Plan Note (Signed)
Elevated today but he has been out of his medication Will refill with return office visit 1 month to recheck BP

## 2013-06-12 ENCOUNTER — Ambulatory Visit: Payer: 59 | Admitting: Internal Medicine

## 2013-07-02 ENCOUNTER — Other Ambulatory Visit: Payer: Self-pay | Admitting: Internal Medicine

## 2013-07-06 ENCOUNTER — Other Ambulatory Visit: Payer: Self-pay

## 2013-07-06 MED ORDER — TRIAMTERENE-HCTZ 37.5-25 MG PO TABS: ORAL_TABLET | ORAL | Status: DC

## 2013-09-11 ENCOUNTER — Other Ambulatory Visit: Payer: Self-pay | Admitting: Internal Medicine

## 2013-09-26 ENCOUNTER — Other Ambulatory Visit: Payer: Self-pay | Admitting: Internal Medicine

## 2013-10-02 ENCOUNTER — Other Ambulatory Visit: Payer: Self-pay

## 2013-10-02 NOTE — Telephone Encounter (Signed)
Pt left v/m requesting [facility]. Pt request cb. Pt last seen for CPX 10/24/10 and last sick visit 06/05/13.No future appt scheduled.Please advise.pt out of med for 10 days.

## 2013-10-02 NOTE — Telephone Encounter (Signed)
He should request rx from cardiology since they saw him for HTN in 12/2012 and I have not seen him for years for this.

## 2013-10-02 NOTE — Telephone Encounter (Signed)
Spoke to pt and advised him that per Dr Deborra Medina he will need to contact cardiologist. Pt states that they told him to contact PCP.Per Dr Deborra Medina, he will need to have a f/u appt in order to receive refill. She will not refill until after ov as he has not been seen by her since 2012.

## 2013-10-09 ENCOUNTER — Other Ambulatory Visit: Payer: Self-pay

## 2013-10-09 MED ORDER — ENALAPRIL MALEATE 10 MG PO TABS: ORAL_TABLET | ORAL | Status: DC

## 2013-10-11 ENCOUNTER — Other Ambulatory Visit: Payer: Self-pay | Admitting: *Deleted

## 2013-10-11 MED ORDER — ENALAPRIL MALEATE 10 MG PO TABS: ORAL_TABLET | ORAL | Status: DC

## 2013-10-11 NOTE — Telephone Encounter (Signed)
Pt called back to let Dr Deborra Medina know that cardiology did not call pt back and when pts wife contacted cardiology she was told cardiology would refill but could be 48 hrs before refill could be done. Pt said he has been out of med for 2 weeks and has been "bounced" back and forth between our office and the cardiology office. I explained that since pt had been seen at cardiology for HTN in 2014 that was why it was recommended that pt contact cardiology office. Pt voiced understanding and said he had appt to see cardiologist in July for yearly exam.

## 2013-11-07 ENCOUNTER — Other Ambulatory Visit: Payer: Self-pay

## 2013-11-07 MED ORDER — ENALAPRIL MALEATE 10 MG PO TABS: ORAL_TABLET | ORAL | Status: DC

## 2013-12-19 ENCOUNTER — Encounter: Payer: Self-pay | Admitting: Internal Medicine

## 2013-12-19 ENCOUNTER — Ambulatory Visit (INDEPENDENT_AMBULATORY_CARE_PROVIDER_SITE_OTHER): Payer: 59 | Admitting: Internal Medicine

## 2013-12-19 VITALS — BP 117/71 | HR 79 | Ht 70.0 in | Wt 245.0 lb

## 2013-12-19 DIAGNOSIS — E785 Hyperlipidemia, unspecified: Secondary | ICD-10-CM

## 2013-12-19 DIAGNOSIS — I10 Essential (primary) hypertension: Secondary | ICD-10-CM

## 2013-12-19 DIAGNOSIS — Z720 Tobacco use: Secondary | ICD-10-CM | POA: Insufficient documentation

## 2013-12-19 DIAGNOSIS — F17200 Nicotine dependence, unspecified, uncomplicated: Secondary | ICD-10-CM

## 2013-12-19 DIAGNOSIS — I2510 Atherosclerotic heart disease of native coronary artery without angina pectoris: Secondary | ICD-10-CM

## 2013-12-19 LAB — LIPID PANEL
CHOL/HDL RATIO: 6
CHOLESTEROL: 167 mg/dL (ref 0–200)
HDL: 28.6 mg/dL — ABNORMAL LOW (ref >39.00)
LDL CALC: 70 mg/dL (ref 0–99)
NonHDL: 138.4
Triglycerides: 344 mg/dL — ABNORMAL HIGH (ref 0.0–149.0)
VLDL: 68.8 mg/dL — ABNORMAL HIGH (ref 0.0–40.0)

## 2013-12-19 LAB — TSH: TSH: 1.73 u[IU]/mL (ref 0.35–4.50)

## 2013-12-19 NOTE — Progress Notes (Signed)
HPI David Cowan returns today for followup. He is a pleasant 53 yo man with CAD, s/p MI, HTN, ongoing tobacco abuse and dyslipidemia. In the interim, he c/o leg pain but no chest pain or sob. He has been unable to gain weight. His blood pressure is improved.  Allergies  Allergen Reactions  . Amoxicillin     REACTION: rash  . Penicillins      Current Outpatient Prescriptions  Medication Sig Dispense Refill  . aspirin 325 MG EC tablet Take 325 mg by mouth daily.        . enalapril (VASOTEC) 10 MG tablet Take one tablet by mouth twice a day.  180 tablet  0  . fenofibrate (TRICOR) 145 MG tablet TAKE 1 TABLET DAILY  90 tablet  2  . fluticasone (FLONASE) 50 MCG/ACT nasal spray Place 2 sprays into both nostrils as needed for allergies or rhinitis.      . folic acid (FOLVITE) 1 MG tablet TAKE 1 TABLET DAILY  90 tablet  0  . metoprolol succinate (TOPROL-XL) 100 MG 24 hr tablet TAKE 1 TABLET AT BEDTIME  90 tablet  3  . nitroGLYCERIN (NITROSTAT) 0.4 MG SL tablet Place 1 tablet (0.4 mg total) under the tongue every 5 (five) minutes as needed for chest pain.  25 tablet  3  . simvastatin (ZOCOR) 40 MG tablet TAKE 1 TABLET AT BEDTIME  90 tablet  2  . triamterene-hydrochlorothiazide (MAXZIDE-25) 37.5-25 MG per tablet TAKE 1 TABLET BY MOUTH DAILY  90 tablet  3   No current facility-administered medications for this visit.     Past Medical History  Diagnosis Date  . CAD (coronary artery disease)   . Hypertension   . Hyperlipidemia   . History of MI (myocardial infarction)   . Bronchitis   . Dyslipidemia   . Right flank pain   . Hand pain     ROS:   All systems reviewed and negative except as noted in the HPI.   Past Surgical History  Procedure Laterality Date  . Vasectomy       Family History  Problem Relation Age of Onset  . Coronary artery disease       fam hx, early  . Heart attack Father 66  . Heart attack  40    uncle  . Factor V Leiden deficiency Sister       History   Social History  . Marital Status: Married    Spouse Name: N/A    Number of Children: 2  . Years of Education: N/A   Occupational History  . SERVICE MANAGER    Social History Main Topics  . Smoking status: Current Every Day Smoker  . Smokeless tobacco: Not on file  . Alcohol Use: Not on file  . Drug Use: Not on file  . Sexual Activity: Not on file   Other Topics Concern  . Not on file   Social History Narrative   2 daughters     BP 117/71  Pulse 79  Ht 5\' 10"  (1.778 m)  Wt 245 lb (111.131 kg)  BMI 35.15 kg/m2  Physical Exam:  Well appearing obese middle aged man, NAD HEENT: Unremarkable Neck:  No JVD, no thyromegally Back:  No CVA tenderness Lungs:  Clear with no wheezes HEART:  Regular rate rhythm, no murmurs, no rubs, no clicks Abd:  soft, positive bowel sounds, obese, no organomegally, no rebound, no guarding Ext:  2 plus pulses, no edema, no cyanosis,  no clubbing Skin:  No rashes no nodules Neuro:  CN II through XII intact, motor grossly intact  EKG - nsr   Assess/Plan:

## 2013-12-19 NOTE — Assessment & Plan Note (Signed)
He will continue his statin and fibrate. Will check fasting lipids today.

## 2013-12-19 NOTE — Patient Instructions (Signed)
Your physician recommends that you continue on your current medications as directed. Please refer to the Current Medication list given to you today.  Labs today: Fasting lipid profile, TSH  Your physician wants you to follow-up in: 1 year with [name].  You will receive a reminder letter in the mail two months in advance. If you don't receive a letter, please call our office to schedule the follow-up appointment.

## 2013-12-19 NOTE — Assessment & Plan Note (Signed)
He denies anginal symptoms. He will continue his current meds.  

## 2013-12-19 NOTE — Assessment & Plan Note (Signed)
I have strongly encouraged the patient to stop smoking. He states that he will try.

## 2013-12-19 NOTE — Progress Notes (Deleted)
[facility] [address], [address] [place], [place]  [address] Phone: [phone] Fax:  [phone]  Date:  12/19/2013   ID:  Shakir, Petrosino February 19, 1961, MRN [id number]  PCP:  Arnette Norris, MD  Cardiologist:  Dr. Cristopher Peru     History of Present Illness: LADARIEN BEEKS is a 53 y.o. male who returns for follow up.  He has a hx of CAD, HTN, HL.  He suffered a NSTEMI in 07/2000 that was treated medically (no targets for PCI).  Last LHC 7/03: oLM 25%, mLAD 25%, oD2 occluded, mOM50%, pRV branch 99%, EF 50% with ant and lat HK.  Med Rx recommended.  Last seen by me 12/2011.  Since last being seen, he denies significant chest pain, dyspnea, syncope, PND, edema. He sleeps in a recliner because of nasal congestion and allergy symptoms.  Labs (5/12):  K 4.9, Cr 1.1, LDL 114.3  Labs (7/13):  TSH 2.90  Wt Readings from Last 3 Encounters:  06/05/13 251 lb 4 oz (113.966 kg)  01/05/13 246 lb (111.585 kg)  12/21/11 239 lb (108.41 kg)     Past Medical History  Diagnosis Date  . CAD (coronary artery disease)   . Hypertension   . Hyperlipidemia   . History of MI (myocardial infarction)   . Bronchitis   . Dyslipidemia   . Right flank pain   . Hand pain     Current Outpatient Prescriptions  Medication Sig Dispense Refill  . aspirin 325 MG EC tablet Take 325 mg by mouth daily.        . enalapril (VASOTEC) 10 MG tablet Take one tablet by mouth twice a day.  180 tablet  0  . fenofibrate (TRICOR) 145 MG tablet TAKE 1 TABLET DAILY  90 tablet  2  . folic acid (FOLVITE) 1 MG tablet TAKE 1 TABLET DAILY  90 tablet  0  . metoprolol succinate (TOPROL-XL) 100 MG 24 hr tablet TAKE 1 TABLET AT BEDTIME  90 tablet  3  . nitroGLYCERIN (NITROSTAT) 0.4 MG SL tablet Place 1 tablet (0.4 mg total) under the tongue every 5 (five) minutes as needed for chest pain.  25 tablet  3  . predniSONE (DELTASONE) 10 MG tablet Take 3 tabs on days 1-2, take 2 tabs on days 3-4, take 1 tab on days 5-6  12 tablet  0  .  simvastatin (ZOCOR) 40 MG tablet TAKE 1 TABLET AT BEDTIME  90 tablet  2  . triamterene-hydrochlorothiazide (MAXZIDE-25) 37.5-25 MG per tablet TAKE 1 TABLET BY MOUTH DAILY  90 tablet  3   No current facility-administered medications for this visit.    Allergies:    Allergies  Allergen Reactions  . Amoxicillin     REACTION: rash  . Penicillins     Social History:  The patient  reports that he has been smoking.  He does not have any smokeless tobacco history on file.   ROS:  Please see the history of present illness.   He had a recent URI which has resolved.   He still has low level myalgias. Total CPK was ordered last year but never performed.  All other systems reviewed and negative.   PHYSICAL EXAM: VS:  There were no vitals taken for this visit. Well nourished, well developed, in no acute distress HEENT: normal Neck: no JVD Vascular: No carotid bruits bilaterally2 Cardiac:  normal S1, S2; RRR; no murmur Lungs:  clear to auscultation bilaterally, no wheezing, rhonchi or rales Abd: soft, nontender,  no hepatomegaly Ext: no edema Skin: warm and dry Neuro:  CNs 2-12 intact, no focal abnormalities noted  EKG:  NSR, HR 83, normal axis, no ST changes     ASSESSMENT AND PLAN:  1. CAD:  No angina. Continue aspirin and statin. 2. Myalgias:  He has a fairly strenuous job. I suspect this is mostly muscle fatigue. CPK was never obtained last year. I will go ahead and obtain a total CPK today. 3. Hypertension:  Uncontrolled. Adjust Toprol XL to 100 mg daily. Check basic metabolic panel today. 4. Hyperlipidemia:  We discussed possibly changing statin medications. At this point, we will continue with simvastatin 40 mg daily. Check a total CPK as noted. Obtain lipids and LFTs. 5. Tobacco Abuse:  We discussed the importance of cessation and different strategies for quitting.    6. Disposition:  F/u in 1 year with Dr. Cristopher Peru.   Signed, Richardson Dopp, PA-C  12/19/2013 8:47 AM

## 2013-12-24 ENCOUNTER — Other Ambulatory Visit: Payer: Self-pay | Admitting: Internal Medicine

## 2014-01-03 ENCOUNTER — Telehealth: Payer: Self-pay | Admitting: Internal Medicine

## 2014-01-03 DIAGNOSIS — I10 Essential (primary) hypertension: Secondary | ICD-10-CM

## 2014-01-03 MED ORDER — ENALAPRIL MALEATE 10 MG PO TABS: ORAL_TABLET | ORAL | Status: DC

## 2014-01-03 MED ORDER — FENOFIBRATE 145 MG PO TABS: ORAL_TABLET | ORAL | Status: DC

## 2014-01-03 MED ORDER — FOLIC ACID 1 MG PO TABS: ORAL_TABLET | ORAL | Status: DC

## 2014-01-03 MED ORDER — METOPROLOL SUCCINATE ER 100 MG PO TB24: ORAL_TABLET | ORAL | Status: DC

## 2014-01-03 MED ORDER — SIMVASTATIN 40 MG PO TABS: ORAL_TABLET | ORAL | Status: DC

## 2014-01-03 NOTE — Telephone Encounter (Signed)
New message ° ° ° ° °Want lab results °

## 2014-01-03 NOTE — Telephone Encounter (Signed)
Patient aware of results.

## 2014-02-19 ENCOUNTER — Other Ambulatory Visit: Payer: Self-pay | Admitting: Internal Medicine

## 2014-02-25 ENCOUNTER — Other Ambulatory Visit: Payer: Self-pay | Admitting: Internal Medicine

## 2014-02-25 ENCOUNTER — Encounter: Payer: Self-pay | Admitting: Internal Medicine

## 2014-03-03 ENCOUNTER — Other Ambulatory Visit: Payer: Self-pay | Admitting: Internal Medicine

## 2014-03-27 ENCOUNTER — Encounter: Payer: Self-pay | Admitting: Gastroenterology

## 2014-03-27 ENCOUNTER — Encounter: Payer: Self-pay | Admitting: Family Medicine

## 2014-03-27 ENCOUNTER — Ambulatory Visit (INDEPENDENT_AMBULATORY_CARE_PROVIDER_SITE_OTHER): Payer: 59 | Admitting: Family Medicine

## 2014-03-27 VITALS — BP 132/78 | HR 74 | Temp 98.2°F | Ht 69.75 in | Wt 240.8 lb

## 2014-03-27 DIAGNOSIS — Z0000 Encounter for general adult medical examination without abnormal findings: Secondary | ICD-10-CM

## 2014-03-27 DIAGNOSIS — M1612 Unilateral primary osteoarthritis, left hip: Secondary | ICD-10-CM

## 2014-03-27 DIAGNOSIS — Z720 Tobacco use: Secondary | ICD-10-CM

## 2014-03-27 DIAGNOSIS — M169 Osteoarthritis of hip, unspecified: Secondary | ICD-10-CM | POA: Insufficient documentation

## 2014-03-27 DIAGNOSIS — Z1211 Encounter for screening for malignant neoplasm of colon: Secondary | ICD-10-CM

## 2014-03-27 DIAGNOSIS — J309 Allergic rhinitis, unspecified: Secondary | ICD-10-CM

## 2014-03-27 MED ORDER — FLUTICASONE PROPIONATE 50 MCG/ACT NA SUSP: 2.0000 | NASAL | Status: DC | PRN

## 2014-03-27 MED ORDER — FEXOFENADINE HCL 180 MG PO TABS: 180.0000 mg | ORAL_TABLET | Freq: Every day | ORAL | Status: DC

## 2014-03-27 NOTE — Assessment & Plan Note (Signed)
Deteriorated. Given handout for OA supplements (Dr. Estanislado Pandy). Advised seeing Dr. Lorelei Pont as well.

## 2014-03-27 NOTE — Assessment & Plan Note (Signed)
Deteriorated. Discussed tx options- eRx sent for Allegra 180 mg daily and flonase daily. Call or return to clinic prn if these symptoms worsen or fail to improve as anticipated. The patient indicates understanding of these issues and agrees with the plan.

## 2014-03-27 NOTE — Patient Instructions (Signed)
It was nice to see you. Restart flonase and try Allegra (fexofenadine). Let me know how this works.  We will call you about your GI appointment.

## 2014-03-27 NOTE — Assessment & Plan Note (Signed)
Not ready to quit.  

## 2014-03-27 NOTE — Progress Notes (Signed)
Subjective:    Patient ID: David Cowan, male    DOB: 12/27/1960, 53 y.o.   MRN: [id number]  HPI  53 yo male with h/o CAD, tobacco abuse, allergic rhinitis, whom I have not seen in over 2 1/2 years here to discuss:  1.  Allergic rhinitis- has had worsening symptoms this summer and early fall.  Has a lot of nasal drainage and cough, especially in the mornings.  Drainage is clear.  No fevers, chills or tooth pain. He is a smoker- not quite ready to quit. No SOB or CP.  2.  Colonoscopy- I referred him to GI when he was 58 for screening colonoscopy but he had to cancel the appointment.  He is ready to make the appointment now. Denies any changes in his bowel habits or blood in his stool.  3.  Left hip pain- ongoing issues with arthritis.  Tried tylenol.  Afraid to take NSAIDs since he is taking ASA.  Current Outpatient Prescriptions on File Prior to Visit  Medication Sig Dispense Refill  . aspirin 325 MG EC tablet Take 325 mg by mouth daily.        . enalapril (VASOTEC) 10 MG tablet Take one tablet by mouth twice a day.  180 tablet  3  . fenofibrate (TRICOR) 145 MG tablet TAKE 1 TABLET DAILY  90 tablet  3  . folic acid (FOLVITE) 1 MG tablet TAKE 1 TABLET DAILY  90 tablet  3  . metoprolol succinate (TOPROL-XL) 100 MG 24 hr tablet TAKE 1 TABLET AT BEDTIME  90 tablet  3  . nitroGLYCERIN (NITROSTAT) 0.4 MG SL tablet Place 1 tablet (0.4 mg total) under the tongue every 5 (five) minutes as needed for chest pain.  25 tablet  3  . simvastatin (ZOCOR) 40 MG tablet TAKE 1 TABLET AT BEDTIME  90 tablet  3  . triamterene-hydrochlorothiazide (MAXZIDE-25) 37.5-25 MG per tablet TAKE 1 TABLET BY MOUTH DAILY  90 tablet  3   No current facility-administered medications on file prior to visit.    Allergies  Allergen Reactions  . Amoxicillin     REACTION: rash  . Penicillins     Past Medical History  Diagnosis Date  . CAD (coronary artery disease)   . Hypertension   . Hyperlipidemia   . History  of MI (myocardial infarction)   . Bronchitis   . Dyslipidemia   . Right flank pain   . Hand pain     Past Surgical History  Procedure Laterality Date  . Vasectomy      Family History  Problem Relation Age of Onset  . Coronary artery disease       fam hx, early  . Heart attack Father 21  . Heart attack  40    uncle  . Factor V Leiden deficiency Sister     History   Social History  . Marital Status: Married    Spouse Name: N/A    Number of Children: 2  . Years of Education: N/A   Occupational History  . SERVICE MANAGER    Social History Main Topics  . Smoking status: Current Every Day Smoker  . Smokeless tobacco: Not on file  . Alcohol Use: Not on file  . Drug Use: Not on file  . Sexual Activity: Not on file   Other Topics Concern  . Not on file   Social History Narrative   2 daughters   The PMH, PSH, Social History, Family History, Medications, and allergies  have been reviewed in [facility], and have been updated if relevant.     Review of Systems  Constitutional: Negative for fever, chills, appetite change and fatigue.  HENT: Positive for congestion, postnasal drip, rhinorrhea, sneezing and sore throat. Negative for ear discharge, ear pain and sinus pressure.   Respiratory: Positive for cough. Negative for choking, chest tightness, shortness of breath, wheezing and stridor.   Cardiovascular: Negative.   Allergic/Immunologic: Positive for environmental allergies.  All other systems reviewed and are negative.      Objective:   Physical Exam  Nursing note and vitals reviewed. Constitutional: He is oriented to person, place, and time. He appears well-developed and well-nourished. No distress.  HENT:  Head: Normocephalic.  Right Ear: Hearing and tympanic membrane normal.  Left Ear: Hearing and tympanic membrane normal.  Nose: Mucosal edema and rhinorrhea present. No sinus tenderness. Right sinus exhibits no maxillary sinus tenderness and no frontal sinus  tenderness. Left sinus exhibits no maxillary sinus tenderness and no frontal sinus tenderness.  Mouth/Throat: Uvula is midline and mucous membranes are normal. Posterior oropharyngeal erythema present. No oropharyngeal exudate, posterior oropharyngeal edema or tonsillar abscesses.  Neck: Normal range of motion.  Neurological: He is alert and oriented to person, place, and time.  Skin: Skin is warm and dry.   BP 132/78  Pulse 74  Temp(Src) 98.2 F (36.8 C) (Oral)  Ht 5' 9.75" (1.772 m)  Wt 240 lb 12 oz (109.203 kg)  BMI 34.78 kg/m2  SpO2 96%        Assessment & Plan:

## 2014-03-27 NOTE — Progress Notes (Signed)
Pre visit review using our clinic review tool, if applicable. No additional management support is needed unless otherwise documented below in the visit note. 

## 2014-03-27 NOTE — Assessment & Plan Note (Signed)
Referred to GI for screening colonoscopy.  

## 2014-03-28 ENCOUNTER — Telehealth: Payer: Self-pay | Admitting: Family Medicine

## 2014-03-28 NOTE — Telephone Encounter (Signed)
EMMI EMAILED  °

## 2014-05-17 ENCOUNTER — Ambulatory Visit (AMBULATORY_SURGERY_CENTER): Payer: Self-pay | Admitting: *Deleted

## 2014-05-17 VITALS — Ht 70.0 in | Wt 249.8 lb

## 2014-05-17 DIAGNOSIS — Z1211 Encounter for screening for malignant neoplasm of colon: Secondary | ICD-10-CM

## 2014-05-17 MED ORDER — MOVIPREP 100 G PO SOLR: 1.0000 | Freq: Once | ORAL | Status: DC

## 2014-05-17 NOTE — Progress Notes (Signed)
No egg or soy allergy. ewm No diet pills. No blood thinners. ewm No issues with past sedation. ewm No home 02 use. ewm No emmi video per pt. ewm

## 2014-05-31 ENCOUNTER — Ambulatory Visit (AMBULATORY_SURGERY_CENTER): Payer: 59 | Admitting: Gastroenterology

## 2014-05-31 ENCOUNTER — Encounter: Payer: Self-pay | Admitting: Gastroenterology

## 2014-05-31 VITALS — BP 125/72 | HR 75 | Temp 98.1°F | Resp 20 | Ht 70.0 in | Wt 249.0 lb

## 2014-05-31 DIAGNOSIS — Z1211 Encounter for screening for malignant neoplasm of colon: Secondary | ICD-10-CM

## 2014-05-31 DIAGNOSIS — D125 Benign neoplasm of sigmoid colon: Secondary | ICD-10-CM

## 2014-05-31 MED ORDER — SODIUM CHLORIDE 0.9 % IV SOLN: 500.0000 mL | INTRAVENOUS | Status: DC

## 2014-05-31 NOTE — Progress Notes (Signed)
Called to room to assist during endoscopic procedure.  Patient ID and intended procedure confirmed with present staff. Received instructions for my participation in the procedure from the performing physician.  

## 2014-05-31 NOTE — Op Note (Signed)
[facility]  [address] [place], [id number]   COLONOSCOPY PROCEDURE REPORT  PATIENT: David Cowan, David Cowan  MR#: [id number] BIRTHDATE: [birth year] , 53  yrs. old GENDER: male ENDOSCOPIST: Ladene Artist, MD, [facility] REFERRED VQ:[id number] Aron, M.D. PROCEDURE DATE:  05/31/2014 PROCEDURE:   Colonoscopy with biopsy and Colonoscopy with snare polypectomy First Screening Colonoscopy - Avg.  risk and is 50 yrs.  old or older Yes.  Prior Negative Screening - Now for repeat screening. N/A  History of Adenoma - Now for follow-up colonoscopy & has been > or = to 3 yrs.  N/A  Polyps Removed Today? Yes. ASA CLASS:   Class III INDICATIONS:average risk for colorectal cancer. MEDICATIONS: Monitored anesthesia care and Propofol 360 mg IV DESCRIPTION OF PROCEDURE:   After the risks benefits and alternatives of the procedure were thoroughly explained, informed consent was obtained.  The digital rectal exam revealed no abnormalities of the rectum.   The LB [id number]  endoscope was introduced through the anus and advanced to the cecum, which was identified by both the appendix and ileocecal valve. No adverse events experienced.   The quality of the prep was excellent, using MoviPrep  The instrument was then slowly withdrawn as the colon was fully examined.  COLON FINDINGS: A sessile polyp measuring 6 mm in size was found in the sigmoid colon.  A polypectomy was performed with a cold snare. The resection was complete, the polyp tissue was completely retrieved and sent to histology.   A sessile polyp measuring 5 mm in size was found in the sigmoid colon.  A polypectomy was performed with cold forceps.  The resection was complete, the polyp tissue was completely retrieved and sent to histology.   The examination was otherwise normal.  Retroflexed views revealed internal Grade I hemorrhoids. The time to cecum=1 minutes 34 seconds.  Withdrawal time=11 minutes 54 seconds.  The scope  was withdrawn and the procedure completed. COMPLICATIONS: There were no immediate complications.  ENDOSCOPIC IMPRESSION: 1.   Sessile polyp in the sigmoid colon; polypectomy performed with a cold snare 2.   Sessile polyp in the sigmoid colon; polypectomy performed with cold forceps 3.   Grade l internal hemorrhoids  RECOMMENDATIONS: 1.  Await pathology results 2.  Repeat colonoscopy in 5 years if polyp(s) adenomatous; otherwise 10 years  eSigned:  Ladene Artist, MD, [facility] 05/31/2014 10:59 AM

## 2014-05-31 NOTE — Patient Instructions (Signed)
YOU HAD AN ENDOSCOPIC PROCEDURE TODAY AT [facility]: Refer to the procedure report that was given to you for any specific questions about what was found during the examination.  If the procedure report does not answer your questions, please call your gastroenterologist to clarify.  If you requested that your care partner not be given the details of your procedure findings, then the procedure report has been included in a sealed envelope for you to review at your convenience later.  YOU SHOULD EXPECT: Some feelings of bloating in the abdomen. Passage of more gas than usual.  Walking can help get rid of the air that was put into your GI tract during the procedure and reduce the bloating. If you had a lower endoscopy (such as a colonoscopy or flexible sigmoidoscopy) you may notice spotting of blood in your stool or on the toilet paper. If you underwent a bowel prep for your procedure, then you may not have a normal bowel movement for a few days.  DIET: Your first meal following the procedure should be a light meal and then it is ok to progress to your normal diet.  A half-sandwich or bowl of soup is an example of a good first meal.  Heavy or fried foods are harder to digest and may make you feel nauseous or bloated.  Likewise meals heavy in dairy and vegetables can cause extra gas to form and this can also increase the bloating.  Drink plenty of fluids but you should avoid alcoholic beverages for 24 hours.Try to increase the fiber in your diet.  ACTIVITY: Your care partner should take you home directly after the procedure.  You should plan to take it easy, moving slowly for the rest of the day.  You can resume normal activity the day after the procedure however you should NOT DRIVE or use heavy machinery for 24 hours (because of the sedation medicines used during the test).    SYMPTOMS TO REPORT IMMEDIATELY: A gastroenterologist can be reached at any hour.  During normal business hours, 8:30  AM to 5:00 PM Monday through Friday, call [phone].  After hours and on weekends, please call the GI answering service at [phone] who will take a message and have the physician on call contact you.   Following lower endoscopy (colonoscopy or flexible sigmoidoscopy):  Excessive amounts of blood in the stool  Significant tenderness or worsening of abdominal pains  Swelling of the abdomen that is new, acute  Fever of 100F or higher  FOLLOW UP: If any biopsies were taken you will be contacted by phone or by letter within the next 1-3 weeks.  Call your gastroenterologist if you have not heard about the biopsies in 3 weeks.  Our staff will call the home number listed on your records the next business day following your procedure to check on you and address any questions or concerns that you may have at that time regarding the information given to you following your procedure. This is a courtesy call and so if there is no answer at the home number and we have not heard from you through the emergency physician on call, we will assume that you have returned to your regular daily activities without incident.  SIGNATURES/CONFIDENTIALITY: You and/or your care partner have signed paperwork which will be entered into your electronic medical record.  These signatures attest to the fact that that the information above on your After Visit Summary has been reviewed and is understood.  Full responsibility of the confidentiality of this discharge information lies with you and/or your care-partner.  Read all of the handouts given to you by your recovery room nurse.

## 2014-05-31 NOTE — Progress Notes (Signed)
Report to PACU, RN, vss, BBS= Clear.  

## 2014-06-03 ENCOUNTER — Telehealth: Payer: Self-pay

## 2014-06-03 NOTE — Telephone Encounter (Signed)
Left message on answering machine. 

## 2014-06-10 ENCOUNTER — Encounter: Payer: Self-pay | Admitting: Gastroenterology

## 2014-07-12 ENCOUNTER — Other Ambulatory Visit: Payer: Self-pay

## 2014-07-12 MED ORDER — TRIAMTERENE-HCTZ 37.5-25 MG PO TABS: ORAL_TABLET | ORAL | Status: DC

## 2014-08-27 ENCOUNTER — Ambulatory Visit (INDEPENDENT_AMBULATORY_CARE_PROVIDER_SITE_OTHER)
Admission: RE | Admit: 2014-08-27 | Discharge: 2014-08-27 | Disposition: A | Discharge status: Home / Self Care | Payer: 59 | Source: Ambulatory Visit | Attending: Family Medicine | Admitting: Family Medicine

## 2014-08-27 ENCOUNTER — Encounter: Payer: Self-pay | Admitting: Family Medicine

## 2014-08-27 ENCOUNTER — Ambulatory Visit (INDEPENDENT_AMBULATORY_CARE_PROVIDER_SITE_OTHER): Payer: 59 | Admitting: Family Medicine

## 2014-08-27 VITALS — BP 110/70 | HR 76 | Temp 97.7°F | Wt 247.5 lb

## 2014-08-27 DIAGNOSIS — M25552 Pain in left hip: Secondary | ICD-10-CM

## 2014-08-27 NOTE — Assessment & Plan Note (Signed)
New- progressive. Consistent with DJD/trochanteric bursitis. Xray today for further evaluation, make appt with Dr. Lorelei Pont- Re:? Cortisone injections. Continue alleve sparingly as anti inflammatory, add tylenol. The patient indicates understanding of these issues and agrees with the plan.

## 2014-08-27 NOTE — Patient Instructions (Signed)
Good to see you. Try adding Tylenol to your as needed Alleve. Make an appointment with Dr. Lorelei Pont on your way out. We will call you with your hip xray results.

## 2014-08-27 NOTE — Progress Notes (Signed)
Subjective:   Patient ID: David Cowan, male    DOB: 06/21/1960, 54 y.o.   MRN: [id number]  David Cowan is a pleasant 54 y.o. year old male who presents to clinic today with Hip Pain and Knee Pain  on 08/27/2014  HPI: Left hip pain- progressing for past few months. Known h/o OA- see CT abdomen and pelvis from 12/23/10.  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  Lately, left hip pain is getting more severe- feels at times that his hip may give out when he is on his knees.  Does not radiate to his grown, lower leg or thigh.  Alleve prn does help but uses this sparingly since he takes ASA daily.  Does often take Alleve pm because the pain can keep him up at night.  Current Outpatient Prescriptions on File Prior to Visit  Medication Sig Dispense Refill  . aspirin 325 MG EC tablet Take 325 mg by mouth daily.      . enalapril (VASOTEC) 10 MG tablet Take one tablet by mouth twice a day. 180 tablet 3  . fenofibrate (TRICOR) 145 MG tablet TAKE 1 TABLET DAILY 90 tablet 3  . fluticasone (FLONASE) 50 MCG/ACT nasal spray Place 2 sprays into both nostrils as needed for allergies or rhinitis. 16 g 3  . folic acid (FOLVITE) 1 MG tablet TAKE 1 TABLET DAILY 90 tablet 3  . metoprolol succinate (TOPROL-XL) 100 MG 24 hr tablet TAKE 1 TABLET AT BEDTIME 90 tablet 3  . Naproxen Sodium (ALEVE) 220 MG CAPS Take 220 mg by mouth as needed.    . nitroGLYCERIN (NITROSTAT) 0.4 MG SL tablet Place 1 tablet (0.4 mg total) under the tongue every 5 (five) minutes as needed for chest pain. 25 tablet 3  . simvastatin (ZOCOR) 40 MG tablet TAKE 1 TABLET AT BEDTIME 90 tablet 3  . triamterene-hydrochlorothiazide (MAXZIDE-25) 37.5-25 MG per tablet TAKE 1 TABLET BY MOUTH DAILY 90 tablet 1   No current facility-administered medications on file prior to visit.    Allergies  Allergen Reactions  . Amoxicillin     REACTION: rash  . Penicillins     Past Medical History  Diagnosis Date  . CAD (coronary  artery disease)   . Hypertension   . Hyperlipidemia   . History of MI (myocardial infarction)   . Bronchitis   . Dyslipidemia   . Right flank pain   . Hand pain   . Myocardial infarction 2000    Past Surgical History  Procedure Laterality Date  . Vasectomy    . Cardiac catheterization      Family History  Problem Relation Age of Onset  . Coronary artery disease       fam hx, early  . Heart attack Father 72  . Heart attack  40    uncle  . Factor V Leiden deficiency Sister   . Colon cancer Neg Hx   . Congestive Heart Failure Mother 58  . Lung cancer Mother     History   Social History  . Marital Status: Married    Spouse Name: N/A  . Number of Children: 2  . Years of Education: N/A   Occupational History  . SERVICE MANAGER    Social History Main Topics  . Smoking status: Current Every Day Smoker -- 1.00 packs/day    Types: Cigarettes  . Smokeless tobacco: Never Used  . Alcohol Use: 0.0 oz/week    0 Standard drinks or equivalent per  week     Comment: socailly  . Drug Use: Yes    Special: Marijuana     Comment: occ use  . Sexual Activity: Not on file   Other Topics Concern  . Not on file   Social History Narrative   2 daughters   The PMH, PSH, Social History, Family History, Medications, and allergies have been reviewed in [facility], and have been updated if relevant.    Review of Systems  Constitutional: Negative.   Gastrointestinal: Negative.   Genitourinary: Negative.   Musculoskeletal: Positive for joint swelling and arthralgias.  Psychiatric/Behavioral: Negative.   All other systems reviewed and are negative.      Objective:    BP 110/70 mmHg  Pulse 76  Temp(Src) 97.7 F (36.5 C) (Oral)  Wt 247 lb 8 oz (112.265 kg)  SpO2 97%   Physical Exam  Constitutional: He appears well-developed and well-nourished. No distress.  HENT:  Head: Normocephalic.  Eyes: Conjunctivae are normal.  Neck: Normal range of motion.  Cardiovascular: Normal  rate.   Pulmonary/Chest: Effort normal.  Musculoskeletal:       Left hip: He exhibits tenderness. He exhibits no swelling, no crepitus, no deformity and no laceration.  Left hip TTP over left trochanteric bursa, also pain elicited with external rotation  Neurological: No cranial nerve deficit.  Skin: Skin is warm and dry.  Psychiatric: He has a normal mood and affect. His behavior is normal. Judgment and thought content normal.  Nursing note and vitals reviewed.         Assessment & Plan:   Left hip pain No Follow-up on file.

## 2014-08-27 NOTE — Progress Notes (Signed)
Pre visit review using our clinic review tool, if applicable. No additional management support is needed unless otherwise documented below in the visit note. 

## 2014-09-04 ENCOUNTER — Encounter: Payer: Self-pay | Admitting: Family Medicine

## 2014-09-04 ENCOUNTER — Ambulatory Visit (INDEPENDENT_AMBULATORY_CARE_PROVIDER_SITE_OTHER): Payer: 59 | Admitting: Family Medicine

## 2014-09-04 VITALS — BP 108/60 | HR 82 | Temp 97.6°F | Ht 69.75 in | Wt 247.2 lb

## 2014-09-04 DIAGNOSIS — M1612 Unilateral primary osteoarthritis, left hip: Secondary | ICD-10-CM | POA: Diagnosis not present

## 2014-09-04 NOTE — Patient Instructions (Signed)
Dr. Adriana Mccallum at [facility]

## 2014-09-04 NOTE — Progress Notes (Signed)
Pre visit review using our clinic review tool, if applicable. No additional management support is needed unless otherwise documented below in the visit note. 

## 2014-09-05 ENCOUNTER — Telehealth: Payer: Self-pay | Admitting: Family Medicine

## 2014-09-05 NOTE — Telephone Encounter (Signed)
Our conversation ended that he was going to think about things and talk it over with his wife.   I am happy to make a consult to Dr. Alvan Dame for him.

## 2014-09-05 NOTE — Telephone Encounter (Signed)
Pt states you were going to have an apptmt made for him w/Dr. Alvan Dame at [facility].  There's not a referral in the system so is he supposed to make the apptmt himself or does there need to be a referral placed.  Pt requests c/b. Thank you.

## 2014-09-05 NOTE — Telephone Encounter (Signed)
David Cowan notified as instructed by telephone. David Cowan states he was just calling back to let Dr. Lorelei Pont know he did want to move forward with the referral to Dr. Alvan Dame.  I advised that the referral has been ordered and  Rosaria Ferries or Ebony Hail would be calling him with that appointment once they get him scheduled.

## 2014-09-06 NOTE — Progress Notes (Signed)
Dr. Frederico Hamman T. Susana Duell, MD, [facility] [place], [address] Phone: [phone] Fax: [phone]  09/04/2014  Patient: David Cowan, MRN: [id number], DOB: 03-28-1961, 54 y.o.  Primary Physician:  Arnette Norris, MD  Chief Complaint: Hip Pain  Subjective:   Dear Dr. Deborra Medina,  Thank you for having me see David Cowan in consultation today at [facility] for his problem with LEFT hip pain.  As you may recall, he is a 54 y.o. year old male with a history of left > right hip pain ongoing for years.    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  He has been having trouble putting on his socks and shoes for at least the last few years, particularly on the left.  His motion is dramatically limited on the left.  He has pain in the true groin, laterally and posteriorly.  He does not have much significant back pain.  He is not having any radiculopathy or numbness or tingling in the legs.  No prior hip fractures, surgery.  Past Medical History  Diagnosis Date  . CAD (coronary artery disease)   . Hypertension   . Hyperlipidemia   . History of MI (myocardial infarction)   . Bronchitis   . Dyslipidemia   . Right flank pain   . Hand pain   . Myocardial infarction 2000   Past Surgical History  Procedure Laterality Date  . Vasectomy    . Cardiac catheterization     History   Social History  . Marital Status: Married    Spouse Name: N/A  . Number of Children: 2  . Years of Education: N/A   Occupational History  . SERVICE MANAGER    Social History Main Topics  . Smoking status: Current Every Day Smoker -- 1.00 packs/day    Types: Cigarettes  . Smokeless tobacco: Never Used  . Alcohol Use: 0.0 oz/week    0 Standard drinks or equivalent per week     Comment: socailly  . Drug Use: Yes    Special: Marijuana     Comment: occ use  . Sexual Activity: Not on file   Other  Topics Concern  . None   Social History Narrative   2 daughters   Family History  Problem Relation Age of Onset  . Coronary artery disease       fam hx, early  . Heart attack Father 52  . Heart attack  40    uncle  . Factor V Leiden deficiency Sister   . Colon cancer Neg Hx   . Congestive Heart Failure Mother 74  . Lung cancer Mother     Medications and Allergies reviewed   GEN: No fevers, chills. Nontoxic. Primarily MSK c/o today. MSK: Detailed in the HPI GI: tolerating PO intake without difficulty Neuro: No numbness, parasthesias, or tingling associated. Otherwise the pertinent positives of the ROS are noted above.   Objective:   BP 108/60 mmHg  Pulse 82  Temp(Src) 97.6 F (36.4 C) (Oral)  Ht 5' 9.75" (1.772 m)  Wt 247 lb 4 oz (112.152 kg)  BMI 35.72 kg/m2    GEN: WDWN, NAD, Non-toxic, Alert & Oriented x 3 HEENT: Atraumatic, Normocephalic.  Ears and Nose: No external deformity. EXTR: No clubbing/cyanosis/edema NEURO: notably antalgic, trendelenburg gait PSYCH: Normally interactive. Conversant. Not depressed or anxious appearing.  Calm demeanor.   HIP EXAM: SIDE: L  and R ROM: Abduction, Flexion, Internal and External range of motion: effectively minimal to no abduction on the left. NO ROTATIONAL MOVEMENT AT ALL WITH HIP FLEXED TO 90.  On the right, approx 10 deg abd and 10 deg of total rotational movement. Pain with terminal IROM and EROM: YES GTB: NT SLR: NEG Knees: No effusion FABER: unable to complete REVERSE FABER: unable to complete Piriformis: NT at direct palpation Str: flexion: 4+/5 abduction: 4+/5 adduction: 4+/5 Strength testing non-tender     Radiology:  Dg Hip Unilat With Pelvis 2-3 Views Left  08/27/2014   CLINICAL DATA:  Chronic left hip pain for 1 year without known injury. Initial encounter.  EXAM: LEFT HIP (WITH PELVIS) 2-3 VIEWS  COMPARISON:  None.  FINDINGS: No fracture or dislocation is noted. Severe narrowing of the superior  portion of the left hip joint is noted consistent with degenerative joint disease with associated osteophyte formation. There is also noted moderate narrowing of the superior portion of the right hip joint with minimal osteophyte formation suggesting degenerative joint disease on this side.  IMPRESSION: Severe degenerative joint disease is seen involving the left hip, with moderate degenerative joint disease involving the right hip. No fracture or dislocation is noted.   Electronically Signed   By: Marijo Conception, M.D.   On: 08/27/2014 11:54    Assessment and Plan:   Primary osteoarthritis of left hip - Plan: Ambulatory referral to Orthopedic Surgery  >30 minutes spent in face to face time with patient, >50% spent in counselling or coordination of care:  I reviewed the patient's films with him face-to-face.  He has a remarkable loss of motion in both hips, and has functionally no movement in his left hip from a rotational standpoint.  Radiographically and clinically, the patient has end-stage osteoarthritis of the left hip.  He has quite a bit of functional limitation and pain on a daily basis now.  He routinely is doing conservative measures such as Tylenol, Aleve, other anti-inflammatories, pain medication without much relief of symptoms.  The right hip is also clinically moderate to severe osteoarthritis and radiographically more moderate arthritis with some preservation of the joint space.  We discussed options.  I was frank with the patient, and I doubtful that anything short of the total hip arthroplasty on the left will bring him significant, long-lasting relief. I offered to arrange a guided intraarticular injection as a trial, but he wanted to think about it. He later called back, and wanted to speak to a joint surgeon about anterior approach hip replacement, so I have consulted Dr. Alvan Dame.  New Prescriptions   No medications on file   Orders Placed This Encounter  Procedures  .  Ambulatory referral to Orthopedic Surgery    We will see the patient back prn.   Thank you for having [place] see David Cowan in consultation.  Feel free to contact me with any questions.  Signed,  Maud Deed. Mohamedamin Nifong, MD   Patient's Medications  New Prescriptions   No medications on file  Previous Medications   ASPIRIN 325 MG EC TABLET    Take 325 mg by mouth daily.     ENALAPRIL (VASOTEC) 10 MG TABLET    Take one tablet by mouth twice a day.   FENOFIBRATE (TRICOR) 145 MG TABLET    TAKE 1 TABLET DAILY   FLUTICASONE (FLONASE) 50 MCG/ACT NASAL SPRAY    Place 2 sprays into both nostrils as needed for allergies or rhinitis.   FOLIC  ACID (FOLVITE) 1 MG TABLET    TAKE 1 TABLET DAILY   METOPROLOL SUCCINATE (TOPROL-XL) 100 MG 24 HR TABLET    TAKE 1 TABLET AT BEDTIME   NAPROXEN SODIUM (ALEVE) 220 MG CAPS    Take 220 mg by mouth as needed.   NITROGLYCERIN (NITROSTAT) 0.4 MG SL TABLET    Place 1 tablet (0.4 mg total) under the tongue every 5 (five) minutes as needed for chest pain.   SIMVASTATIN (ZOCOR) 40 MG TABLET    TAKE 1 TABLET AT BEDTIME   TRIAMTERENE-HYDROCHLOROTHIAZIDE (MAXZIDE-25) 37.5-25 MG PER TABLET    TAKE 1 TABLET BY MOUTH DAILY  Modified Medications   No medications on file  Discontinued Medications   No medications on file

## 2014-09-10 ENCOUNTER — Encounter: Payer: Self-pay | Admitting: Family Medicine

## 2014-09-11 ENCOUNTER — Encounter: Payer: Self-pay | Admitting: Family Medicine

## 2014-09-11 NOTE — Telephone Encounter (Signed)
Please check on this.

## 2014-09-23 ENCOUNTER — Telehealth: Payer: Self-pay | Admitting: Internal Medicine

## 2014-09-23 NOTE — Telephone Encounter (Signed)
Spoke with wife and let her know okay to take

## 2014-09-23 NOTE — Telephone Encounter (Signed)
New message       Can pt take mobic 15mg  1 tablet daily for arthritis----prescribed by Dr  French Ana?

## 2014-11-21 ENCOUNTER — Telehealth: Payer: Self-pay | Admitting: *Deleted

## 2014-11-21 NOTE — Telephone Encounter (Signed)
Form received from [place] indicating pt needing surgical clearance for L hip surgery. LM on pts vm informing him that he is needing to contact office and schedule OV for EKG in order to receive clearance.

## 2014-11-25 ENCOUNTER — Telehealth: Payer: Self-pay | Admitting: Internal Medicine

## 2014-11-25 NOTE — Telephone Encounter (Signed)
New Message       Pt calling wanting to know if we received surg clearance form from [place] and info that he needs EKG prior to his procedure. Please call back and advise.

## 2014-11-25 NOTE — Telephone Encounter (Signed)
The pt is advised that Dr Lovena Le is not in the office at this time and that I will forward this message to him and his nurse, Claiborne Billings.

## 2014-11-25 NOTE — Telephone Encounter (Signed)
Spoke to pt who states that he has a cardiologist and was advised that he has to be cleared with them as well. Pt is awaiting a response from cardiology before scheduling OV as they may need to do an EKG and he wont have to repeat it. PCP clearance may not be required once cleared by cardiology; depends on performing surgeon.

## 2014-11-27 NOTE — Telephone Encounter (Signed)
Clearance faxed by Maudie Mercury in MR today

## 2014-11-28 ENCOUNTER — Other Ambulatory Visit (HOSPITAL_COMMUNITY): Payer: Self-pay | Admitting: *Deleted

## 2014-11-28 NOTE — Progress Notes (Signed)
ekg 12-18-13 epic lov dr Lovena Le 12-19-13 epic Cardiology clearance note dr Lovena Le on chart for 12-10-2014 surgery

## 2014-11-28 NOTE — Patient Instructions (Addendum)
David Cowan  11/28/2014   Your procedure is scheduled on: Tuesday 12-10-14  Report to [facility]  Entrance go to [facility] elevators take elevator to third  floor  And go to               [place] at 1120 AM.  Call this number if you have problems the morning of surgery [phone]   Remember: ONLY 1 PERSON MAY GO WITH YOU TO SHORT STAY TO GET  [place].   Do not eat food  :After Midnight, may have clear liquids from midnight until 820 am, nothing after 820 am day of surgery.      Take these medicines the morning of surgery with A SIP OF WATER: none                               You may not have any metal on your body including hair pins and              piercings  Do not wear jewelry, make-up, lotions, powders or perfumes, deodorant             Do not wear nail polish.  Do not shave  48 hours prior to surgery.              Men may shave face and neck.   Do not bring valuables to the hospital. [place].  Contacts, dentures or bridgework may not be worn into surgery.  Leave suitcase in the car. After surgery it may be brought to your room.     Patients discharged the day of surgery will not be allowed to drive home.  Name and phone number of your driver:  Special Instructions: N/A              Please read over the following fact sheets you were given: _____________________________________________________________________             [facility] - Preparing for Surgery Before surgery, you can play an important role.  Because skin is not sterile, your skin needs to be as free of germs as possible.  You can reduce the number of germs on your skin by washing with CHG (chlorahexidine gluconate) soap before surgery.  CHG is an antiseptic cleaner which kills germs and bonds with the skin to continue killing germs even after washing. Please DO NOT use if you have an allergy to CHG or  antibacterial soaps.  If your skin becomes reddened/irritated stop using the CHG and inform your nurse when you arrive at Short Stay. Do not shave (including legs and underarms) for at least 48 hours prior to the first CHG shower.  You may shave your face/neck. Please follow these instructions carefully:  1.  Shower with CHG Soap the night before surgery and the  morning of Surgery.  2.  If you choose to wash your hair, wash your hair first as usual with your  normal  shampoo.  3.  After you shampoo, rinse your hair and body thoroughly to remove the  shampoo.                           4.  Use CHG as you would any other liquid soap.  You can apply chg directly  to the skin and wash                       Gently with a scrungie or clean washcloth.  5.  Apply the CHG Soap to your body ONLY FROM THE NECK DOWN.   Do not use on face/ open                           Wound or open sores. Avoid contact with eyes, ears mouth and genitals (private parts).                       Wash face,  Genitals (private parts) with your normal soap.             6.  Wash thoroughly, paying special attention to the area where your surgery  will be performed.  7.  Thoroughly rinse your body with warm water from the neck down.  8.  DO NOT shower/wash with your normal soap after using and rinsing off  the CHG Soap.                9.  Pat yourself dry with a clean towel.            10.  Wear clean pajamas.            11.  Place clean sheets on your bed the night of your first shower and do not  sleep with pets. Day of Surgery : Do not apply any lotions/deodorants the morning of surgery.  Please wear clean clothes to the hospital/surgery center.  FAILURE TO FOLLOW THESE INSTRUCTIONS MAY RESULT IN THE CANCELLATION OF YOUR SURGERY PATIENT SIGNATURE_________________________________  NURSE SIGNATURE__________________________________  ________________________________________________________________________    CLEAR LIQUID  DIET   Foods Allowed                                                                     Foods Excluded  Coffee and tea, regular and decaf                             liquids that you cannot  Plain Jell-O in any flavor                                             see through such as: Fruit ices (not with fruit pulp)                                     milk, soups, orange juice  Iced Popsicles                                    All solid food Carbonated beverages, regular and diet  Cranberry, grape and apple juices Sports drinks like Gatorade Lightly seasoned clear broth or consume(fat free) Sugar, honey syrup  Sample Menu Breakfast                                Lunch                                     Supper Cranberry juice                    Beef broth                            Chicken broth Jell-O                                     Grape juice                           Apple juice Coffee or tea                        Jell-O                                      Popsicle                                                Coffee or tea                        Coffee or tea  _____________________________________________________________________    Incentive Spirometer  An incentive spirometer is a tool that can help keep your lungs clear and active. This tool measures how well you are filling your lungs with each breath. Taking long deep breaths may help reverse or decrease the chance of developing breathing (pulmonary) problems (especially infection) following:  A long period of time when you are unable to move or be active. BEFORE THE PROCEDURE   If the spirometer includes an indicator to show your best effort, your nurse or respiratory therapist will set it to a desired goal.  If possible, sit up straight or lean slightly forward. Try not to slouch.  Hold the incentive spirometer in an upright position. INSTRUCTIONS FOR USE   Sit on the edge of  your bed if possible, or sit up as far as you can in bed or on a chair.  Hold the incentive spirometer in an upright position.  Breathe out normally.  Place the mouthpiece in your mouth and seal your lips tightly around it.  Breathe in slowly and as deeply as possible, raising the piston or the ball toward the top of the column.  Hold your breath for 3-5 seconds or for as long as possible. Allow the piston or ball to fall to the bottom of the column.  Remove the mouthpiece from your mouth and breathe out normally.  Rest for a few seconds and repeat Steps 1 through 7 at  least 10 times every 1-2 hours when you are awake. Take your time and take a few normal breaths between deep breaths.  The spirometer may include an indicator to show your best effort. Use the indicator as a goal to work toward during each repetition.  After each set of 10 deep breaths, practice coughing to be sure your lungs are clear. If you have an incision (the cut made at the time of surgery), support your incision when coughing by placing a pillow or rolled up towels firmly against it. Once you are able to get out of bed, walk around indoors and cough well. You may stop using the incentive spirometer when instructed by your caregiver.  RISKS AND COMPLICATIONS  Take your time so you do not get dizzy or light-headed.  If you are in pain, you may need to take or ask for pain medication before doing incentive spirometry. It is harder to take a deep breath if you are having pain. AFTER USE  Rest and breathe slowly and easily.  It can be helpful to keep track of a log of your progress. Your caregiver can provide you with a simple table to help with this. If you are using the spirometer at home, follow these instructions: [place] IF:   You are having difficultly using the spirometer.  You have trouble using the spirometer as often as instructed.  Your pain medication is not giving enough relief while using  the spirometer.  You develop fever of 100.5 F (38.1 C) or higher. SEEK IMMEDIATE MEDICAL CARE IF:   You cough up bloody sputum that had not been present before.  You develop fever of 102 F (38.9 C) or greater.  You develop worsening pain at or near the incision site. MAKE SURE YOU:   Understand these instructions.  Will watch your condition.  Will get help right away if you are not doing well or get worse. Document Released: 10/11/2006 Document Revised: 08/23/2011 Document Reviewed: 12/12/2006 ExitCare Patient Information 2014 Exit[employer], [place].   ________________________________________________________________________  WHAT IS A BLOOD TRANSFUSION? Blood Transfusion Information  A transfusion is the replacement of blood or some of its parts. Blood is made up of multiple cells which provide different functions.  Red blood cells carry oxygen and are used for blood loss replacement.  White blood cells fight against infection.  Platelets control bleeding.  Plasma helps clot blood.  Other blood products are available for specialized needs, such as hemophilia or other clotting disorders. BEFORE THE TRANSFUSION  Who gives blood for transfusions?   Healthy volunteers who are fully evaluated to make sure their blood is safe. This is blood bank blood. Transfusion therapy is the safest it has ever been in the practice of medicine. Before blood is taken from a donor, a complete history is taken to make sure that person has no history of diseases nor engages in risky social behavior (examples are intravenous drug use or sexual activity with multiple partners). The donor's travel history is screened to minimize risk of transmitting infections, such as malaria. The donated blood is tested for signs of infectious diseases, such as HIV and hepatitis. The blood is then tested to be sure it is compatible with you in order to minimize the chance of a transfusion reaction. If you or a relative  donates blood, this is often done in anticipation of surgery and is not appropriate for emergency situations. It takes many days to process the donated blood. RISKS AND COMPLICATIONS Although transfusion  therapy is very safe and saves many lives, the main dangers of transfusion include:   Getting an infectious disease.  Developing a transfusion reaction. This is an allergic reaction to something in the blood you were given. Every precaution is taken to prevent this. The decision to have a blood transfusion has been considered carefully by your caregiver before blood is given. Blood is not given unless the benefits outweigh the risks. AFTER THE TRANSFUSION  Right after receiving a blood transfusion, you will usually feel much better and more energetic. This is especially true if your red blood cells have gotten low (anemic). The transfusion raises the level of the red blood cells which carry oxygen, and this usually causes an energy increase.  The nurse administering the transfusion will monitor you carefully for complications. HOME CARE INSTRUCTIONS  No special instructions are needed after a transfusion. You may find your energy is better. Speak with your caregiver about any limitations on activity for underlying diseases you may have. SEEK MEDICAL CARE IF:   Your condition is not improving after your transfusion.  You develop redness or irritation at the intravenous (IV) site. SEEK IMMEDIATE MEDICAL CARE IF:  Any of the following symptoms occur over the next 12 hours:  Shaking chills.  You have a temperature by mouth above 102 F (38.9 C), not controlled by medicine.  Chest, back, or muscle pain.  People around you feel you are not acting correctly or are confused.  Shortness of breath or difficulty breathing.  Dizziness and fainting.  You get a rash or develop hives.  You have a decrease in urine output.  Your urine turns a dark color or changes to pink, red, or brown. Any  of the following symptoms occur over the next 10 days:  You have a temperature by mouth above 102 F (38.9 C), not controlled by medicine.  Shortness of breath.  Weakness after normal activity.  The white part of the eye turns yellow (jaundice).  You have a decrease in the amount of urine or are urinating less often.  Your urine turns a dark color or changes to pink, red, or brown. Document Released: 05/28/2000 Document Revised: 08/23/2011 Document Reviewed: 01/15/2008 [facility] Patient Information 2014 [place], [place].  _______________________________________________________________________

## 2014-11-29 ENCOUNTER — Encounter (HOSPITAL_COMMUNITY): Payer: Self-pay

## 2014-11-29 ENCOUNTER — Encounter (HOSPITAL_COMMUNITY)
Admission: RE | Admit: 2014-11-29 | Discharge: 2014-11-29 | Disposition: A | Discharge status: Home / Self Care | Payer: 59 | Source: Ambulatory Visit | Attending: Orthopedic Surgery | Admitting: Orthopedic Surgery

## 2014-11-29 ENCOUNTER — Telehealth: Payer: Self-pay | Admitting: Family Medicine

## 2014-11-29 DIAGNOSIS — Z01818 Encounter for other preprocedural examination: Secondary | ICD-10-CM | POA: Insufficient documentation

## 2014-11-29 HISTORY — DX: Unspecified hearing loss, unspecified ear: H91.90

## 2014-11-29 HISTORY — DX: Unspecified osteoarthritis, unspecified site: M19.90

## 2014-11-29 LAB — URINALYSIS, ROUTINE W REFLEX MICROSCOPIC
BILIRUBIN URINE: NEGATIVE
Glucose, UA: NEGATIVE mg/dL
KETONES UR: NEGATIVE mg/dL
LEUKOCYTES UA: NEGATIVE
Nitrite: NEGATIVE
Protein, ur: 30 mg/dL — AB
Specific Gravity, Urine: 1.013 (ref 1.005–1.030)
UROBILINOGEN UA: 0.2 mg/dL (ref 0.0–1.0)
pH: 6.5 (ref 5.0–8.0)

## 2014-11-29 LAB — URINE MICROSCOPIC-ADD ON

## 2014-11-29 LAB — CBC
HEMATOCRIT: 43 % (ref 39.0–52.0)
HEMOGLOBIN: 13.7 g/dL (ref 13.0–17.0)
MCH: 29.5 pg (ref 26.0–34.0)
MCHC: 31.9 g/dL (ref 30.0–36.0)
MCV: 92.5 fL (ref 78.0–100.0)
Platelets: 258 10*3/uL (ref 150–400)
RBC: 4.65 MIL/uL (ref 4.22–5.81)
RDW: 14.3 % (ref 11.5–15.5)
WBC: 12.3 10*3/uL — ABNORMAL HIGH (ref 4.0–10.5)

## 2014-11-29 LAB — BASIC METABOLIC PANEL
Anion gap: 7 (ref 5–15)
BUN: 30 mg/dL — AB (ref 6–20)
CO2: 29 mmol/L (ref 22–32)
Calcium: 9.4 mg/dL (ref 8.9–10.3)
Chloride: 105 mmol/L (ref 101–111)
Creatinine, Ser: 1.32 mg/dL — ABNORMAL HIGH (ref 0.61–1.24)
GFR calc Af Amer: >60 mL/min (ref >60)
GFR, EST NON AFRICAN AMERICAN: 60 mL/min — AB (ref >60)
GLUCOSE: 101 mg/dL — AB (ref 65–99)
POTASSIUM: 4.1 mmol/L (ref 3.5–5.1)
Sodium: 141 mmol/L (ref 135–145)

## 2014-11-29 LAB — SURGICAL PCR SCREEN
MRSA, PCR: NEGATIVE
Staphylococcus aureus: NEGATIVE

## 2014-11-29 LAB — PROTIME-INR
INR: 1 (ref 0.00–1.49)
Prothrombin Time: 13.4 seconds (ref 11.6–15.2)

## 2014-11-29 LAB — APTT: aPTT: 27 seconds (ref 24–37)

## 2014-11-29 NOTE — Telephone Encounter (Signed)
Patient called about surgical clearance.  Patient said he had a pre-op appointment with [facility]le and they did an EKG.  He has clearance for surgery by Dr.Taylor.  Patient was told he needed an EKG done at our office and he wanted to let Dr.Aron know she can see the EKG in Epic.

## 2014-11-30 NOTE — Telephone Encounter (Signed)
They need [place] to repeat an EKG after he had one done with [name]?  Ok to schedule a surgical clearance appointment.

## 2014-12-03 NOTE — H&P (Signed)
TOTAL HIP ADMISSION H&P  Patient is admitted for left total hip arthroplasty, anterior approach.  Subjective:  Chief Complaint: Left hip primary OA / pain  HPI: David Cowan, 54 y.o. male, has a history of pain and functional disability in the left hip(s) due to arthritis and patient has failed non-surgical conservative treatments for greater than 12 weeks to include NSAID's and/or analgesics, corticosteriod injections and activity modification.  Onset of symptoms was gradual starting 1+ years ago with gradually worsening course since that time.The patient noted no past surgery on the left hip(s).  Patient currently rates pain in the left hip at 9 out of 10 with activity. Patient has night pain, worsening of pain with activity and weight bearing, trendelenberg gait, pain that interfers with activities of daily living and pain with passive range of motion. Patient has evidence of periarticular osteophytes and joint space narrowing by imaging studies. This condition presents safety issues increasing the risk of falls.  There is no current active infection.  Risks, benefits and expectations were discussed with the patient.  Risks including but not limited to the risk of anesthesia, blood clots, nerve damage, blood vessel damage, failure of the prosthesis, infection and up to and including death.  Patient understand the risks, benefits and expectations and wishes to proceed with surgery.   PCP: Arnette Norris, MD  D/C Plans:      Home with HHPT  Post-op Meds:       No Rx given  Tranexamic Acid:      To be given - topical (MI in 2002)  Decadron:      Is to be given  FYI:     ASA post-op  Norco post-op    Patient Active Problem List   Diagnosis Date Noted  . Left hip pain 08/27/2014  . Allergic rhinitis 03/27/2014  . Health care maintenance 03/27/2014  . OA (osteoarthritis) of hip 03/27/2014  . Tobacco abuse 12/19/2013  . Bronchitis 12/01/2010  . MYOCARDIAL INFARCTION, HX OF 07/10/2010  .  HYPERGLYCEMIA 07/10/2010  . DYSLIPIDEMIA 03/13/2009  . HYPERTENSION 03/13/2009  . CAD 03/13/2009   Past Medical History  Diagnosis Date  . CAD (coronary artery disease)   . Hypertension   . Hyperlipidemia   . History of MI (myocardial infarction)   . Bronchitis   . Dyslipidemia   . Right flank pain   . Hand pain   . Myocardial infarction 2000  . Arthritis   . Hard of hearing     left ear    Past Surgical History  Procedure Laterality Date  . Vasectomy    . Cardiac catheterization      No prescriptions prior to admission   Allergies  Allergen Reactions  . Amoxicillin     REACTION: rash  . Penicillins     History  Substance Use Topics  . Smoking status: Current Every Day Smoker -- 1.00 packs/day    Types: Cigarettes  . Smokeless tobacco: Never Used  . Alcohol Use: 0.0 oz/week    0 Standard drinks or equivalent per week     Comment: socailly    Family History  Problem Relation Age of Onset  . Coronary artery disease       fam hx, early  . Heart attack Father 28  . Heart attack  40    uncle  . Factor V Leiden deficiency Sister   . Colon cancer Neg Hx   . Congestive Heart Failure Mother 42  . Lung cancer Mother  Review of Systems  Constitutional: Negative.   HENT: Positive for hearing loss (left ear).   Eyes: Negative.   Respiratory: Negative.   Cardiovascular: Negative.   Gastrointestinal: Negative.   Genitourinary: Negative.   Musculoskeletal: Positive for joint pain.  Skin: Negative.   Neurological: Negative.   Endo/Heme/Allergies: Positive for environmental allergies.  Psychiatric/Behavioral: Negative.     Objective:  Physical Exam  Constitutional: He is oriented to person, place, and time. He appears well-developed and well-nourished.  HENT:  Head: Normocephalic and atraumatic.  Eyes: Pupils are equal, round, and reactive to light.  Neck: Neck supple. No JVD present. No tracheal deviation present. No thyromegaly present.   Cardiovascular: Normal rate, regular rhythm, normal heart sounds and intact distal pulses.   Respiratory: Effort normal and breath sounds normal. No stridor. No respiratory distress. He has no wheezes.  GI: Soft. There is no tenderness. There is no guarding.  Musculoskeletal:       Left hip: He exhibits decreased range of motion, decreased strength, tenderness and bony tenderness. He exhibits no swelling, no deformity and no laceration.  Lymphadenopathy:    He has no cervical adenopathy.  Neurological: He is alert and oriented to person, place, and time.  Skin: Skin is warm.  Psychiatric: He has a normal mood and affect.      Labs:  Estimated body mass index is 35.72 kg/(m^2) as calculated from the following:   Height as of 09/04/14: 5' 9.75" (1.772 m).   Weight as of 09/04/14: 112.152 kg (247 lb 4 oz).   Imaging Review Plain radiographs demonstrate severe degenerative joint disease of the left hip(s). The bone quality appears to be good for age and reported activity level.  Assessment/Plan:  End stage arthritis, left hip(s)  The patient history, physical examination, clinical judgement of the provider and imaging studies are consistent with end stage degenerative joint disease of the left hip(s) and total hip arthroplasty is deemed medically necessary. The treatment options including medical management, injection therapy, arthroscopy and arthroplasty were discussed at length. The risks and benefits of total hip arthroplasty were presented and reviewed. The risks due to aseptic loosening, infection, stiffness, dislocation/subluxation,  thromboembolic complications and other imponderables were discussed.  The patient acknowledged the explanation, agreed to proceed with the plan and consent was signed. Patient is being admitted for inpatient treatment for surgery, pain control, PT, OT, prophylactic antibiotics, VTE prophylaxis, progressive ambulation and ADL's and discharge planning.The  patient is planning to be discharged home with home health services.    [place] Pugh Ansh Fauble   PA-C  12/03/2014, 2:52 PM

## 2014-12-03 NOTE — Progress Notes (Signed)
Patient called and notified of surgery time change. Patient instructed to arrive at Short Stay on Tuesday 12/10/2014 at 0820 am and nothing to eat or drink after midnight. Patient verbalized understanding.

## 2014-12-04 ENCOUNTER — Encounter: Payer: 59 | Admitting: Family Medicine

## 2014-12-04 NOTE — Progress Notes (Signed)
Pre visit review using our clinic review tool, if applicable. No additional management support is needed unless otherwise documented below in the visit note. 

## 2014-12-10 ENCOUNTER — Encounter (HOSPITAL_COMMUNITY): Payer: Self-pay | Admitting: *Deleted

## 2014-12-10 ENCOUNTER — Encounter (HOSPITAL_COMMUNITY)
Admission: RE | Disposition: A | Discharge status: Home Health | Payer: Self-pay | Source: Ambulatory Visit | Attending: Orthopedic Surgery

## 2014-12-10 ENCOUNTER — Inpatient Hospital Stay (HOSPITAL_COMMUNITY): Payer: 59 | Admitting: Anesthesiology

## 2014-12-10 ENCOUNTER — Inpatient Hospital Stay (HOSPITAL_COMMUNITY)
Admission: RE | Admit: 2014-12-10 | Discharge: 2014-12-11 | DRG: 470 | Disposition: A | Discharge status: Home Health | Payer: 59 | Source: Ambulatory Visit | Attending: Orthopedic Surgery | Admitting: Orthopedic Surgery

## 2014-12-10 ENCOUNTER — Inpatient Hospital Stay (HOSPITAL_COMMUNITY): Payer: 59

## 2014-12-10 DIAGNOSIS — I10 Essential (primary) hypertension: Secondary | ICD-10-CM | POA: Diagnosis present

## 2014-12-10 DIAGNOSIS — I2510 Atherosclerotic heart disease of native coronary artery without angina pectoris: Secondary | ICD-10-CM | POA: Diagnosis present

## 2014-12-10 DIAGNOSIS — I252 Old myocardial infarction: Secondary | ICD-10-CM | POA: Diagnosis not present

## 2014-12-10 DIAGNOSIS — Z6836 Body mass index (BMI) 36.0-36.9, adult: Secondary | ICD-10-CM

## 2014-12-10 DIAGNOSIS — E669 Obesity, unspecified: Secondary | ICD-10-CM | POA: Diagnosis present

## 2014-12-10 DIAGNOSIS — F17210 Nicotine dependence, cigarettes, uncomplicated: Secondary | ICD-10-CM | POA: Diagnosis present

## 2014-12-10 DIAGNOSIS — E785 Hyperlipidemia, unspecified: Secondary | ICD-10-CM | POA: Diagnosis present

## 2014-12-10 DIAGNOSIS — H918X2 Other specified hearing loss, left ear: Secondary | ICD-10-CM | POA: Diagnosis present

## 2014-12-10 DIAGNOSIS — M25552 Pain in left hip: Secondary | ICD-10-CM | POA: Diagnosis present

## 2014-12-10 DIAGNOSIS — Z8249 Family history of ischemic heart disease and other diseases of the circulatory system: Secondary | ICD-10-CM

## 2014-12-10 DIAGNOSIS — M1612 Unilateral primary osteoarthritis, left hip: Secondary | ICD-10-CM | POA: Diagnosis present

## 2014-12-10 DIAGNOSIS — Z01812 Encounter for preprocedural laboratory examination: Secondary | ICD-10-CM

## 2014-12-10 DIAGNOSIS — Z96649 Presence of unspecified artificial hip joint: Secondary | ICD-10-CM

## 2014-12-10 HISTORY — PX: TOTAL HIP ARTHROPLASTY: SHX124

## 2014-12-10 LAB — ABO/RH: ABO/RH(D): A POS

## 2014-12-10 LAB — TYPE AND SCREEN
ABO/RH(D): A POS
Antibody Screen: NEGATIVE

## 2014-12-10 SURGERY — ARTHROPLASTY, HIP, TOTAL, ANTERIOR APPROACH
Anesthesia: Spinal | Laterality: Left

## 2014-12-10 MED ORDER — TRIAMTERENE-HCTZ 37.5-25 MG PO TABS
1.0000 | ORAL_TABLET | Freq: Every day | ORAL | Status: DC
  Administered 2014-12-11: 1 via ORAL
  Filled 2014-12-10: qty 1

## 2014-12-10 MED ORDER — DOCUSATE SODIUM 100 MG PO CAPS
100.0000 mg | ORAL_CAPSULE | Freq: Two times a day (BID) | ORAL | Status: DC
  Administered 2014-12-10 – 2014-12-11 (×2): 100 mg via ORAL

## 2014-12-10 MED ORDER — PROPOFOL INFUSION 10 MG/ML OPTIME
INTRAVENOUS | Status: DC | PRN
  Administered 2014-12-10: 160 ug/kg/min via INTRAVENOUS

## 2014-12-10 MED ORDER — HYDROMORPHONE HCL 1 MG/ML IJ SOLN
0.5000 mg | INTRAMUSCULAR | Status: DC | PRN
  Administered 2014-12-10 (×2): 1 mg via INTRAVENOUS
  Filled 2014-12-10 (×2): qty 1

## 2014-12-10 MED ORDER — HYDROMORPHONE HCL 1 MG/ML IJ SOLN: 0.2500 mg | INTRAMUSCULAR | Status: DC | PRN

## 2014-12-10 MED ORDER — MIDAZOLAM HCL 2 MG/2ML IJ SOLN
INTRAMUSCULAR | Status: AC
  Filled 2014-12-10: qty 2

## 2014-12-10 MED ORDER — FLUTICASONE PROPIONATE 50 MCG/ACT NA SUSP
2.0000 | NASAL | Status: DC | PRN
  Filled 2014-12-10: qty 16

## 2014-12-10 MED ORDER — DEXAMETHASONE SODIUM PHOSPHATE 10 MG/ML IJ SOLN
10.0000 mg | Freq: Once | INTRAMUSCULAR | Status: AC
  Administered 2014-12-10: 10 mg via INTRAVENOUS

## 2014-12-10 MED ORDER — ONDANSETRON HCL 4 MG/2ML IJ SOLN
INTRAMUSCULAR | Status: DC | PRN
  Administered 2014-12-10: 4 mg via INTRAVENOUS

## 2014-12-10 MED ORDER — CEFAZOLIN SODIUM-DEXTROSE 2-3 GM-% IV SOLR
2.0000 g | INTRAVENOUS | Status: AC
  Administered 2014-12-10: 2 g via INTRAVENOUS

## 2014-12-10 MED ORDER — METOPROLOL SUCCINATE ER 100 MG PO TB24
100.0000 mg | ORAL_TABLET | Freq: Every day | ORAL | Status: DC
  Filled 2014-12-10: qty 1

## 2014-12-10 MED ORDER — DEXAMETHASONE SODIUM PHOSPHATE 10 MG/ML IJ SOLN
10.0000 mg | Freq: Once | INTRAMUSCULAR | Status: AC
  Administered 2014-12-11: 10 mg via INTRAVENOUS
  Filled 2014-12-10: qty 1

## 2014-12-10 MED ORDER — SUCCINYLCHOLINE CHLORIDE 20 MG/ML IJ SOLN
INTRAMUSCULAR | Status: DC | PRN
  Administered 2014-12-10: 100 mg via INTRAVENOUS

## 2014-12-10 MED ORDER — FENOFIBRATE 160 MG PO TABS
160.0000 mg | ORAL_TABLET | Freq: Every day | ORAL | Status: DC
  Administered 2014-12-10 – 2014-12-11 (×2): 160 mg via ORAL
  Filled 2014-12-10 (×2): qty 1

## 2014-12-10 MED ORDER — FERROUS SULFATE 325 (65 FE) MG PO TABS
325.0000 mg | ORAL_TABLET | Freq: Three times a day (TID) | ORAL | Status: DC
  Administered 2014-12-10 – 2014-12-11 (×3): 325 mg via ORAL
  Filled 2014-12-10 (×5): qty 1

## 2014-12-10 MED ORDER — MAGNESIUM CITRATE PO SOLN: 1.0000 | Freq: Once | ORAL | Status: AC | PRN

## 2014-12-10 MED ORDER — PHENYLEPHRINE HCL 10 MG/ML IJ SOLN
INTRAMUSCULAR | Status: DC | PRN
  Administered 2014-12-10 (×4): 80 ug via INTRAVENOUS

## 2014-12-10 MED ORDER — PHENOL 1.4 % MT LIQD: 1.0000 | OROMUCOSAL | Status: DC | PRN

## 2014-12-10 MED ORDER — DIPHENHYDRAMINE HCL 25 MG PO CAPS: 25.0000 mg | ORAL_CAPSULE | Freq: Four times a day (QID) | ORAL | Status: DC | PRN

## 2014-12-10 MED ORDER — ONDANSETRON HCL 4 MG/2ML IJ SOLN
INTRAMUSCULAR | Status: AC
  Filled 2014-12-10: qty 2

## 2014-12-10 MED ORDER — NITROGLYCERIN 0.4 MG SL SUBL: 0.4000 mg | SUBLINGUAL_TABLET | SUBLINGUAL | Status: DC | PRN

## 2014-12-10 MED ORDER — SODIUM CHLORIDE 0.9 % IV SOLN
10.0000 mg | INTRAVENOUS | Status: DC | PRN
  Administered 2014-12-10: 50 ug/min via INTRAVENOUS

## 2014-12-10 MED ORDER — PROPOFOL 10 MG/ML IV BOLUS
INTRAVENOUS | Status: AC
  Filled 2014-12-10: qty 20

## 2014-12-10 MED ORDER — CEFAZOLIN SODIUM-DEXTROSE 2-3 GM-% IV SOLR
INTRAVENOUS | Status: AC
  Filled 2014-12-10: qty 50

## 2014-12-10 MED ORDER — CELECOXIB 200 MG PO CAPS
200.0000 mg | ORAL_CAPSULE | Freq: Two times a day (BID) | ORAL | Status: DC
  Administered 2014-12-10 – 2014-12-11 (×2): 200 mg via ORAL
  Filled 2014-12-10 (×3): qty 1

## 2014-12-10 MED ORDER — GLYCOPYRROLATE 0.2 MG/ML IJ SOLN
INTRAMUSCULAR | Status: AC
  Filled 2014-12-10: qty 2

## 2014-12-10 MED ORDER — GLYCOPYRROLATE 0.2 MG/ML IJ SOLN
INTRAMUSCULAR | Status: AC
  Filled 2014-12-10: qty 1

## 2014-12-10 MED ORDER — METHOCARBAMOL 500 MG PO TABS
500.0000 mg | ORAL_TABLET | Freq: Four times a day (QID) | ORAL | Status: DC | PRN
  Administered 2014-12-10 – 2014-12-11 (×4): 500 mg via ORAL
  Filled 2014-12-10 (×4): qty 1

## 2014-12-10 MED ORDER — PROPOFOL 10 MG/ML IV BOLUS
INTRAVENOUS | Status: DC | PRN
  Administered 2014-12-10 (×2): 50 mg via INTRAVENOUS

## 2014-12-10 MED ORDER — ALUM & MAG HYDROXIDE-SIMETH 200-200-20 MG/5ML PO SUSP: 30.0000 mL | ORAL | Status: DC | PRN

## 2014-12-10 MED ORDER — MIDAZOLAM HCL 5 MG/5ML IJ SOLN
INTRAMUSCULAR | Status: DC | PRN
  Administered 2014-12-10: 2 mg via INTRAVENOUS

## 2014-12-10 MED ORDER — SIMVASTATIN 40 MG PO TABS
40.0000 mg | ORAL_TABLET | Freq: Every day | ORAL | Status: DC
  Administered 2014-12-10: 40 mg via ORAL
  Filled 2014-12-10 (×2): qty 1

## 2014-12-10 MED ORDER — BUPIVACAINE IN DEXTROSE 0.75-8.25 % IT SOLN
INTRATHECAL | Status: DC | PRN
Start: 2014-12-10 — End: 2014-12-10
  Administered 2014-12-10: 2 mL via INTRATHECAL

## 2014-12-10 MED ORDER — LIDOCAINE HCL (CARDIAC) 20 MG/ML IV SOLN
INTRAVENOUS | Status: DC | PRN
  Administered 2014-12-10: 100 mg via INTRAVENOUS

## 2014-12-10 MED ORDER — NEOSTIGMINE METHYLSULFATE 10 MG/10ML IV SOLN
INTRAVENOUS | Status: AC
  Filled 2014-12-10: qty 1

## 2014-12-10 MED ORDER — CEFAZOLIN SODIUM-DEXTROSE 2-3 GM-% IV SOLR
2.0000 g | Freq: Four times a day (QID) | INTRAVENOUS | Status: AC
  Administered 2014-12-10 – 2014-12-11 (×2): 2 g via INTRAVENOUS
  Filled 2014-12-10 (×2): qty 50

## 2014-12-10 MED ORDER — DEXAMETHASONE SODIUM PHOSPHATE 10 MG/ML IJ SOLN
INTRAMUSCULAR | Status: AC
  Filled 2014-12-10: qty 1

## 2014-12-10 MED ORDER — LACTATED RINGERS IV SOLN
INTRAVENOUS | Status: DC
  Administered 2014-12-10 (×2): 1000 mL via INTRAVENOUS
  Administered 2014-12-10: 13:00:00 via INTRAVENOUS

## 2014-12-10 MED ORDER — FENTANYL CITRATE (PF) 100 MCG/2ML IJ SOLN
INTRAMUSCULAR | Status: DC | PRN
  Administered 2014-12-10 (×2): 50 ug via INTRAVENOUS

## 2014-12-10 MED ORDER — ONDANSETRON HCL 4 MG PO TABS: 4.0000 mg | ORAL_TABLET | Freq: Four times a day (QID) | ORAL | Status: DC | PRN

## 2014-12-10 MED ORDER — METHOCARBAMOL 1000 MG/10ML IJ SOLN
500.0000 mg | Freq: Four times a day (QID) | INTRAVENOUS | Status: DC | PRN
  Filled 2014-12-10: qty 5

## 2014-12-10 MED ORDER — FENTANYL CITRATE (PF) 100 MCG/2ML IJ SOLN
INTRAMUSCULAR | Status: AC
  Filled 2014-12-10: qty 2

## 2014-12-10 MED ORDER — POTASSIUM CHLORIDE 2 MEQ/ML IV SOLN
100.0000 mL/h | INTRAVENOUS | Status: DC
  Administered 2014-12-10: 100 mL/h via INTRAVENOUS
  Filled 2014-12-10 (×5): qty 1000

## 2014-12-10 MED ORDER — CHLORHEXIDINE GLUCONATE 4 % EX LIQD: 60.0000 mL | Freq: Once | CUTANEOUS | Status: DC

## 2014-12-10 MED ORDER — RIVAROXABAN 10 MG PO TABS
10.0000 mg | ORAL_TABLET | ORAL | Status: DC
  Administered 2014-12-11: 10 mg via ORAL
  Filled 2014-12-10 (×2): qty 1

## 2014-12-10 MED ORDER — TRANEXAMIC ACID 1000 MG/10ML IV SOLN
2000.0000 mg | INTRAVENOUS | Status: DC | PRN
  Administered 2014-12-10: 2000 mg via TOPICAL

## 2014-12-10 MED ORDER — MENTHOL 3 MG MT LOZG: 1.0000 | LOZENGE | OROMUCOSAL | Status: DC | PRN

## 2014-12-10 MED ORDER — LIDOCAINE HCL (CARDIAC) 20 MG/ML IV SOLN
INTRAVENOUS | Status: AC
  Filled 2014-12-10: qty 5

## 2014-12-10 MED ORDER — METOPROLOL SUCCINATE ER 100 MG PO TB24
100.0000 mg | ORAL_TABLET | Freq: Every day | ORAL | Status: DC
  Administered 2014-12-10: 100 mg via ORAL
  Filled 2014-12-10 (×2): qty 1

## 2014-12-10 MED ORDER — PHENYLEPHRINE 40 MCG/ML (10ML) SYRINGE FOR IV PUSH (FOR BLOOD PRESSURE SUPPORT)
PREFILLED_SYRINGE | INTRAVENOUS | Status: AC
  Filled 2014-12-10: qty 10

## 2014-12-10 MED ORDER — SODIUM CHLORIDE 0.9 % IR SOLN
Status: DC | PRN
  Administered 2014-12-10: 1000 mL

## 2014-12-10 MED ORDER — HYDROCODONE-ACETAMINOPHEN 7.5-325 MG PO TABS
1.0000 | ORAL_TABLET | ORAL | Status: DC
  Administered 2014-12-10 – 2014-12-11 (×6): 2 via ORAL
  Filled 2014-12-10 (×6): qty 2

## 2014-12-10 MED ORDER — PHENYLEPHRINE HCL 10 MG/ML IJ SOLN
INTRAMUSCULAR | Status: AC
  Filled 2014-12-10: qty 1

## 2014-12-10 MED ORDER — METOCLOPRAMIDE HCL 10 MG PO TABS: 5.0000 mg | ORAL_TABLET | Freq: Three times a day (TID) | ORAL | Status: DC | PRN

## 2014-12-10 MED ORDER — TRANEXAMIC ACID 1000 MG/10ML IV SOLN
2000.0000 mg | Freq: Once | INTRAVENOUS | Status: DC
  Filled 2014-12-10: qty 20

## 2014-12-10 MED ORDER — POLYETHYLENE GLYCOL 3350 17 G PO PACK
17.0000 g | PACK | Freq: Two times a day (BID) | ORAL | Status: DC
  Administered 2014-12-10 – 2014-12-11 (×2): 17 g via ORAL

## 2014-12-10 MED ORDER — METOCLOPRAMIDE HCL 5 MG/ML IJ SOLN: 5.0000 mg | Freq: Three times a day (TID) | INTRAMUSCULAR | Status: DC | PRN

## 2014-12-10 MED ORDER — ONDANSETRON HCL 4 MG/2ML IJ SOLN: 4.0000 mg | Freq: Four times a day (QID) | INTRAMUSCULAR | Status: DC | PRN

## 2014-12-10 MED ORDER — BISACODYL 10 MG RE SUPP: 10.0000 mg | Freq: Every day | RECTAL | Status: DC | PRN

## 2014-12-10 SURGICAL SUPPLY — 42 items
BAG DECANTER FOR FLEXI CONT (MISCELLANEOUS) ×3 IMPLANT
BAG ZIPLOCK 12X15 (MISCELLANEOUS) IMPLANT
CAPT HIP TOTAL 2 ×3 IMPLANT
COVER PERINEAL POST (MISCELLANEOUS) ×3 IMPLANT
DRAPE C-ARM 42X120 X-RAY (DRAPES) ×3 IMPLANT
DRAPE STERI IOBAN 125X83 (DRAPES) ×3 IMPLANT
DRAPE U-SHAPE 47X51 STRL (DRAPES) ×9 IMPLANT
DRSG AQUACEL AG ADV 3.5X10 (GAUZE/BANDAGES/DRESSINGS) ×3 IMPLANT
DURAPREP 26ML APPLICATOR (WOUND CARE) ×3 IMPLANT
ELECT BLADE TIP CTD 4 INCH (ELECTRODE) ×3 IMPLANT
ELECT PENCIL ROCKER SW 15FT (MISCELLANEOUS) IMPLANT
ELECT REM PT RETURN 15FT ADLT (MISCELLANEOUS) IMPLANT
ELECT REM PT RETURN 9FT ADLT (ELECTROSURGICAL) ×3
ELECTRODE REM PT RTRN 9FT ADLT (ELECTROSURGICAL) ×1 IMPLANT
FACESHIELD WRAPAROUND (MASK) ×12 IMPLANT
GLOVE BIOGEL PI IND STRL 7.5 (GLOVE) ×1 IMPLANT
GLOVE BIOGEL PI IND STRL 8.5 (GLOVE) ×1 IMPLANT
GLOVE BIOGEL PI INDICATOR 7.5 (GLOVE) ×2
GLOVE BIOGEL PI INDICATOR 8.5 (GLOVE) ×2
GLOVE ECLIPSE 8.0 STRL XLNG CF (GLOVE) ×6 IMPLANT
GLOVE ORTHO TXT STRL SZ7.5 (GLOVE) ×3 IMPLANT
GOWN SPEC L3 XXLG W/TWL (GOWN DISPOSABLE) IMPLANT
GOWN STRL REUS W/TWL LRG LVL3 (GOWN DISPOSABLE) ×9 IMPLANT
HOLDER FOLEY CATH W/STRAP (MISCELLANEOUS) ×3 IMPLANT
KIT BASIN OR (CUSTOM PROCEDURE TRAY) ×3 IMPLANT
LIQUID BAND (GAUZE/BANDAGES/DRESSINGS) ×3 IMPLANT
NDL SAFETY ECLIPSE 18X1.5 (NEEDLE) ×1 IMPLANT
NEEDLE HYPO 18GX1.5 SHARP (NEEDLE) ×2
PACK TOTAL JOINT (CUSTOM PROCEDURE TRAY) ×3 IMPLANT
PEN SKIN MARKING BROAD (MISCELLANEOUS) ×3 IMPLANT
SAW OSC TIP CART 19.5X105X1.3 (SAW) ×3 IMPLANT
SUT MNCRL AB 4-0 PS2 18 (SUTURE) ×3 IMPLANT
SUT VIC AB 1 CT1 36 (SUTURE) ×9 IMPLANT
SUT VIC AB 2-0 CT1 27 (SUTURE) ×4
SUT VIC AB 2-0 CT1 TAPERPNT 27 (SUTURE) ×2 IMPLANT
SUT VLOC 180 0 24IN GS25 (SUTURE) ×3 IMPLANT
SYR 50ML LL SCALE MARK (SYRINGE) ×3 IMPLANT
TOWEL OR 17X26 10 PK STRL BLUE (TOWEL DISPOSABLE) ×3 IMPLANT
TOWEL OR NON WOVEN STRL DISP B (DISPOSABLE) IMPLANT
TRAY FOLEY CATH 16FR SILVER (SET/KITS/TRAYS/PACK) ×3 IMPLANT
WATER STERILE IRR 1500ML POUR (IV SOLUTION) ×3 IMPLANT
YANKAUER SUCT BULB TIP 10FT TU (MISCELLANEOUS) ×3 IMPLANT

## 2014-12-10 NOTE — Transfer of Care (Signed)
Immediate Anesthesia Transfer of Care Note  Patient: David Cowan  Procedure(s) Performed: Procedure(s): LEFT TOTAL HIP ARTHROPLASTY ANTERIOR APPROACH (Left)  Patient Location: PACU  Anesthesia Type:GENERAL and SPINAL  Level of Consciousness:  sedated, patient cooperative and responds to stimulation  Airway & Oxygen Therapy:Patient Spontanous Breathing and Patient connected to face mask oxgen  Post-op Assessment:  Report given to PACU RN and Post -op Vital signs reviewed and stable  Post vital signs:  Reviewed and stable  Last Vitals:  Filed Vitals:   12/10/14 0820  BP: 164/80  Pulse: 84  Temp: 36.9 C  Resp: 18    Complications: No apparent anesthesia complications

## 2014-12-10 NOTE — Anesthesia Procedure Notes (Addendum)
Spinal Patient location during procedure: OR End time: 12/10/2014 11:53 AM Staffing Anesthesiologist: Franne Grip Resident/CRNA: Maxwell Caul Performed by: anesthesiologist and resident/CRNA  Preanesthetic Checklist Completed: patient identified, site marked, surgical consent, pre-op evaluation, timeout performed, IV checked, risks and benefits discussed and monitors and equipment checked Spinal Block Patient position: sitting Prep: Betadine Patient monitoring: heart rate, continuous pulse ox and blood pressure Injection technique: single-shot Needle Needle type: Sprotte  Needle gauge: 24 G Additional Notes Expiration date of kit checked and confirmed. Patient tolerated procedure well, without complications. Expiration date 03-2016. LOT # [id number].    Procedure Name: Intubation Date/Time: 12/10/2014 12:30 PM Performed by: Maxwell Caul Pre-anesthesia Checklist: Patient identified, Emergency Drugs available, Suction available and Patient being monitored Patient Re-evaluated:Patient Re-evaluated prior to inductionOxygen Delivery Method: Circle system utilized Preoxygenation: Pre-oxygenation with 100% oxygen Intubation Type: IV induction Ventilation: Oral airway inserted - appropriate to patient size Laryngoscope Size: Mac and 4 Grade View: Grade II Tube type: Oral Tube size: 7.5 mm Number of attempts: 1 Placement Confirmation: ETT inserted through vocal cords under direct vision Secured at: 21 cm Tube secured with: Tape Dental Injury: Teeth and Oropharynx as per pre-operative assessment

## 2014-12-10 NOTE — Interval H&P Note (Signed)
History and Physical Interval Note:  12/10/2014 10:30 AM  David Cowan  has presented today for surgery, with the diagnosis of LEFT HIP OA  The various methods of treatment have been discussed with the patient and family. After consideration of risks, benefits and other options for treatment, the patient has consented to  Procedure(s): LEFT TOTAL HIP ARTHROPLASTY ANTERIOR APPROACH (Left) as a surgical intervention .  The patient's history has been reviewed, patient examined, no change in status, stable for surgery.  I have reviewed the patient's chart and labs.  Questions were answered to the patient's satisfaction.     [name]

## 2014-12-10 NOTE — Op Note (Signed)
NAME:  David Cowan                ACCOUNT NO.: [id number]      MEDICAL RECORD NO.: [id number]      FACILITY:  [facility]      PHYSICIAN:  Paralee Cancel D  DATE OF BIRTH:  1961-03-22     DATE OF PROCEDURE:  12/10/2014                                 OPERATIVE REPORT         PREOPERATIVE DIAGNOSIS: Left  hip osteoarthritis.      POSTOPERATIVE DIAGNOSIS:  Left hip osteoarthritis.      PROCEDURE:  Left total hip replacement through an anterior approach   utilizing DePuy THR system, component size 80mm pinnacle cup, a size 36+4 neutral   Altrex liner, a size 7 Hi Tri Lock stem with a 36+5 delta ceramic   ball.      SURGEON:  Pietro Cassis. Alvan Dame, M.D.      ASSISTANT:  Nehemiah Massed, PA-C     ANESTHESIA:  General and Spinal.      SPECIMENS:  None.      COMPLICATIONS:  None.      BLOOD LOSS:  350 cc     DRAINS:  None.      INDICATION OF THE PROCEDURE:  David Cowan is a 54 y.o. male who had   presented to office for evaluation of left hip pain.  Radiographs revealed   progressive degenerative changes with bone-on-bone   articulation to the  hip joint.  The patient had painful limited range of   motion significantly affecting their overall quality of life.  The patient was failing to    respond to conservative measures, and at this point was ready   to proceed with more definitive measures.  The patient has noted progressive   degenerative changes in his hip, progressive problems and dysfunction   with regarding the hip prior to surgery.  Consent was obtained for   benefit of pain relief.  Specific risk of infection, DVT, component   failure, dislocation, need for revision surgery, as well discussion of   the anterior versus posterior approach were reviewed.  Consent was   obtained for benefit of anterior pain relief through an anterior   approach.      PROCEDURE IN DETAIL:  The patient was brought to operative theater.   Once adequate anesthesia,  preoperative antibiotics, 2gm of Ancef and 10mg  of Decadron administered.   The patient was positioned supine on the OSI Hanna table.  Once adequate   padding of boney process was carried out, we had predraped out the hip, and  used fluoroscopy to confirm orientation of the pelvis and position.      The left hip was then prepped and draped from proximal iliac crest to   mid thigh with shower curtain technique.      Time-out was performed identifying the patient, planned procedure, and   extremity.     An incision was then made 2 cm distal and lateral to the   anterior superior iliac spine extending over the orientation of the   tensor fascia lata muscle and sharp dissection was carried down to the   fascia of the muscle and protractor placed in the soft tissues.      The fascia was then incised.  The  muscle belly was identified and swept   laterally and retractor placed along the superior neck.  Following   cauterization of the circumflex vessels and removing some pericapsular   fat, a second cobra retractor was placed on the inferior neck.  A third   retractor was placed on the anterior acetabulum after elevating the   anterior rectus.  A L-capsulotomy was along the line of the   superior neck to the trochanteric fossa, then extended proximally and   distally.  Tag sutures were placed and the retractors were then placed   intracapsular.  We then identified the trochanteric fossa and   orientation of my neck cut, confirmed this radiographically   and then made a neck osteotomy with the femur on traction.  The femoral   head was removed without difficulty or complication.  Traction was let   off and retractors were placed posterior and anterior around the   acetabulum.      The labrum and foveal tissue were debrided.  I began reaming with a 53mm   reamer and reamed up to 69mm reamer with good bony bed preparation and a 10mm   cup was chosen.  The final 86mm Pinnacle cup was then  impacted under fluoroscopy  to confirm the depth of penetration and orientation with respect to   abduction.  A screw was placed followed by the hole eliminator.  The final   36+4 neutral Altrex liner was impacted with good visualized rim fit.  The cup was positioned anatomically within the acetabular portion of the pelvis.      At this point, the femur was rolled at 80 degrees.  Further capsule was   released off the inferior aspect of the femoral neck.  I then   released the superior capsule proximally.  The hook was placed laterally   along the femur and elevated manually and held in position with the bed   hook.  The leg was then extended and adducted with the leg rolled to 100   degrees of external rotation.  Once the proximal femur was fully   exposed, I used a box osteotome to set orientation.  I then began   broaching with the starting chili pepper broach and passed this by hand and then broached up to 7.  With the 7 broach in place I chose a high offset neck and did trial reductions first with a +1.5 head ball then the +5 head ball.  The offset was appropriate, leg lengths   appeared to be equal, confirmed radiographically.   Given these findings, I went ahead and dislocated the hip, repositioned all   retractors and positioned the right hip in the extended and abducted position.  The final 7 Hi Tri Lock stem was   chosen and it was impacted down to the level of neck cut.  Based on this   and the trial reduction, a 36+5 delta ceramic ball was chosen and   impacted onto a clean and dry trunnion, and the hip was reduced.  The   hip had been irrigated throughout the case again at this point.  I did   reapproximate the superior capsular leaflet to the anterior leaflet   using #1 Vicryl.  The fascia of the   tensor fascia lata muscle was then reapproximated using #1 Vicryl and #0 V-lock sutures.  The   remaining wound was closed with 2-0 Vicryl and running 4-0 Monocryl.   The hip was  cleaned, dried, and dressed  sterilely using Dermabond and   Aquacel dressing.  She was then brought   to recovery room in stable condition tolerating the procedure well.    Nehemiah Massed, PA-C was present for the entirety of the case involved from   preoperative positioning, perioperative retractor management, general   facilitation of the case, as well as primary wound closure as assistant.            Pietro Cassis Alvan Dame, M.D.        12/10/2014 1:21 PM

## 2014-12-10 NOTE — Anesthesia Postprocedure Evaluation (Signed)
  Anesthesia Post-op Note  Patient: David Cowan  Procedure(s) Performed: Procedure(s) (LRB): LEFT TOTAL HIP ARTHROPLASTY ANTERIOR APPROACH (Left)  Patient Location: PACU  Anesthesia Type: General and spinal (spinal worked fine but patient unable to lie still and so general used)  Level of Consciousness: awake and alert   Airway and Oxygen Therapy: Patient Spontanous Breathing  Post-op Pain: mild  Post-op Assessment: Post-op Vital signs reviewed, Patient's Cardiovascular Status Stable, Respiratory Function Stable, Patent Airway and No signs of Nausea or vomiting. Spinal regression observed in PACU.  Last Vitals:  Filed Vitals:   12/10/14 1445  BP: 136/67  Pulse: 66  Temp: 36.6 C  Resp: 16    Post-op Vital Signs: stable   Complications: No apparent anesthesia complications

## 2014-12-10 NOTE — Anesthesia Preprocedure Evaluation (Addendum)
Anesthesia Evaluation  Patient identified by MRN, date of birth, ID band Patient awake    Reviewed: Allergy & Precautions, NPO status , Patient's Chart, lab work & pertinent test results  Airway Mallampati: II  TM Distance: >3 FB Neck ROM: Full    Dental no notable dental hx.    Pulmonary Current Smoker,  breath sounds clear to auscultation  Pulmonary exam normal       Cardiovascular Exercise Tolerance: Good hypertension, Pt. on medications and Pt. on home beta blockers + CAD and + Past MI Normal cardiovascular examRhythm:Regular Rate:Normal  Denies current cardiopulmonary symptoms.   Neuro/Psych negative neurological ROS  negative psych ROS   GI/Hepatic negative GI ROS, Neg liver ROS,   Endo/Other  negative endocrine ROS  Renal/GU negative Renal ROS  negative genitourinary   Musculoskeletal  (+) Arthritis -,   Abdominal (+) + obese,   Peds negative pediatric ROS (+)  Hematology negative hematology ROS (+)   Anesthesia Other Findings   Reproductive/Obstetrics negative OB ROS                            Anesthesia Physical Anesthesia Plan  ASA: III  Anesthesia Plan: Spinal   Post-op Pain Management:    Induction: Intravenous  Airway Management Planned:   Additional Equipment:   Intra-op Plan:   Post-operative Plan: Extubation in OR  Informed Consent: I have reviewed the patients History and Physical, chart, labs and discussed the procedure including the risks, benefits and alternatives for the proposed anesthesia with the patient or authorized representative who has indicated his/her understanding and acceptance.   Dental advisory given  Plan Discussed with: CRNA  Anesthesia Plan Comments: (Discussed risks and benefits of and differences between spinal and general. Discussed risks of spinal including headache, backache, failure, bleeding, infection, and nerve damage. Patient  consents to spinal. Questions answered. Coagulation studies and platelet count acceptable.)        Anesthesia Quick Evaluation

## 2014-12-10 NOTE — Plan of Care (Signed)
Problem: Consults Goal: Diagnosis- Total Joint Replacement Left anterior hip     

## 2014-12-11 ENCOUNTER — Encounter (HOSPITAL_COMMUNITY): Payer: Self-pay | Admitting: Orthopedic Surgery

## 2014-12-11 DIAGNOSIS — E669 Obesity, unspecified: Secondary | ICD-10-CM | POA: Diagnosis present

## 2014-12-11 LAB — BASIC METABOLIC PANEL
Anion gap: 8 (ref 5–15)
BUN: 25 mg/dL — AB (ref 6–20)
CHLORIDE: 101 mmol/L (ref 101–111)
CO2: 28 mmol/L (ref 22–32)
CREATININE: 1.34 mg/dL — AB (ref 0.61–1.24)
Calcium: 8.3 mg/dL — ABNORMAL LOW (ref 8.9–10.3)
GFR calc Af Amer: >60 mL/min (ref >60)
GFR calc non Af Amer: 59 mL/min — ABNORMAL LOW (ref >60)
GLUCOSE: 148 mg/dL — AB (ref 65–99)
Potassium: 4.8 mmol/L (ref 3.5–5.1)
Sodium: 137 mmol/L (ref 135–145)

## 2014-12-11 LAB — CBC
HCT: 37.1 % — ABNORMAL LOW (ref 39.0–52.0)
Hemoglobin: 11.8 g/dL — ABNORMAL LOW (ref 13.0–17.0)
MCH: 29.5 pg (ref 26.0–34.0)
MCHC: 31.8 g/dL (ref 30.0–36.0)
MCV: 92.8 fL (ref 78.0–100.0)
Platelets: 244 10*3/uL (ref 150–400)
RBC: 4 MIL/uL — AB (ref 4.22–5.81)
RDW: 13.8 % (ref 11.5–15.5)
WBC: 15.9 10*3/uL — ABNORMAL HIGH (ref 4.0–10.5)

## 2014-12-11 MED ORDER — METHOCARBAMOL 500 MG PO TABS: 500.0000 mg | ORAL_TABLET | Freq: Four times a day (QID) | ORAL | Status: DC | PRN

## 2014-12-11 MED ORDER — HYDROCODONE-ACETAMINOPHEN 7.5-325 MG PO TABS: 1.0000 | ORAL_TABLET | ORAL | Status: DC | PRN

## 2014-12-11 MED ORDER — POLYETHYLENE GLYCOL 3350 17 G PO PACK: 17.0000 g | PACK | Freq: Two times a day (BID) | ORAL | Status: DC

## 2014-12-11 MED ORDER — DOCUSATE SODIUM 100 MG PO CAPS: 100.0000 mg | ORAL_CAPSULE | Freq: Two times a day (BID) | ORAL | Status: DC

## 2014-12-11 MED ORDER — FERROUS SULFATE 325 (65 FE) MG PO TABS: 325.0000 mg | ORAL_TABLET | Freq: Three times a day (TID) | ORAL | Status: DC

## 2014-12-11 MED ORDER — ASPIRIN EC 325 MG PO TBEC: 325.0000 mg | DELAYED_RELEASE_TABLET | Freq: Two times a day (BID) | ORAL | Status: AC

## 2014-12-11 NOTE — Evaluation (Signed)
Physical Therapy Evaluation Patient Details Name: David Cowan MRN: [id number] DOB: 08-07-1960 Today's Date: 12/11/2014   History of Present Illness  L THR  Clinical Impression  Pt s/p L THR presents with decreased L LE strength/ROM and post op pain limiting functional mobility.  Pt should progress well to dc home with family assist and HHPT follow up.    Follow Up Recommendations Home health PT    Equipment Recommendations  None recommended by PT    Recommendations for Other Services OT consult     Precautions / Restrictions Precautions Precautions: Fall Restrictions Weight Bearing Restrictions: No Other Position/Activity Restrictions: WBAT      Mobility  Bed Mobility               General bed mobility comments: NT - OOB with OT  Transfers Overall transfer level: Needs assistance Equipment used: None Transfers: Sit to/from Stand Sit to Stand: Min assist         General transfer comment: cues for LE management and use of UEs to self assist  Ambulation/Gait Ambulation/Gait assistance: Min assist;Min guard Ambulation Distance (Feet): 76 Feet Assistive device: Rolling walker (2 wheeled) Gait Pattern/deviations: Step-to pattern;Decreased step length - right;Decreased step length - left;Shuffle;Trunk flexed Gait velocity: decr Gait velocity interpretation: Below normal speed for age/gender General Gait Details: cues for posture, position from RW, ER on L and initial sequence  Stairs            Wheelchair Mobility    Modified Rankin (Stroke Patients Only)       Balance                                             Pertinent Vitals/Pain Pain Assessment: 0-10 Pain Score: 4  Pain Location: L hip Pain Descriptors / Indicators: Aching;Sore Pain Intervention(s): Limited activity within patient's tolerance;Monitored during session;Premedicated before session;Ice applied    Home Living Family/patient expects to be discharged to::  Private residence Living Arrangements: Spouse/significant other;Children Available Help at Discharge: Family Type of Home: House Home Access: Stairs to enter Entrance Stairs-Rails: Left Entrance Stairs-Number of Steps: 4+1 Home Layout: One level Home Equipment: Environmental consultant - 4 wheels Additional Comments: May have 2 wheeled RW - family checking    Prior Function Level of Independence: Needs assistance      ADL's / Homemaking Assistance Needed: assist with LB dressing        Hand Dominance        Extremity/Trunk Assessment   Upper Extremity Assessment: Overall WFL for tasks assessed           Lower Extremity Assessment: LLE deficits/detail   LLE Deficits / Details: hip strength 2+/5 with AAROM at hip to 80 flex and 15 abd  Cervical / Trunk Assessment: Normal  Communication   Communication: No difficulties  Cognition Arousal/Alertness: Awake/alert Behavior During Therapy: WFL for tasks assessed/performed Overall Cognitive Status: Within Functional Limits for tasks assessed                      General Comments      Exercises Total Joint Exercises Ankle Circles/Pumps: AROM;Both;20 reps;Supine Quad Sets: AROM;Both;10 reps;Supine Heel Slides: AAROM;Left;20 reps;Supine Hip ABduction/ADduction: AAROM;Left;15 reps;Supine      Assessment/Plan    PT Assessment Patient needs continued PT services  PT Diagnosis Difficulty walking   PT Problem List Decreased strength;Decreased range of motion;Decreased  activity tolerance;Decreased mobility;Decreased knowledge of use of DME;Obesity;Pain  PT Treatment Interventions DME instruction;Gait training;Stair training;Functional mobility training;Therapeutic activities;Therapeutic exercise;Patient/family education   PT Goals (Current goals can be found in the Care Plan section) Acute Rehab PT Goals Patient Stated Goal: Resume previous lifestyle with decreased pain PT Goal Formulation: With patient Time For Goal  Achievement: 12/14/14 Potential to Achieve Goals: Good    Frequency 7X/week   Barriers to discharge        Co-evaluation               End of Session Equipment Utilized During Treatment: Gait belt Activity Tolerance: Patient tolerated treatment well Patient left: in chair;with call bell/phone within reach;with family/visitor present Nurse Communication: Mobility status         Time: 1410-3013 PT Time Calculation (min) (ACUTE ONLY): 30 min   Charges:   PT Evaluation $Initial PT Evaluation Tier I: 1 Procedure PT Treatments $Therapeutic Exercise: 8-22 mins   PT G Codes:        Latondra Gebhart 22-Dec-2014, 10:26 AM

## 2014-12-11 NOTE — Progress Notes (Signed)
Pt to d/c home with [facility] home [facility]. DME delivered to room prior to d/c. AVS reviewed and "My Chart" discussed with pt. Pt capable of verbalizing medications, dressing changes, signs and symptoms of infection, and follow-up appointments. Remains hemodynamically stable. No signs and symptoms of distress. Educated pt to return to ER in the case of SOB, dizziness, or chest pain.

## 2014-12-11 NOTE — Progress Notes (Signed)
     Subjective: 1 Day Post-Op Procedure(s) (LRB): LEFT TOTAL HIP ARTHROPLASTY ANTERIOR APPROACH (Left)   Patient reports pain as mild, pain controlled.  Having some muscle spasms. No events throughout the night. Ready to be discharged home if he does well with PT.   Objective:   VITALS:   Filed Vitals:   12/11/14 0522  BP: 112/59  Pulse: 50  Temp: 98 F (36.7 C)  Resp: 16    Dorsiflexion/Plantar flexion intact Incision: dressing C/D/I No cellulitis present Compartment soft  LABS  Recent Labs  12/11/14 0408  HGB 11.8*  HCT 37.1*  WBC 15.9*  PLT 244     Recent Labs  12/11/14 0408  NA 137  K 4.8  BUN 25*  CREATININE 1.34*  GLUCOSE 148*     Assessment/Plan: 1 Day Post-Op Procedure(s) (LRB): LEFT TOTAL HIP ARTHROPLASTY ANTERIOR APPROACH (Left) Foley cath d/c'ed Advance diet Up with therapy D/C IV fluids Discharge home with home health  Follow up in 2 weeks at [facility] Follow up with OLIN,Brannon Levene D in 2 weeks.  Contact information:  [facility] [address], [place] [phone]    Obese (BMI 30-39.9) Estimated body mass index is 36.46 kg/(m^2) as calculated from the following:   Height as of this encounter: 5\' 9"  (1.753 m).   Weight as of this encounter: 112.038 kg (247 lb). Patient also counseled that weight may inhibit the healing process Patient counseled that losing weight will help with future health issues        [place] Pugh. Lyndsy Gilberto   PAC  12/11/2014, 8:18 AM

## 2014-12-11 NOTE — Evaluation (Signed)
Occupational Therapy Evaluation Patient Details Name: David Cowan MRN: [id number] DOB: 1961/03/13 Today's Date: 12/11/2014    History of Present Illness L THR   Clinical Impression   Pt doing well. Practiced up to 3in1 with walker and family present. Will follow for OT education on safety to progress independence with ADL. Pt may d/c later today.    Follow Up Recommendations  No OT follow up;Supervision/Assistance - 24 hour    Equipment Recommendations  3 in 1 bedside comode    Recommendations for Other Services       Precautions / Restrictions Precautions Precautions: Fall Restrictions Weight Bearing Restrictions: No Other Position/Activity Restrictions: WBAT      Mobility Bed Mobility Overal bed mobility: Needs Assistance Bed Mobility: Supine to Sit     Supine to sit: Min assist;HOB elevated     General bed mobility comments: min assist for L LE  Transfers Overall transfer level: Needs assistance Equipment used: Rolling walker (2 wheeled) Transfers: Sit to/from Stand Sit to Stand: Min assist         General transfer comment: cues for hand placement and LE management.    Balance                                            ADL Overall ADL's : Needs assistance/impaired Eating/Feeding: Independent;Sitting   Grooming: Wash/dry hands;Set up;Sitting   Upper Body Bathing: Set up;Sitting   Lower Body Bathing: Moderate assistance;Sit to/from stand   Upper Body Dressing : Set up;Sitting   Lower Body Dressing: Maximal assistance;Sit to/from stand   Toilet Transfer: Minimal assistance;Ambulation;BSC;RW   Toileting- Clothing Manipulation and Hygiene: Minimal assistance;Sit to/from stand         General ADL Comments: Pt has a tub at home and a walker may or may not go into the bathroom so educated on 3in1 use as BSC if the walker wont fit into the bathroom and pt plans to sponge bathe initially. Discussed option of a tubseat if  needed and let HH assess when pt is ready to step over tub. Educated on AE options but pt plans to have assist with LB self care. Educated on sequence and safety considerations with LB self care. Pt did well with up to 3in1 across the room. daughter present.      Vision     Perception     Praxis      Pertinent Vitals/Pain Pain Assessment: 0-10 Pain Score: 4  Pain Location: L hip Pain Descriptors / Indicators: Aching;Sore Pain Intervention(s): Limited activity within patient's tolerance;Monitored during session;Premedicated before session;Ice applied     Hand Dominance     Extremity/Trunk Assessment Upper Extremity Assessment Upper Extremity Assessment: Overall WFL for tasks assessed      Cervical / Trunk Assessment Cervical / Trunk Assessment: Normal   Communication Communication Communication: No difficulties   Cognition Arousal/Alertness: Awake/alert Behavior During Therapy: WFL for tasks assessed/performed Overall Cognitive Status: Within Functional Limits for tasks assessed                     General Comments       Exercises Exercises: Total Joint     Shoulder Instructions      Home Living Family/patient expects to be discharged to:: Private residence Living Arrangements: Spouse/significant other;Children Available Help at Discharge: Family Type of Home: House Home Access: Stairs to enter [employer]  of Steps: 4+1 Entrance Stairs-Rails: Left Home Layout: One level     Bathroom Shower/Tub: Teacher, early years/pre: Standard     Home Equipment: Environmental consultant - 4 wheels   Additional Comments: May have 2 wheeled RW - family checking      Prior Functioning/Environment Level of Independence: Needs assistance    ADL's / Homemaking Assistance Needed: assist with LB dressing        OT Diagnosis: Generalized weakness   OT Problem List: Decreased strength;Decreased knowledge of use of DME or AE   OT Treatment/Interventions:  Self-care/ADL training;Patient/family education;Therapeutic activities;DME and/or AE instruction    OT Goals(Current goals can be found in the care plan section) Acute Rehab OT Goals Patient Stated Goal: decrease pain and increase independence.  OT Goal Formulation: With patient Time For Goal Achievement: 12/18/14 Potential to Achieve Goals: Good  OT Frequency: Min 2X/week   Barriers to D/C:            Co-evaluation              End of Session Equipment Utilized During Treatment: Gait belt;Rolling walker  Activity Tolerance: Patient tolerated treatment well Patient left: in chair;with call bell/phone within reach;with family/visitor present   Time: 0912-0940 OT Time Calculation (min): 28 min Charges:  OT General Charges $OT Visit: 1 Procedure OT Evaluation $Initial OT Evaluation Tier I: 1 Procedure OT Treatments $Therapeutic Activity: 8-22 mins G-Codes:    Jules Schick  [phone] 12/11/2014, 10:53 AM

## 2014-12-11 NOTE — Care Management Note (Addendum)
Case Management Note  Patient Details  Name: David Cowan MRN: [id number] Date of Birth: August 21, 1960  Subjective/Objective:                  LEFT TOTAL HIP ARTHROPLASTY ANTERIOR APPROACH (Left)  Action/Plan:  Discharge planning Expected Discharge Date:  12/11/14               Expected Discharge Plan:  [place]  In-House Referral:     Discharge planning Services  CM Consult  Post Acute Care Choice:  Home Health Choice offered to:  Patient  DME Arranged:  3-N-1, Walker rolling DME Agency:  [place]:  PT [place] Agency:  [place]  Status of Service:  Completed, signed off  Medicare Important Message Given:    Date Medicare IM Given:    Medicare IM give by:    Date Additional Medicare IM Given:    Additional Medicare Important Message give by:     If discussed at [place] of Stay Meetings, dates discussed:    Additional Comments: RN called CM to please arrange for rolling walker to be returned as pt has one from home.  CM texted [place] DME rep, Lecretia to please retrieve rolling walker from room as pt no longer needs it.  NO other CM needs were communicated.   CM met with pt in room to offer choice of home health agency.  Pt chooses [facility] to render HHPT.  Address and contact information verified by pt. Referral given to [facility] rep, (on unit).  CM called [facility] DME rep, Lecretia to please deliver the 3n1 and rolling walker to room prior to discharge.  No other CM needs were communicated. Dellie Catholic, RN 12/11/2014, 12:58 PM

## 2014-12-11 NOTE — Progress Notes (Signed)
Physical Therapy Treatment Patient Details Name: David Cowan MRN: [id number] DOB: Aug 20, 1960 Today's Date: 01-07-15    History of Present Illness L THR    PT Comments    Pt progressing well and eager for dc home.  Reviewed stairs and car transfers with pt and family.  Follow Up Recommendations  Home health PT     Equipment Recommendations  None recommended by PT    Recommendations for Other Services OT consult     Precautions / Restrictions Precautions Precautions: Fall Restrictions Weight Bearing Restrictions: No Other Position/Activity Restrictions: WBAT    Mobility  Bed Mobility                  Transfers Overall transfer level: Needs assistance Equipment used: None Transfers: Sit to/from Stand Sit to Stand: Supervision         General transfer comment: cues for hand placement and LE management.  Ambulation/Gait Ambulation/Gait assistance: Min guard;Supervision Ambulation Distance (Feet): 125 Feet (and 25' into bathroom) Assistive device: Rolling walker (2 wheeled) Gait Pattern/deviations: Step-to pattern;Decreased step length - right;Decreased step length - left;Shuffle;Trunk flexed Gait velocity: decr   General Gait Details: cues for posture, position from RW, ER on L and initial sequence   Stairs Stairs: Yes Stairs assistance: Min assist Stair Management: No rails;One rail Left;Step to pattern;Backwards;Forwards;With walker;With crutches Number of Stairs: 10 General stair comments: 4 steps twice with rail and crutch - family assisting on second attempt.  single step fwd and bkwd with RW.  Cues for sequence and foot/RW/crutch placement  Wheelchair Mobility    Modified Rankin (Stroke Patients Only)       Balance                                    Cognition Arousal/Alertness: Awake/alert Behavior During Therapy: WFL for tasks assessed/performed Overall Cognitive Status: Within Functional Limits for tasks assessed                      Exercises      General Comments        Pertinent Vitals/Pain Pain Assessment: 0-10 Pain Score: 4  Pain Location: L hip Pain Descriptors / Indicators: Aching;Sore;Tightness Pain Intervention(s): Limited activity within patient's tolerance;Monitored during session;Premedicated before session;Ice applied    Home Living                      Prior Function            PT Goals (current goals can now be found in the care plan section) Acute Rehab PT Goals Patient Stated Goal: decrease pain and increase independence.  PT Goal Formulation: With patient Time For Goal Achievement: 12/14/14 Potential to Achieve Goals: Good Progress towards PT goals: Progressing toward goals    Frequency  7X/week    PT Plan Current plan remains appropriate    Co-evaluation             End of Session Equipment Utilized During Treatment: Gait belt Activity Tolerance: Patient tolerated treatment well Patient left: in chair;with call bell/phone within reach;with family/visitor present     Time: 0258-5277 PT Time Calculation (min) (ACUTE ONLY): 45 min  Charges:  $Gait Training: 23-37 mins $Therapeutic Activity: 8-22 mins                    G Codes:      David Cowan 01-07-2015, 3:18  PM   

## 2014-12-11 NOTE — Discharge Instructions (Addendum)
INSTRUCTIONS AFTER JOINT REPLACEMENT  ° °o Remove items at home which could result in a fall. This includes throw rugs or furniture in walking pathways °o ICE to the affected joint every three hours while awake for 30 minutes at a time, for at least the first 3-5 days, and then as needed for pain and swelling.  Continue to use ice for pain and swelling. You may notice swelling that will progress down to the foot and ankle.  This is normal after surgery.  Elevate your leg when you are not up walking on it.   °o Continue to use the breathing machine you got in the hospital (incentive spirometer) which will help keep your temperature down.  It is common for your temperature to cycle up and down following surgery, especially at night when you are not up moving around and exerting yourself.  The breathing machine keeps your lungs expanded and your temperature down. ° ° °DIET:  As you were doing prior to hospitalization, we recommend a well-balanced diet. ° °DRESSING / WOUND CARE / SHOWERING ° °Keep the surgical dressing until follow up.  The dressing is water proof, so you can shower without any extra covering.  IF THE DRESSING FALLS OFF or the wound gets wet inside, change the dressing with sterile gauze.  Please use good hand washing techniques before changing the dressing.  Do not use any lotions or creams on the incision until instructed by your surgeon.   ° °ACTIVITY ° °o Increase activity slowly as tolerated, but follow the weight bearing instructions below.   °o No driving for 6 weeks or until further direction given by your physician.  You cannot drive while taking narcotics.  °o No lifting or carrying greater than 10 lbs. until further directed by your surgeon. °o Avoid periods of inactivity such as sitting longer than an hour when not asleep. This helps prevent blood clots.  °o You may return to work once you are authorized by your doctor.  ° ° ° °WEIGHT BEARING  ° °Weight bearing as tolerated with assist  device (walker, cane, etc) as directed, use it as long as suggested by your surgeon or therapist, typically at least 4-6 weeks. ° ° °EXERCISES ° °Results after joint replacement surgery are often greatly improved when you follow the exercise, range of motion and muscle strengthening exercises prescribed by your doctor. Safety measures are also important to protect the joint from further injury. Any time any of these exercises cause you to have increased pain or swelling, decrease what you are doing until you are comfortable again and then slowly increase them. If you have problems or questions, call your caregiver or physical therapist for advice.  ° °Rehabilitation is important following a joint replacement. After just a few days of immobilization, the muscles of the leg can become weakened and shrink (atrophy).  These exercises are designed to build up the tone and strength of the thigh and leg muscles and to improve motion. Often times heat used for twenty to thirty minutes before working out will loosen up your tissues and help with improving the range of motion but do not use heat for the first two weeks following surgery (sometimes heat can increase post-operative swelling).  ° °These exercises can be done on a training (exercise) mat, on the floor, on a table or on a bed. Use whatever works the best and is most comfortable for you.    Use music or television while you are exercising so that   the exercises are a pleasant break in your day. This will make your life better with the exercises acting as a break in your routine that you can look forward to.   Perform all exercises about fifteen times, three times per day or as directed.  You should exercise both the operative leg and the other leg as well. ° °Exercises include: °  °• Quad Sets - Tighten up the muscle on the front of the thigh (Quad) and hold for 5-10 seconds.   °• Straight Leg Raises - With your knee straight (if you were given a brace, keep it on),  lift the leg to 60 degrees, hold for 3 seconds, and slowly lower the leg.  Perform this exercise against resistance later as your leg gets stronger.  °• Leg Slides: Lying on your back, slowly slide your foot toward your buttocks, bending your knee up off the floor (only go as far as is comfortable). Then slowly slide your foot back down until your leg is flat on the floor again.  °• Angel Wings: Lying on your back spread your legs to the side as far apart as you can without causing discomfort.  °• Hamstring Strength:  Lying on your back, push your heel against the floor with your leg straight by tightening up the muscles of your buttocks.  Repeat, but this time bend your knee to a comfortable angle, and push your heel against the floor.  You may put a pillow under the heel to make it more comfortable if necessary.  ° °A rehabilitation program following joint replacement surgery can speed recovery and prevent re-injury in the future due to weakened muscles. Contact your doctor or a physical therapist for more information on knee rehabilitation.  ° ° °CONSTIPATION ° °Constipation is defined medically as fewer than three stools per week and severe constipation as less than one stool per week.  Even if you have a regular bowel pattern at home, your normal regimen is likely to be disrupted due to multiple reasons following surgery.  Combination of anesthesia, postoperative narcotics, change in appetite and fluid intake all can affect your bowels.  ° °YOU MUST use at least one of the following options; they are listed in order of increasing strength to get the job done.  They are all available over the counter, and you may need to use some, POSSIBLY even all of these options:   ° °Drink plenty of fluids (prune juice may be helpful) and high fiber foods °Colace 100 mg by mouth twice a day  °Senokot for constipation as directed and as needed Dulcolax (bisacodyl), take with full glass of water  °Miralax (polyethylene glycol)  once or twice a day as needed. ° °If you have tried all these things and are unable to have a bowel movement in the first 3-4 days after surgery call either your surgeon or your primary doctor.   ° °If you experience loose stools or diarrhea, hold the medications until you stool forms back up.  If your symptoms do not get better within 1 week or if they get worse, check with your doctor.  If you experience "the worst abdominal pain ever" or develop nausea or vomiting, please contact the office immediately for further recommendations for treatment. ° ° °ITCHING:  If you experience itching with your medications, try taking only a single pain pill, or even half a pain pill at a time.  You can also use Benadryl over the counter for itching or also to   help with sleep.  ° °TED HOSE STOCKINGS:  Use stockings on both legs until for at least 2 weeks or as directed by physician office. They may be removed at night for sleeping. ° °MEDICATIONS:  See your medication summary on the “After Visit Summary” that nursing will review with you.  You may have some home medications which will be placed on hold until you complete the course of blood thinner medication.  It is important for you to complete the blood thinner medication as prescribed. ° °PRECAUTIONS:  If you experience chest pain or shortness of breath - call 911 immediately for transfer to the hospital emergency department.  ° °If you develop a fever greater that 101 F, purulent drainage from wound, increased redness or drainage from wound, foul odor from the wound/dressing, or calf pain - CONTACT YOUR SURGEON.   °                                                °FOLLOW-UP APPOINTMENTS:  If you do not already have a post-op appointment, please call the office for an appointment to be seen by your surgeon.  Guidelines for how soon to be seen are listed in your “After Visit Summary”, but are typically between 1-4 weeks after surgery. ° °OTHER INSTRUCTIONS:  ° °Knee  Replacement:  Do not place pillow under knee, focus on keeping the knee straight while resting.  ° °MAKE SURE YOU:  °• Understand these instructions.  °• Get help right away if you are not doing well or get worse.  ° ° °Thank you for letting us be a part of your medical care team.  It is a privilege we respect greatly.  We hope these instructions will help you stay on track for a fast and full recovery!  °  ° °Information on my medicine - XARELTO® (Rivaroxaban) ° °This medication education was reviewed with me or my healthcare representative as part of my discharge preparation.  The pharmacist that spoke with me during my hospital stay was:  Jenet Durio E, RPH ° °Why was Xarelto® prescribed for you? °Xarelto® was prescribed for you to reduce the risk of blood clots forming after orthopedic surgery. The medical term for these abnormal blood clots is venous thromboembolism (VTE). ° °What do you need to know about xarelto® ? °Take your Xarelto® ONCE DAILY at the same time every day. °You may take it either with or without food. ° °If you have difficulty swallowing the tablet whole, you may crush it and mix in applesauce just prior to taking your dose. ° °Take Xarelto® exactly as prescribed by your doctor and DO NOT stop taking Xarelto® without talking to the doctor who prescribed the medication.  Stopping without other VTE prevention medication to take the place of Xarelto® may increase your risk of developing a clot. ° °After discharge, you should have regular check-up appointments with your healthcare provider that is prescribing your Xarelto®.   ° °What do you do if you miss a dose? °If you miss a dose, take it as soon as you remember on the same day then continue your regularly scheduled once daily regimen the next day. Do not take two doses of Xarelto® on the same day.  ° °Important Safety Information °A possible side effect of Xarelto® is bleeding. You should call your healthcare provider right away if   you  experience any of the following: °? Bleeding from an injury or your nose that does not stop. °? Unusual colored urine (red or dark brown) or unusual colored stools (red or black). °? Unusual bruising for unknown reasons. °? A serious fall or if you hit your head (even if there is no bleeding). ° °Some medicines may interact with Xarelto® and might increase your risk of bleeding while on Xarelto®. To help avoid this, consult your healthcare provider or pharmacist prior to using any new prescription or non-prescription medications, including herbals, vitamins, non-steroidal anti-inflammatory drugs (NSAIDs) and supplements. ° °This website has more information on Xarelto®: www.xarelto.com. ° ° ° °

## 2014-12-26 NOTE — Discharge Summary (Signed)
Physician Discharge Summary  Patient ID: David Cowan MRN: [id number] DOB/AGE: [birth year] 54 y.o.  Admit date: 12/10/2014 Discharge date: 12/11/2014   Procedures:  Procedure(s) (LRB): LEFT TOTAL HIP ARTHROPLASTY ANTERIOR APPROACH (Left)  Attending Physician:  Dr. Paralee Cancel   Admission Diagnoses:   Left hip primary OA / pain  Discharge Diagnoses:  Principal Problem:   S/P left THA, AA Active Problems:   Obese  Past Medical History  Diagnosis Date  . CAD (coronary artery disease)   . Hypertension   . Hyperlipidemia   . History of MI (myocardial infarction)   . Bronchitis   . Dyslipidemia   . Right flank pain   . Hand pain   . Myocardial infarction 2000  . Arthritis   . Hard of hearing     left ear    HPI:    David Cowan, 54 y.o. male, has a history of pain and functional disability in the left hip(s) due to arthritis and patient has failed non-surgical conservative treatments for greater than 12 weeks to include NSAID's and/or analgesics, corticosteriod injections and activity modification. Onset of symptoms was gradual starting 1+ years ago with gradually worsening course since that time.The patient noted no past surgery on the left hip(s). Patient currently rates pain in the left hip at 9 out of 10 with activity. Patient has night pain, worsening of pain with activity and weight bearing, trendelenberg gait, pain that interfers with activities of daily living and pain with passive range of motion. Patient has evidence of periarticular osteophytes and joint space narrowing by imaging studies. This condition presents safety issues increasing the risk of falls. There is no current active infection. Risks, benefits and expectations were discussed with the patient. Risks including but not limited to the risk of anesthesia, blood clots, nerve damage, blood vessel damage, failure of the prosthesis, infection and up to and including death. Patient understand the risks,  benefits and expectations and wishes to proceed with surgery.   PCP: Arnette Norris, MD   Discharged Condition: good  Hospital Course:  Patient underwent the above stated procedure on 12/10/2014. Patient tolerated the procedure well and brought to the recovery room in good condition and subsequently to the floor.  POD #1 BP: 112/59 ; Pulse: 50 ; Temp: 98 F (36.7 C) ; Resp: 16 Patient reports pain as mild, pain controlled. Having some muscle spasms. No events throughout the night. Ready to be discharged home. Dorsiflexion/plantar flexion intact, incision: dressing C/D/I, no cellulitis present and compartment soft.   LABS  Basename    HGB  11.8  HCT  37.1    Discharge Exam: General appearance: alert, cooperative and no distress Extremities: Homans sign is negative, no sign of DVT, no edema, redness or tenderness in the calves or thighs and no ulcers, gangrene or trophic changes  Disposition: Home with follow up in 2 weeks   Follow-up Information    Follow up with [name], MD. Schedule an appointment as soon as possible for a visit in 2 weeks.   Specialty:  Orthopedic Surgery   Contact information:   [address] [place] [address] [phone]       Follow [facility] with [facility].   Why:  home health physical therapy   Contact information:   [place]  [address] [phone]       Follow up with [facility].   Why:  rolling walker and 3n1  Contact information:   [address] [place] [address] [phone]       Discharge Instructions    Call MD / Call 911    Complete by:  As directed   If you experience chest pain or shortness of breath, CALL 911 and be transported to the hospital emergency room.  If you develope a fever above 101 F, pus (white drainage) or increased drainage or redness at the wound, or calf pain, call your surgeon's office.     Change dressing    Complete by:   As directed   Maintain surgical dressing until follow up in the clinic. If the edges start to pull up, may reinforce with tape. If the dressing is no longer working, may remove and cover with gauze and tape, but must keep the area dry and clean.  Call with any questions or concerns.     Constipation Prevention    Complete by:  As directed   Drink plenty of fluids.  Prune juice may be helpful.  You may use a stool softener, such as Colace (over the counter) 100 mg twice a day.  Use MiraLax (over the counter) for constipation as needed.     Diet - low sodium heart healthy    Complete by:  As directed      Discharge instructions    Complete by:  As directed   Maintain surgical dressing until follow up in the clinic. If the edges start to pull up, may reinforce with tape. If the dressing is no longer working, may remove and cover with gauze and tape, but must keep the area dry and clean.  Follow up in 2 weeks at [facility]. Call with any questions or concerns.     Increase activity slowly as tolerated    Complete by:  As directed      TED hose    Complete by:  As directed   Use stockings (TED hose) for 2 weeks on both leg(s).  You may remove them at night for sleeping.     Weight bearing as tolerated    Complete by:  As directed   Laterality:  left  Extremity:  Lower             Medication List    TAKE these medications        aspirin EC 325 MG tablet  Take 1 tablet (325 mg total) by mouth 2 (two) times daily. Take for 4 weeks.     docusate sodium 100 MG capsule  Commonly known as:  COLACE  Take 1 capsule (100 mg total) by mouth 2 (two) times daily.     enalapril 10 MG tablet  Commonly known as:  VASOTEC  Take one tablet by mouth twice a day.     fenofibrate 145 MG tablet  Commonly known as:  TRICOR  TAKE 1 TABLET DAILY     ferrous sulfate 325 (65 FE) MG tablet  Take 1 tablet (325 mg total) by mouth 3 (three) times daily after meals.     fluticasone 50 MCG/ACT  nasal spray  Commonly known as:  FLONASE  Place 2 sprays into both nostrils as needed for allergies or rhinitis.     folic acid 1 MG tablet  Commonly known as:  FOLVITE  TAKE 1 TABLET DAILY     HYDROcodone-acetaminophen 7.5-325 MG per tablet  Commonly known as:  NORCO  Take 1-2 tablets by mouth every 4 (four) hours as needed for moderate pain.  methocarbamol 500 MG tablet  Commonly known as:  ROBAXIN  Take 1 tablet (500 mg total) by mouth every 6 (six) hours as needed for muscle spasms.     metoprolol succinate 100 MG 24 hr tablet  Commonly known as:  TOPROL-XL  TAKE 1 TABLET AT BEDTIME     nitroGLYCERIN 0.4 MG SL tablet  Commonly known as:  NITROSTAT  Place 1 tablet (0.4 mg total) under the tongue every 5 (five) minutes as needed for chest pain.     polyethylene glycol packet  Commonly known as:  MIRALAX / GLYCOLAX  Take 17 g by mouth 2 (two) times daily.     simvastatin 40 MG tablet  Commonly known as:  ZOCOR  TAKE 1 TABLET AT BEDTIME     triamterene-hydrochlorothiazide 37.5-25 MG per tablet  Commonly known as:  MAXZIDE-25  TAKE 1 TABLET BY MOUTH DAILY         Signed: [place] Pugh. Shinichi Anguiano   PA-C  12/26/2014, 11:13 AM

## 2015-01-11 ENCOUNTER — Other Ambulatory Visit: Payer: Self-pay | Admitting: Internal Medicine

## 2015-02-11 ENCOUNTER — Other Ambulatory Visit: Payer: Self-pay | Admitting: Internal Medicine

## 2015-02-23 ENCOUNTER — Other Ambulatory Visit: Payer: Self-pay | Admitting: Internal Medicine

## 2015-02-28 ENCOUNTER — Other Ambulatory Visit: Payer: Self-pay | Admitting: Internal Medicine

## 2015-03-04 ENCOUNTER — Telehealth: Payer: Self-pay | Admitting: *Deleted

## 2015-03-04 NOTE — Telephone Encounter (Signed)
[facility] called for a refill of enalapril. Rx was denied on 02/24/15 as the patient needs an appointment and a note to send to pcp was put on the request. I made the representative aware of this and she stated that they called the patient and he claimed that Dr Lovena Le was his pcp. I made her aware that the patient would need an appointment for further refills to be granted. [facility] will notify the patient of this.

## 2015-03-14 ENCOUNTER — Other Ambulatory Visit: Payer: Self-pay | Admitting: Internal Medicine

## 2015-03-17 ENCOUNTER — Other Ambulatory Visit: Payer: Self-pay | Admitting: Internal Medicine

## 2015-03-18 ENCOUNTER — Other Ambulatory Visit: Payer: Self-pay | Admitting: *Deleted

## 2015-03-18 MED ORDER — ENALAPRIL MALEATE 10 MG PO TABS: ORAL_TABLET | ORAL | Status: DC

## 2015-04-29 ENCOUNTER — Encounter: Payer: Self-pay | Admitting: Internal Medicine

## 2015-04-29 ENCOUNTER — Ambulatory Visit (INDEPENDENT_AMBULATORY_CARE_PROVIDER_SITE_OTHER): Payer: 59 | Admitting: Internal Medicine

## 2015-04-29 ENCOUNTER — Other Ambulatory Visit: Payer: Self-pay

## 2015-04-29 VITALS — BP 140/76 | HR 99 | Ht 70.0 in | Wt 246.8 lb

## 2015-04-29 DIAGNOSIS — I2510 Atherosclerotic heart disease of native coronary artery without angina pectoris: Secondary | ICD-10-CM

## 2015-04-29 DIAGNOSIS — Z720 Tobacco use: Secondary | ICD-10-CM

## 2015-04-29 DIAGNOSIS — I10 Essential (primary) hypertension: Secondary | ICD-10-CM

## 2015-04-29 DIAGNOSIS — I252 Old myocardial infarction: Secondary | ICD-10-CM

## 2015-04-29 MED ORDER — NITROGLYCERIN 0.4 MG SL SUBL: 0.4000 mg | SUBLINGUAL_TABLET | SUBLINGUAL | Status: DC | PRN

## 2015-04-29 NOTE — Assessment & Plan Note (Signed)
I have strongly encouraged the patient to stop smoking. He will try.

## 2015-04-29 NOTE — Assessment & Plan Note (Signed)
His blood pressure remains elevated. He admits to dietary indiscretion. I have asked the patient to try and lose weight.

## 2015-04-29 NOTE — Progress Notes (Signed)
HPI [name] returns today for followup. He is a pleasant 54 yo man with CAD, s/p MI, HTN, ongoing tobacco abuse and dyslipidemia. In the interim, he has had no chest pain or sob. He has been unable to gain weight. His blood pressure is improved. The patient underwent hip replacement surgery several months ago and had no complications. Allergies  Allergen Reactions  . Amoxicillin Rash  . Penicillins Rash     Current Outpatient Prescriptions  Medication Sig Dispense Refill  . enalapril (VASOTEC) 10 MG tablet Take one tablet by mouth twice a day. 180 tablet 0  . fenofibrate (TRICOR) 145 MG tablet TAKE 1 TABLET DAILY 90 tablet 3  . fluticasone (FLONASE) 50 MCG/ACT nasal spray Place 2 sprays into both nostrils as needed for allergies or rhinitis. 16 g 3  . folic acid (FOLVITE) 1 MG tablet TAKE 1 TABLET DAILY 90 tablet 3  . metoprolol succinate (TOPROL-XL) 100 MG 24 hr tablet TAKE 1 TABLET AT BEDTIME 90 tablet 3  . nitroGLYCERIN (NITROSTAT) 0.4 MG SL tablet Place 1 tablet (0.4 mg total) under the tongue every 5 (five) minutes as needed for chest pain. 25 tablet 3  . simvastatin (ZOCOR) 40 MG tablet TAKE 1 TABLET AT BEDTIME 90 tablet 3  . triamterene-hydrochlorothiazide (MAXZIDE-25) 37.5-25 MG per tablet TAKE 1 TABLET BY MOUTH DAILY 7 tablet 0   No current facility-administered medications for this visit.     Past Medical History  Diagnosis Date  . CAD (coronary artery disease)   . Hypertension   . Hyperlipidemia   . History of MI (myocardial infarction)   . Bronchitis   . Dyslipidemia   . Right flank pain   . Hand pain   . Myocardial infarction (David Cowan) 2000  . Arthritis   . Hard of hearing     left ear    ROS:   All systems reviewed and negative except as noted in the HPI.   Past Surgical History  Procedure Laterality Date  . Vasectomy    . Cardiac catheterization    . Total hip arthroplasty Left 12/10/2014    Procedure: LEFT TOTAL HIP ARTHROPLASTY ANTERIOR  APPROACH;  Surgeon: Paralee Cancel, MD;  Location: [facility];  Service: Orthopedics;  Laterality: Left;     Family History  Problem Relation Age of Onset  . Coronary artery disease       fam hx, early  . Heart attack Father 63  . Heart attack  40    uncle  . Factor V Leiden deficiency Sister   . Colon cancer Neg Hx   . Congestive Heart Failure Mother 25  . Lung cancer Mother      Social History   Social History  . Marital Status: Married    Spouse Name: N/A  . Number of Children: 2  . Years of Education: N/A   Occupational History  . SERVICE MANAGER    Social History Main Topics  . Smoking status: Current Every Day Smoker -- 1.00 packs/day    Types: Cigarettes  . Smokeless tobacco: Never Used  . Alcohol Use: 0.0 oz/week    0 Standard drinks or equivalent per week     Comment: socailly  . Drug Use: Yes    Special: Marijuana     Comment: occ use  . Sexual Activity: Not on file   Other Topics Concern  . Not on file   Social History Narrative   2 daughters     BP 140/76  mmHg  Pulse 99  Ht 5\' 10"  (1.778 m)  Wt 246 lb 12.8 oz (111.948 kg)  BMI 35.41 kg/m2  SpO2 97%  Physical Exam:  Well appearing obese middle aged man, NAD HEENT: Unremarkable Neck:  No JVD, no thyromegally Back:  No CVA tenderness Lungs:  Clear with no wheezes HEART:  Regular rate rhythm, no murmurs, no rubs, no clicks Abd:  soft, positive bowel sounds, obese, no organomegally, no rebound, no guarding Ext:  2 plus pulses, no edema, no cyanosis, no clubbing Skin:  No rashes no nodules Neuro:  CN II through XII intact, motor grossly intact  EKG - nsr   Assess/Plan:

## 2015-04-29 NOTE — Patient Instructions (Addendum)
Medication Instructions:  Your physician recommends that you continue on your current medications as directed. Please refer to the Current Medication list given to you today.  Labwork: None ordered  Testing/Procedures: None ordered  Follow-Up: Your physician wants you to follow-up in: 1 year with [name].  You will receive a reminder letter in the mail two months in advance. If you don't receive a letter, please call our office to schedule the follow-up appointment.   Any Other Special Instructions Will Be Listed Below (If Applicable).  If you need a refill on your cardiac medications before your next appointment, please call your pharmacy.  Thank you for choosing [facility]!!

## 2015-04-29 NOTE — Assessment & Plan Note (Signed)
He will continue his statin therapy. He is encouraged to reduce his fat intake.

## 2015-04-29 NOTE — Assessment & Plan Note (Signed)
He has had no anginal symptoms since his last visit. He remains active. Will hold off on stress testing since his is asymptomatic.

## 2015-05-12 ENCOUNTER — Other Ambulatory Visit: Payer: Self-pay | Admitting: Internal Medicine

## 2015-05-13 ENCOUNTER — Other Ambulatory Visit: Payer: Self-pay | Admitting: *Deleted

## 2015-05-13 MED ORDER — TRIAMTERENE-HCTZ 37.5-25 MG PO TABS: 1.0000 | ORAL_TABLET | Freq: Every day | ORAL | Status: DC

## 2015-05-19 ENCOUNTER — Other Ambulatory Visit: Payer: Self-pay | Admitting: *Deleted

## 2015-05-19 MED ORDER — TRIAMTERENE-HCTZ 37.5-25 MG PO TABS: 1.0000 | ORAL_TABLET | Freq: Every day | ORAL | Status: DC

## 2015-05-21 ENCOUNTER — Other Ambulatory Visit: Payer: Self-pay | Admitting: Internal Medicine

## 2015-06-14 ENCOUNTER — Other Ambulatory Visit: Payer: Self-pay | Admitting: Internal Medicine

## 2015-06-18 ENCOUNTER — Other Ambulatory Visit: Payer: Self-pay | Admitting: Internal Medicine

## 2015-06-18 DIAGNOSIS — I10 Essential (primary) hypertension: Secondary | ICD-10-CM

## 2015-06-18 MED ORDER — METOPROLOL SUCCINATE ER 100 MG PO TB24: ORAL_TABLET | ORAL | Status: DC

## 2015-06-23 ENCOUNTER — Other Ambulatory Visit: Payer: Self-pay | Admitting: Internal Medicine

## 2015-06-23 DIAGNOSIS — I10 Essential (primary) hypertension: Secondary | ICD-10-CM

## 2015-06-23 MED ORDER — SIMVASTATIN 40 MG PO TABS: 40.0000 mg | ORAL_TABLET | Freq: Every day | ORAL | Status: DC

## 2015-06-23 MED ORDER — FENOFIBRATE 145 MG PO TABS: 145.0000 mg | ORAL_TABLET | Freq: Every day | ORAL | Status: DC

## 2015-06-23 MED ORDER — ENALAPRIL MALEATE 10 MG PO TABS: 10.0000 mg | ORAL_TABLET | Freq: Two times a day (BID) | ORAL | Status: DC

## 2015-06-23 MED ORDER — METOPROLOL SUCCINATE ER 100 MG PO TB24: ORAL_TABLET | ORAL | Status: DC

## 2015-06-23 MED ORDER — TRIAMTERENE-HCTZ 37.5-25 MG PO TABS: 1.0000 | ORAL_TABLET | Freq: Every day | ORAL | Status: DC

## 2015-06-23 MED ORDER — FOLIC ACID 1 MG PO TABS: 1.0000 mg | ORAL_TABLET | Freq: Every day | ORAL | Status: DC

## 2016-02-14 ENCOUNTER — Emergency Department (HOSPITAL_COMMUNITY)
Admission: EM | Admit: 2016-02-14 | Discharge: 2016-02-14 | Disposition: A | Discharge status: Home / Self Care | Payer: 59 | Attending: Emergency Medicine | Admitting: Emergency Medicine

## 2016-02-14 ENCOUNTER — Encounter (HOSPITAL_COMMUNITY): Payer: Self-pay

## 2016-02-14 ENCOUNTER — Emergency Department (HOSPITAL_COMMUNITY): Payer: 59

## 2016-02-14 DIAGNOSIS — Z79899 Other long term (current) drug therapy: Secondary | ICD-10-CM | POA: Diagnosis not present

## 2016-02-14 DIAGNOSIS — F17210 Nicotine dependence, cigarettes, uncomplicated: Secondary | ICD-10-CM | POA: Insufficient documentation

## 2016-02-14 DIAGNOSIS — Z955 Presence of coronary angioplasty implant and graft: Secondary | ICD-10-CM | POA: Insufficient documentation

## 2016-02-14 DIAGNOSIS — I252 Old myocardial infarction: Secondary | ICD-10-CM | POA: Diagnosis not present

## 2016-02-14 DIAGNOSIS — M542 Cervicalgia: Secondary | ICD-10-CM | POA: Diagnosis not present

## 2016-02-14 DIAGNOSIS — I2510 Atherosclerotic heart disease of native coronary artery without angina pectoris: Secondary | ICD-10-CM | POA: Diagnosis not present

## 2016-02-14 DIAGNOSIS — Z96642 Presence of left artificial hip joint: Secondary | ICD-10-CM | POA: Diagnosis not present

## 2016-02-14 DIAGNOSIS — R51 Headache: Secondary | ICD-10-CM | POA: Insufficient documentation

## 2016-02-14 DIAGNOSIS — B349 Viral infection, unspecified: Secondary | ICD-10-CM | POA: Diagnosis not present

## 2016-02-14 DIAGNOSIS — I10 Essential (primary) hypertension: Secondary | ICD-10-CM | POA: Diagnosis not present

## 2016-02-14 DIAGNOSIS — R509 Fever, unspecified: Secondary | ICD-10-CM | POA: Diagnosis present

## 2016-02-14 LAB — URINALYSIS, ROUTINE W REFLEX MICROSCOPIC
BILIRUBIN URINE: NEGATIVE
Glucose, UA: NEGATIVE mg/dL
Ketones, ur: NEGATIVE mg/dL
LEUKOCYTES UA: NEGATIVE
NITRITE: NEGATIVE
PH: 6.5 (ref 5.0–8.0)
Protein, ur: 30 mg/dL — AB
SPECIFIC GRAVITY, URINE: 1.009 (ref 1.005–1.030)

## 2016-02-14 LAB — CBC WITH DIFFERENTIAL/PLATELET
BASOS ABS: 0 10*3/uL (ref 0.0–0.1)
BASOS PCT: 1 %
Eosinophils Absolute: 0 10*3/uL (ref 0.0–0.7)
Eosinophils Relative: 0 %
HEMATOCRIT: 46.9 % (ref 39.0–52.0)
Hemoglobin: 15 g/dL (ref 13.0–17.0)
Lymphocytes Relative: 17 %
Lymphs Abs: 1.3 10*3/uL (ref 0.7–4.0)
MCH: 30 pg (ref 26.0–34.0)
MCHC: 32 g/dL (ref 30.0–36.0)
MCV: 93.8 fL (ref 78.0–100.0)
MONO ABS: 0.7 10*3/uL (ref 0.1–1.0)
Monocytes Relative: 9 %
NEUTROS ABS: 5.7 10*3/uL (ref 1.7–7.7)
NEUTROS PCT: 73 %
Platelets: 187 10*3/uL (ref 150–400)
RBC: 5 MIL/uL (ref 4.22–5.81)
RDW: 14.4 % (ref 11.5–15.5)
WBC: 7.7 10*3/uL (ref 4.0–10.5)

## 2016-02-14 LAB — COMPREHENSIVE METABOLIC PANEL
ALK PHOS: 33 U/L — AB (ref 38–126)
ALT: 26 U/L (ref 17–63)
ANION GAP: 10 (ref 5–15)
AST: 38 U/L (ref 15–41)
Albumin: 3.5 g/dL (ref 3.5–5.0)
BILIRUBIN TOTAL: 0.5 mg/dL (ref 0.3–1.2)
BUN: 21 mg/dL — ABNORMAL HIGH (ref 6–20)
CALCIUM: 9.1 mg/dL (ref 8.9–10.3)
CO2: 22 mmol/L (ref 22–32)
Chloride: 102 mmol/L (ref 101–111)
Creatinine, Ser: 1.81 mg/dL — ABNORMAL HIGH (ref 0.61–1.24)
GFR calc non Af Amer: 40 mL/min — ABNORMAL LOW (ref >60)
GFR, EST AFRICAN AMERICAN: 47 mL/min — AB (ref >60)
GLUCOSE: 114 mg/dL — AB (ref 65–99)
Potassium: 4.3 mmol/L (ref 3.5–5.1)
Sodium: 134 mmol/L — ABNORMAL LOW (ref 135–145)
TOTAL PROTEIN: 6.4 g/dL — AB (ref 6.5–8.1)

## 2016-02-14 LAB — URINE MICROSCOPIC-ADD ON

## 2016-02-14 LAB — I-STAT CG4 LACTIC ACID, ED: Lactic Acid, Venous: 1.99 mmol/L (ref 0.5–1.9)

## 2016-02-14 LAB — SEDIMENTATION RATE: SED RATE: 12 mm/h (ref 0–16)

## 2016-02-14 MED ORDER — SODIUM CHLORIDE 0.9 % IV BOLUS (SEPSIS)
1000.0000 mL | Freq: Once | INTRAVENOUS | Status: AC
  Administered 2016-02-14: 1000 mL via INTRAVENOUS

## 2016-02-14 MED ORDER — ACETAMINOPHEN 325 MG PO TABS: 650.0000 mg | ORAL_TABLET | Freq: Once | ORAL | Status: DC

## 2016-02-14 MED ORDER — SODIUM CHLORIDE 0.9 % IV BOLUS (SEPSIS): 1000.0000 mL | Freq: Once | INTRAVENOUS | Status: DC

## 2016-02-14 MED ORDER — SODIUM CHLORIDE 0.9 % IV BOLUS (SEPSIS): 500.0000 mL | Freq: Once | INTRAVENOUS | Status: DC

## 2016-02-14 NOTE — ED Notes (Signed)
Spoke with Gertie Fey PA regarding patient vitals and complaint.

## 2016-02-14 NOTE — Discharge Instructions (Signed)
Please take tylenol or ibuprofen as needed for fever and pain.  Followup with your doctor for further care.  Stay hydrated by drinking adequate amount of water daily.  Return to the ER if your condition worsen or if you have other concerns.

## 2016-02-14 NOTE — ED Notes (Signed)
Asked Gertie Fey PA if he will being ordering antibiotics on pt. States will hold until lactic acid results.

## 2016-02-14 NOTE — ED Provider Notes (Signed)
[facility] DEPT Provider Note   CSN: [id number] Arrival date & time: 02/14/16  1007     History   Chief Complaint Chief Complaint  Patient presents with  . Fever  . Neck Pain    HPI David Cowan is a 55 y.o. male.  HPI   55 year old male with history of MI, CAD, bronchitis and arthritis presents with complaints of fever and neck pain. Patient reports gradual onset of neck stiffness, posterior headache ongoing for the past 2-3 days. He developed fever last night. MAXIMUM TEMPERATURE 100.5 and did take 2 Tylenol today prior to come into the ED. He also endorsed feeling weak, fatigued, diaphoretic, and sleeps more than usual since yesterday. Neck stiffness is worse in the morning and improves throughout the day. There is no associated runny nose sneezing coughing, chest pain shortness of breath or productive cough, sore throat or ear pain, dysuria, hematuria, and no new rash. Denies light and sound sensitivity. Report remote left total hip replacement performed last year by Dr. Alvan Dame last year. 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 Denies any significant hip pain at this time. No bowel bladder incontinence or saddle anesthesia.  Past Medical History:  Diagnosis Date  . Arthritis   . Bronchitis   . CAD (coronary artery disease)   . Dyslipidemia   . Hand pain   . Hard of hearing    left ear  . History of MI (myocardial infarction)   . Hyperlipidemia   . Hypertension   . Myocardial infarction ([place]) 2000  . Right flank pain     Patient Active Problem List   Diagnosis Date Noted  . Obese 12/11/2014  . S/P left THA, AA 12/10/2014  . Left hip pain 08/27/2014  . Allergic rhinitis 03/27/2014  . Health care maintenance 03/27/2014  . OA (osteoarthritis) of hip 03/27/2014  . Tobacco abuse 12/19/2013  . Bronchitis 12/01/2010  . MYOCARDIAL INFARCTION, HX OF 07/10/2010  .  HYPERGLYCEMIA 07/10/2010  . DYSLIPIDEMIA 03/13/2009  . Essential hypertension 03/13/2009  . CAD 03/13/2009    Past Surgical History:  Procedure Laterality Date  . CARDIAC CATHETERIZATION    . TOTAL HIP ARTHROPLASTY Left 12/10/2014   Procedure: LEFT TOTAL HIP ARTHROPLASTY ANTERIOR APPROACH;  Surgeon: Paralee Cancel, MD;  Location: [facility];  Service: Orthopedics;  Laterality: Left;  Marland Kitchen VASECTOMY         Home Medications    Prior to Admission medications   Medication Sig Start Date End Date Taking? Authorizing Provider  enalapril (VASOTEC) 10 MG tablet Take 1 tablet (10 mg total) by mouth 2 (two) times daily. 06/23/15   Evans Lance, MD  fenofibrate (TRICOR) 145 MG tablet Take 1 tablet (145 mg total) by mouth daily. 06/23/15   Evans Lance, MD  fluticasone (FLONASE) 50 MCG/ACT nasal spray Place 2 sprays into both nostrils as needed for allergies or rhinitis. 03/27/14   [name], MD  folic acid (FOLVITE) 1 MG tablet Take 1 tablet (1 mg total) by mouth daily. 06/23/15   Evans Lance, MD  metoprolol succinate (TOPROL-XL) 100 MG 24 hr tablet TAKE 1 TABLET AT BEDTIME 06/23/15   Evans Lance, MD  nitroGLYCERIN (NITROSTAT) 0.4 MG SL tablet Place 1 tablet (0.4 mg total) under the tongue every 5 (five) minutes as needed for chest pain. 04/29/15   Evans Lance, MD  simvastatin (  ZOCOR) 40 MG tablet Take 1 tablet (40 mg total) by mouth at bedtime. 06/23/15   Evans Lance, MD  triamterene-hydrochlorothiazide (MAXZIDE-25) 37.5-25 MG tablet Take 1 tablet by mouth daily. 06/23/15   Evans Lance, MD    Family History Family History  Problem Relation Age of Onset  . Heart attack Father 43  . Coronary artery disease       fam hx, early  . Heart attack  40    uncle  . Factor V Leiden deficiency Sister   . Congestive Heart Failure Mother 3  . Lung cancer Mother   . Colon cancer Neg Hx     Social History Social History  Substance Use Topics  . Smoking status: Current Every Day Smoker     Packs/day: 1.00    Types: Cigarettes  . Smokeless tobacco: Never Used  . Alcohol use 0.0 oz/week     Comment: socailly     Allergies   Amoxicillin and Penicillins   Review of Systems Review of Systems  All other systems reviewed and are negative.    Physical Exam Updated Vital Signs There were no vitals taken for this visit.  Physical Exam  Constitutional: He is oriented to person, place, and time. He appears well-developed and well-nourished. No distress.  Well-appearing male, nontoxic in appearance  HENT:  Head: Normocephalic and atraumatic.  Right Ear: External ear normal.  Left Ear: External ear normal.  Nose: Nose normal.  Mouth/Throat: Oropharynx is clear and moist.  Eyes: Conjunctivae and EOM are normal. Pupils are equal, round, and reactive to light.  Neck: Normal range of motion. Neck supple.  No nuchal rigidity, no meningismal sign  Cardiovascular: Normal rate and regular rhythm.   Pulmonary/Chest: Effort normal and breath sounds normal. He has no wheezes.  Abdominal: Soft. Bowel sounds are normal. There is no tenderness.  Musculoskeletal: Normal range of motion.  On inspection of left hip, patient has normal range of motion, no reproducible pain and no significant overlying skin changes.  Neurological: He is alert and oriented to person, place, and time. He has normal strength. No cranial nerve deficit or sensory deficit. GCS eye subscore is 4. GCS verbal subscore is 5. GCS motor subscore is 6.  Skin: No rash noted.  Psychiatric: He has a normal mood and affect.  Nursing note and vitals reviewed.    ED Treatments / Results  Labs (all labs ordered are listed, but only abnormal results are displayed) Labs Reviewed  COMPREHENSIVE METABOLIC PANEL - Abnormal; Notable for the following:       Result Value   Sodium 134 (*)    Glucose, Bld 114 (*)    BUN 21 (*)    Creatinine, Ser 1.81 (*)    Total Protein 6.4 (*)    Alkaline Phosphatase 33 (*)    GFR calc  non Af Amer 40 (*)    GFR calc Af Amer 47 (*)    All other components within normal limits  I-STAT CG4 LACTIC ACID, ED - Abnormal; Notable for the following:    Lactic Acid, Venous 1.99 (*)    All other components within normal limits  CULTURE, BLOOD (ROUTINE X 2)  CULTURE, BLOOD (ROUTINE X 2)  URINE CULTURE  CBC WITH DIFFERENTIAL/PLATELET  SEDIMENTATION RATE  URINALYSIS, ROUTINE W REFLEX MICROSCOPIC (NOT AT [facility])  I-STAT CG4 LACTIC ACID, ED    EKG  EKG Interpretation None       Radiology Ct Head Wo Contrast  Result Date: 02/14/2016  CLINICAL DATA:  Patient has had neck pain for 2-3 days with a fever and generalized weakness. Headache EXAM: CT HEAD WITHOUT CONTRAST TECHNIQUE: Contiguous axial images were obtained from the base of the skull through the vertex without intravenous contrast. COMPARISON:  None. FINDINGS: Brain: There is no intra or extra-axial fluid collection or mass lesion. The basilar cisterns and ventricles have a normal appearance. There is no CT evidence for acute infarction or hemorrhage. Vascular: Unremarkable. Skull: Unremarkable. Sinuses/Orbits: Normally aerated mastoids and paranasal sinuses. Other: None IMPRESSION: No evidence for acute intracranial abnormality. Electronically Signed   By: Nolon Nations M.D.   On: 02/14/2016 11:59   Dg Chest Port 1 View  Result Date: 02/14/2016 CLINICAL DATA:  Neck and chest pain and fever. EXAM: PORTABLE CHEST 1 VIEW COMPARISON:  06/28/2010 FINDINGS: The cardiac silhouette, mediastinal and hilar contours are within normal limits and stable. The lungs are clear. No pleural effusion. The bony thorax is intact. IMPRESSION: No acute cardiopulmonary findings. Electronically Signed   By: Marijo Sanes M.D.   On: 02/14/2016 11:11    Procedures Procedures (including critical care time)  Medications Ordered in ED Medications - No data to display   Initial Impression / Assessment and Plan / ED Course  I have reviewed the triage  vital signs and the nursing notes.  Pertinent labs & imaging results that were available during my care of the patient were reviewed by me and considered in my medical decision making (see chart for details).  Clinical Course    BP 123/70   Pulse 68   Temp 98.1 F (36.7 C) (Oral)   Resp 23   Ht 5\' 10"  (1.778 m)   Wt 112.7 kg   SpO2 96%   BMI 35.65 kg/m    Final Clinical Impressions(s) / ED Diagnoses   Final diagnoses:  Viral illness    New Prescriptions New Prescriptions   No medications on file   10:42 AM Patient here with fever, headache and neck pain. He report having neck stiffness however he has no nuchal rigidity on meningismal sign on exam. Currently afebrile however patient did take Tylenol prior to arrival. Blood pressure is soft as 92/63. Patient has history of hypertension and currently on hydrochlorothiazide. I'm concerned for potential infection. Sepsis initiated.  Care discussed with Dr. Zenia Resides.     12:05 PM Dr. Zenia Resides evaluated pt and felt this is likely viral.  He does not suggest performing an LP as pt does not have any nuchal rigidity.  Pt agrees.  Code Sepsis cancelled.  NO abx given.  IVF given though as pt has mildly elevated lactic acid of 1.99, but normal WBC.    1:41 PM Patient is resting comfortably. Blood pressure improves with IV fluid. Labs suggest mild dehydration initially which I think IV fluid did help. He has no nuchal rigidity on reexamination. His head CT scan is unremarkable and his chest x-ray is normal. No leukocytosis. I felt the patient is stable for discharge. Patient understands to return if condition worsen. Recommend staying hydrated.   Domenic Moras, PA-C 02/14/16 1442    Lacretia Leigh, MD 02/16/16 [phone]

## 2016-02-14 NOTE — ED Triage Notes (Addendum)
Patient complains of posterior neck pain, fever, and increased pain when flexing neck x 2 days. Worse neck pain and headache in the mornings. Also complains of left hip pain, denies trauma. Had replacement in 2016. Had fever this am 100.7 and had tylenol 2 extra strength 1 hour pta. Alert and oriented. No nausea, no blurred vision. . Chills last night with same. BP in 90's on arrival.

## 2016-02-14 NOTE — ED Provider Notes (Signed)
Medical screening examination/treatment/procedure(s) were conducted as a shared visit with non-physician practitioner(s) and myself.  I personally evaluated the patient during the encounter.   EKG Interpretation None     Patient here with 2-3 history of fever or myalgias some neck stiffness lateralized to his lateral paraspinal regions without rashes, photophobia, vomiting. Has had some myalgias. Treated himself at home with over-the-counter medications. On exam here he has no nuchal rigidity. Suspect possible viral illness. Labs are pending   Lacretia Leigh, MD 02/14/16 1128

## 2016-02-14 NOTE — ED Notes (Signed)
Patient transported to CT 

## 2016-02-15 LAB — URINE CULTURE: CULTURE: NO GROWTH

## 2016-02-19 ENCOUNTER — Ambulatory Visit (INDEPENDENT_AMBULATORY_CARE_PROVIDER_SITE_OTHER): Payer: 59 | Admitting: Family Medicine

## 2016-02-19 ENCOUNTER — Encounter: Payer: Self-pay | Admitting: Family Medicine

## 2016-02-19 DIAGNOSIS — N179 Acute kidney failure, unspecified: Secondary | ICD-10-CM | POA: Diagnosis not present

## 2016-02-19 DIAGNOSIS — I959 Hypotension, unspecified: Secondary | ICD-10-CM | POA: Insufficient documentation

## 2016-02-19 DIAGNOSIS — N63 Unspecified lump in breast: Secondary | ICD-10-CM

## 2016-02-19 DIAGNOSIS — B349 Viral infection, unspecified: Secondary | ICD-10-CM | POA: Insufficient documentation

## 2016-02-19 DIAGNOSIS — N6310 Unspecified lump in the right breast, unspecified quadrant: Secondary | ICD-10-CM | POA: Insufficient documentation

## 2016-02-19 DIAGNOSIS — I9589 Other hypotension: Secondary | ICD-10-CM | POA: Diagnosis not present

## 2016-02-19 LAB — CULTURE, BLOOD (ROUTINE X 2)
CULTURE: NO GROWTH
Culture: NO GROWTH

## 2016-02-19 LAB — BASIC METABOLIC PANEL
BUN: 36 mg/dL — ABNORMAL HIGH (ref 6–23)
CHLORIDE: 99 meq/L (ref 96–112)
CO2: 29 mEq/L (ref 19–32)
CREATININE: 1.92 mg/dL — AB (ref 0.40–1.50)
Calcium: 9.9 mg/dL (ref 8.4–10.5)
GFR: 38.79 mL/min — ABNORMAL LOW (ref >60.00)
Glucose, Bld: 99 mg/dL (ref 70–99)
POTASSIUM: 4.5 meq/L (ref 3.5–5.1)
SODIUM: 135 meq/L (ref 135–145)

## 2016-02-19 LAB — CBC WITH DIFFERENTIAL/PLATELET
BASOS PCT: 0.7 % (ref 0.0–3.0)
Basophils Absolute: 0.1 10*3/uL (ref 0.0–0.1)
EOS ABS: 0.3 10*3/uL (ref 0.0–0.7)
EOS PCT: 2.2 % (ref 0.0–5.0)
HEMATOCRIT: 43.1 % (ref 39.0–52.0)
HEMOGLOBIN: 14.6 g/dL (ref 13.0–17.0)
LYMPHS PCT: 32.4 % (ref 12.0–46.0)
Lymphs Abs: 3.9 10*3/uL (ref 0.7–4.0)
MCHC: 33.9 g/dL (ref 30.0–36.0)
MCV: 89 fl (ref 78.0–100.0)
MONOS PCT: 7.3 % (ref 3.0–12.0)
Monocytes Absolute: 0.9 10*3/uL (ref 0.1–1.0)
Neutro Abs: 7 10*3/uL (ref 1.4–7.7)
Neutrophils Relative %: 57.4 % (ref 43.0–77.0)
Platelets: 260 10*3/uL (ref 150.0–400.0)
RBC: 4.85 Mil/uL (ref 4.22–5.81)
RDW: 14.8 % (ref 11.5–15.5)
WBC: 12.2 10*3/uL — AB (ref 4.0–10.5)

## 2016-02-19 MED ORDER — ENALAPRIL MALEATE 10 MG PO TABS: 10.0000 mg | ORAL_TABLET | Freq: Every day | ORAL | 2 refills | Status: DC

## 2016-02-19 NOTE — Assessment & Plan Note (Signed)
Likely pre renal/dehydration. Repeat BMET today, push fluids.

## 2016-02-19 NOTE — Progress Notes (Signed)
Subjective:   Patient ID: David Cowan, male    DOB: 1960/09/04, 55 y.o.   MRN: [id number]  David Cowan is a pleasant 55 y.o. year old male who presents to clinic today with Hospitalization Follow-up  on 02/19/2016  HPI:  Was seen in ER on 02/14/16.  Notes reviewed.  Presented with fever, myalgias and neck stiffness for 3 days duration.  Tmax was 100.5 No nuchal rigidity on exam.  Was hypotensive in ER- initial concern for sepsis. BP Readings from Last 3 Encounters:  02/19/16 108/70  02/14/16 137/78  04/29/15 140/76     Head CT neg. Given IV fluids and told likely viral and advised supportive care.  Cr was elevated.  Feels better now.  He has noticed a right breast mass, just above his nipple recently.  Not painful.  No nipple discharge.  Lab Results  Component Value Date   WBC 7.7 02/14/2016   HGB 15.0 02/14/2016   HCT 46.9 02/14/2016   MCV 93.8 02/14/2016   PLT 187 02/14/2016   Lab Results  Component Value Date   NA 134 (L) 02/14/2016   K 4.3 02/14/2016   CL 102 02/14/2016   CO2 22 02/14/2016   Lab Results  Component Value Date   CREATININE 1.81 (H) 02/14/2016   Lab Results  Component Value Date   ALT 26 02/14/2016   AST 38 02/14/2016   ALKPHOS 33 (L) 02/14/2016   BILITOT 0.5 02/14/2016     Ct Head Wo Contrast  Result Date: 02/14/2016 CLINICAL DATA:  Patient has had neck pain for 2-3 days with a fever and generalized weakness. Headache EXAM: CT HEAD WITHOUT CONTRAST TECHNIQUE: Contiguous axial images were obtained from the base of the skull through the vertex without intravenous contrast. COMPARISON:  None. FINDINGS: Brain: There is no intra or extra-axial fluid collection or mass lesion. The basilar cisterns and ventricles have a normal appearance. There is no CT evidence for acute infarction or hemorrhage. Vascular: Unremarkable. Skull: Unremarkable. Sinuses/Orbits: Normally aerated mastoids and paranasal sinuses. Other: None IMPRESSION: No evidence for  acute intracranial abnormality. Electronically Signed   By: Nolon Nations M.D.   On: 02/14/2016 11:59   Dg Chest Port 1 View  Result Date: 02/14/2016 CLINICAL DATA:  Neck and chest pain and fever. EXAM: PORTABLE CHEST 1 VIEW COMPARISON:  06/28/2010 FINDINGS: The cardiac silhouette, mediastinal and hilar contours are within normal limits and stable. The lungs are clear. No pleural effusion. The bony thorax is intact. IMPRESSION: No acute cardiopulmonary findings. Electronically Signed   By: Marijo Sanes M.D.   On: 02/14/2016 11:11    Current Outpatient Prescriptions on File Prior to Visit  Medication Sig Dispense Refill  . acetaminophen (TYLENOL) 500 MG tablet Take 1,000 mg by mouth every 6 (six) hours as needed for mild pain or moderate pain.    Marland Kitchen aspirin 325 MG tablet Take 325 mg by mouth daily.    . cetirizine (ZYRTEC) 10 MG tablet Take 10 mg by mouth daily as needed for allergies.    . fenofibrate (TRICOR) 145 MG tablet Take 1 tablet (145 mg total) by mouth daily. 90 tablet 3  . fluticasone (FLONASE) 50 MCG/ACT nasal spray Place 2 sprays into both nostrils as needed for allergies or rhinitis. 16 g 3  . folic acid (FOLVITE) 1 MG tablet Take 1 tablet (1 mg total) by mouth daily. 90 tablet 3  . metoprolol succinate (TOPROL-XL) 100 MG 24 hr tablet TAKE 1 TABLET AT BEDTIME 90  tablet 2  . nitroGLYCERIN (NITROSTAT) 0.4 MG SL tablet Place 1 tablet (0.4 mg total) under the tongue every 5 (five) minutes as needed for chest pain. 25 tablet 3  . simvastatin (ZOCOR) 40 MG tablet Take 1 tablet (40 mg total) by mouth at bedtime. 90 tablet 3  . triamterene-hydrochlorothiazide (MAXZIDE-25) 37.5-25 MG tablet Take 1 tablet by mouth daily. 90 tablet 3   No current facility-administered medications on file prior to visit.     Allergies  Allergen Reactions  . Amoxicillin Rash  . Penicillins Rash    Past Medical History:  Diagnosis Date  . Arthritis   . Bronchitis   . CAD (coronary artery disease)     . Dyslipidemia   . Hand pain   . Hard of hearing    left ear  . History of MI (myocardial infarction)   . Hyperlipidemia   . Hypertension   . Myocardial infarction ([place]) 2000  . Right flank pain     Past Surgical History:  Procedure Laterality Date  . CARDIAC CATHETERIZATION    . 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 Heart attack Father 35  . Coronary artery disease       fam hx, early  . Heart attack  40    uncle  . Factor V Leiden deficiency Sister   . Congestive Heart Failure Mother 57  . Lung cancer Mother   . Colon cancer Neg Hx     Social History   Social History  . Marital status: Married    Spouse name: N/A  . Number of children: 2  . Years of education: N/A   Occupational History  . SERVICE Adult nurse Treatment   Social History Main Topics  . Smoking status: Current Every Day Smoker    Packs/day: 1.00    Types: Cigarettes  . Smokeless tobacco: Never Used  . Alcohol use 0.0 oz/week     Comment: socailly  . Drug use:     Types: Marijuana     Comment: occ use  . Sexual activity: Not on file   Other Topics Concern  . Not on file   Social History Narrative   2 daughters   The PMH, PSH, Social History, Family History, Medications, and allergies have been reviewed in [facility], and have been updated if relevant.   Review of Systems  Constitutional: Negative.   HENT: Negative.   Respiratory: Negative.   Gastrointestinal: Negative.   Musculoskeletal: Negative.   Neurological: Negative.   Hematological: Negative.   Psychiatric/Behavioral: Negative.   All other systems reviewed and are negative.      Objective:    BP 108/70   Pulse 90   Temp 98 F (36.7 C) (Oral)   Wt 246 lb 4 oz (111.7 kg)   SpO2 96%   BMI 35.33  kg/m    Physical Exam  Constitutional: He is oriented to person, place, and time. He appears well-developed and well-nourished. No distress.  HENT:  Head: Normocephalic.  Eyes: Conjunctivae are normal.  Cardiovascular: Regular rhythm.   Pulmonary/Chest: Effort normal and breath sounds normal. Right breast exhibits mass. Right breast exhibits no inverted nipple, no nipple discharge, no skin change and no tenderness. Left breast exhibits no inverted nipple, no mass, no nipple  discharge, no skin change and no tenderness.    Musculoskeletal: Normal range of motion.  Neurological: He is alert and oriented to person, place, and time. No cranial nerve deficit.  Skin: Skin is warm and dry. He is not diaphoretic.  Psychiatric: He has a normal mood and affect. His behavior is normal. Judgment and thought content normal.  Nursing note and vitals reviewed.         Assessment & Plan:   Viral illness - Plan: Basic metabolic panel, CBC with Differential/Platelet  Acute renal failure, unspecified acute renal failure type ([place]) - Plan: Basic metabolic panel  Other specified hypotension  Breast mass, right - Plan: MM Digital Diagnostic Unilat R No Follow-up on file.

## 2016-02-19 NOTE — Assessment & Plan Note (Signed)
BP remains low today. Will decrease enalapril to 10 mg daily. Follow up in 2 weeks. The patient indicates understanding of these issues and agrees with the plan.

## 2016-02-19 NOTE — Progress Notes (Signed)
Pre visit review using our clinic review tool, if applicable. No additional management support is needed unless otherwise documented below in the visit note. 

## 2016-02-19 NOTE — Patient Instructions (Signed)
Great to see you. We are decreasing your Vasotec to 10 mg daily.  Check your blood pressure at home or come see me in 2 weeks.

## 2016-02-19 NOTE — Assessment & Plan Note (Signed)
New- breast imaging for further evaluation.

## 2016-02-19 NOTE — Assessment & Plan Note (Signed)
Resolving.  No further symptoms. CBC today for reassurance.

## 2016-02-20 ENCOUNTER — Telehealth: Payer: Self-pay | Admitting: Family Medicine

## 2016-02-20 DIAGNOSIS — N6310 Unspecified lump in the right breast, unspecified quadrant: Secondary | ICD-10-CM

## 2016-02-20 NOTE — Telephone Encounter (Signed)
Spoke to breast center They need mammogram order changed to img [id number] And also need to add ultra sound img [id number] thanks

## 2016-02-20 NOTE — Telephone Encounter (Signed)
Orders entered

## 2016-03-10 ENCOUNTER — Ambulatory Visit
Admission: RE | Admit: 2016-03-10 | Discharge: 2016-03-10 | Disposition: A | Discharge status: Home / Self Care | Payer: 59 | Source: Ambulatory Visit | Attending: Family Medicine | Admitting: Family Medicine

## 2016-03-10 DIAGNOSIS — N6310 Unspecified lump in the right breast, unspecified quadrant: Secondary | ICD-10-CM

## 2016-03-13 ENCOUNTER — Other Ambulatory Visit: Payer: Self-pay | Admitting: Internal Medicine

## 2016-03-13 DIAGNOSIS — I10 Essential (primary) hypertension: Secondary | ICD-10-CM

## 2016-04-06 ENCOUNTER — Other Ambulatory Visit: Payer: Self-pay | Admitting: Internal Medicine

## 2016-04-17 ENCOUNTER — Other Ambulatory Visit: Payer: Self-pay | Admitting: Internal Medicine

## 2016-04-17 DIAGNOSIS — I10 Essential (primary) hypertension: Secondary | ICD-10-CM

## 2016-05-05 ENCOUNTER — Encounter: Payer: Self-pay | Admitting: Cardiology

## 2016-05-16 NOTE — Progress Notes (Signed)
Cardiology Office Note   Date:  05/17/2016   ID:  David Cowan 12-05-1960, MRN [id number]  PCP:  David Norris, MD  Cardiologist:  David Cowan    Chief Complaint  Patient presents with  . Coronary Artery Disease    and HTN       History of Present Illness: David Cowan is a 55 y.o. male who presents for eval for his hx. Of CAD, s/p MI- treated medically, HTN, ongoing tobacco abuse and dyslipidemia. In the interim.  Last cath 2003 with Coronaries: The left main had 25% ostial stenosis.  The LAD had long mid    25% stenosis.  The first diagonal was small.  The second diagonal had been    a large vessel with high grade disease in a subbranch.  It is now    occluded at the ostium.  The circumflex in the AV groove had luminal    irregularities.  A large obtuse marginal had a mid 50% stenosis.  This did    not appear to be higher grade than this even after infusion of    intercoronary nitroglycerin.  The right coronary artery was nondominant.    It had a long, mid 70% stenosis.  There was a small RV branch with 99%    proximal stenosis.  LEFT VENTRICULOGRAM:  The left ventriculogram was obtained in the RAO projection.  The EF was 50% with mild anterior, mid, and lateral hypokinesis.   Today no chest pain and no SOB.  If he walks far develops back pain.  We discussed his tobacco use and he is "thinking of stopping but not there yet."  Recent  VIral infection with dehydration and hypotension.  Enalapril decreased to once daily.  Today BP up some, he will check at home and if continues elevated we will increase enalapril.   Past Medical History:  Diagnosis Date  . Arthritis   . Bronchitis   . CAD (coronary artery disease)   . Dyslipidemia   . Hand pain   . Hard of hearing    left ear  . History of MI (myocardial infarction)   . Hyperlipidemia   . Hypertension   . Myocardial infarction 2000  . Right flank pain     Past Surgical History:  Procedure Laterality Date    . CARDIAC CATHETERIZATION    . TOTAL HIP ARTHROPLASTY Left 12/10/2014   Procedure: LEFT TOTAL HIP ARTHROPLASTY ANTERIOR APPROACH;  Surgeon: David Cancel, MD;  Location: [facility];  Service: Orthopedics;  Laterality: Left;  Marland Kitchen VASECTOMY       Current Outpatient Prescriptions  Medication Sig Dispense Refill  . acetaminophen (TYLENOL) 500 MG tablet Take 1,000 mg by mouth every 6 (six) hours as needed for mild pain or moderate pain.    Marland Kitchen aspirin 325 MG tablet Take 325 mg by mouth daily.    . cetirizine (ZYRTEC) 10 MG tablet Take 10 mg by mouth daily as needed for allergies.    Marland Kitchen enalapril (VASOTEC) 10 MG tablet Take 10 mg by mouth daily.    . fenofibrate (TRICOR) 145 MG tablet Take 1 tablet (145 mg total) by mouth daily. 90 tablet 3  . fluticasone (FLONASE) 50 MCG/ACT nasal spray Place 2 sprays into both nostrils as needed for allergies or rhinitis. 16 g 3  . folic acid (FOLVITE) 1 MG tablet Take 1 tablet (1 mg total) by mouth daily. 90 tablet 3  . metoprolol succinate (TOPROL-XL) 100 MG 24 hr tablet TAKE  1 TABLET BY MOUTH AT  BEDTIME 15 tablet 0  . nitroGLYCERIN (NITROSTAT) 0.4 MG SL tablet Place 1 tablet (0.4 mg total) under the tongue every 5 (five) minutes as needed for chest pain. 25 tablet 3  . simvastatin (ZOCOR) 40 MG tablet Take 1 tablet (40 mg total) by mouth at bedtime. 90 tablet 3  . triamterene-hydrochlorothiazide (MAXZIDE-25) 37.5-25 MG tablet Take 1 tablet by mouth daily. 90 tablet 3   No current facility-administered medications for this visit.     Allergies:   Amoxicillin and Penicillins    Social History:  The patient  reports that he has been smoking Cigarettes.  He has been smoking about 1.00 pack per day. He has never used smokeless tobacco. He reports that he drinks alcohol. He reports that he uses drugs, including Marijuana.   Family History:  The patient's family history includes Congestive Heart Failure (age of onset: 36) in his mother; Factor V Leiden deficiency in his  sister; Heart attack (age of onset: 61) in his father; Lung cancer in his mother.    ROS:  General:no colds in Sept viral syndrome with ER visit., + weight increase Skin:no rashes or ulcers HEENT:no blurred vision, + congestion CV:see HPI PUL:see HPI GI:no diarrhea constipation or melena, no indigestion GU:no hematuria, no dysuria MS:no joint pain, no claudication + Lower  back pain with exertion. Neuro:no syncope, no lightheadedness Endo:no diabetes, no thyroid disease  Wt Readings from Last 3 Encounters:  05/17/16 251 lb 12.8 oz (114.2 kg)  02/19/16 246 lb 4 oz (111.7 kg)  02/14/16 248 lb 7 oz (112.7 kg)     PHYSICAL EXAM: VS:  BP 140/70   Pulse 83   Ht 5\' 10"  (1.778 m)   Wt 251 lb 12.8 oz (114.2 kg)   BMI 36.13 kg/m  , BMI Body mass index is 36.13 kg/m. General:Pleasant affect, NAD Skin:Warm and dry, brisk capillary refill HEENT:normocephalic, sclera clear, mucus membranes moist Neck:supple, no JVD, no bruits  Heart:S1S2 RRR without murmur, gallup, rub or click Lungs: without rales, rhonchi, + wheezes [id number], non tender, + BS, do not palpate liver spleen or masses Ext:no lower ext edema, 2+ post tib pulses, 2+ radial pulses Neuro:alert and oriented X 3, MAE, follows commands, + facial symmetry    EKG:  EKG is ordered today. The ekg ordered today demonstrates SR no changes from Sept.    Recent Labs: 02/14/2016: ALT 26 02/19/2016: BUN 36; Creatinine, Ser 1.92; Hemoglobin 14.6; Platelets 260.0; Potassium 4.5; Sodium 135    Lipid Panel    Component Value Date/Time   CHOL 167 12/19/2013 0943   TRIG 344.0 (H) 12/19/2013 0943   HDL 28.60 (L) 12/19/2013 0943   CHOLHDL 6 12/19/2013 0943   VLDL 68.8 (H) 12/19/2013 0943   LDLCALC 70 12/19/2013 0943   LDLDIRECT 95.7 01/05/2013 0946       Other studies Reviewed: Additional studies/ records that were reviewed today include: last office visit. .   ASSESSMENT AND PLAN:  1.  CAD with hx MI in 2002  No chest pain  or SOB doing well - follow up with David Cowan in 1 year unless any issues before then.    2.  HTN elevated today, his enalapril was decreased in Sept today mildly elevated, he will check at home and call us if remains elevated and we will increase the enalapril.    3. Hyperlipidemia on zocor and fenofibrate will check cmp and lipids today he is fasting.    4. Tobacco  use--not yet ready to stop   Current medicines are reviewed with the patient today.  The patient Has no concerns regarding medicines.  The following changes have been made:  See above Labs/ tests ordered today include:see above  Disposition:   FU:  see above  Signed, Cecilie Kicks, NP  05/17/2016 9:16 AM    [facility] [address], [place], [place], [place] Phone: [phone]; Fax: [phone]

## 2016-05-17 ENCOUNTER — Telehealth: Payer: Self-pay | Admitting: *Deleted

## 2016-05-17 ENCOUNTER — Encounter: Payer: Self-pay | Admitting: Cardiology

## 2016-05-17 ENCOUNTER — Ambulatory Visit (INDEPENDENT_AMBULATORY_CARE_PROVIDER_SITE_OTHER): Payer: 59 | Admitting: Cardiology

## 2016-05-17 VITALS — BP 140/70 | HR 83 | Ht 70.0 in | Wt 251.8 lb

## 2016-05-17 DIAGNOSIS — I2510 Atherosclerotic heart disease of native coronary artery without angina pectoris: Secondary | ICD-10-CM | POA: Diagnosis not present

## 2016-05-17 DIAGNOSIS — I10 Essential (primary) hypertension: Secondary | ICD-10-CM | POA: Diagnosis not present

## 2016-05-17 DIAGNOSIS — E785 Hyperlipidemia, unspecified: Secondary | ICD-10-CM | POA: Diagnosis not present

## 2016-05-17 LAB — COMPREHENSIVE METABOLIC PANEL
ALT: 12 U/L (ref 9–46)
AST: 15 U/L (ref 10–35)
Albumin: 3.9 g/dL (ref 3.6–5.1)
Alkaline Phosphatase: 34 U/L — ABNORMAL LOW (ref 40–115)
BILIRUBIN TOTAL: 0.4 mg/dL (ref 0.2–1.2)
BUN: 23 mg/dL (ref 7–25)
CALCIUM: 9.6 mg/dL (ref 8.6–10.3)
CO2: 27 mmol/L (ref 20–31)
CREATININE: 1.07 mg/dL (ref 0.70–1.33)
Chloride: 104 mmol/L (ref 98–110)
GLUCOSE: 89 mg/dL (ref 65–99)
Potassium: 5 mmol/L (ref 3.5–5.3)
SODIUM: 138 mmol/L (ref 135–146)
Total Protein: 6.4 g/dL (ref 6.1–8.1)

## 2016-05-17 LAB — LIPID PANEL
CHOL/HDL RATIO: 6.8 ratio — AB (ref <5.0)
Cholesterol: 170 mg/dL (ref <200)
HDL: 25 mg/dL — ABNORMAL LOW (ref >40)
LDL Cholesterol: 96 mg/dL (ref <100)
Triglycerides: 245 mg/dL — ABNORMAL HIGH (ref <150)
VLDL: 49 mg/dL — AB (ref <30)

## 2016-05-17 MED ORDER — NITROGLYCERIN 0.4 MG SL SUBL: 0.4000 mg | SUBLINGUAL_TABLET | SUBLINGUAL | 3 refills | Status: DC | PRN

## 2016-05-17 MED ORDER — METOPROLOL SUCCINATE ER 100 MG PO TB24: 100.0000 mg | ORAL_TABLET | Freq: Every day | ORAL | 3 refills | Status: DC

## 2016-05-17 MED ORDER — TRIAMTERENE-HCTZ 37.5-25 MG PO TABS: 1.0000 | ORAL_TABLET | Freq: Every day | ORAL | 3 refills | Status: DC

## 2016-05-17 MED ORDER — ENALAPRIL MALEATE 10 MG PO TABS: 10.0000 mg | ORAL_TABLET | Freq: Every day | ORAL | 3 refills | Status: DC

## 2016-05-17 MED ORDER — SIMVASTATIN 40 MG PO TABS: 40.0000 mg | ORAL_TABLET | Freq: Every day | ORAL | 3 refills | Status: DC

## 2016-05-17 MED ORDER — FENOFIBRATE 145 MG PO TABS: 145.0000 mg | ORAL_TABLET | Freq: Every day | ORAL | 3 refills | Status: DC

## 2016-05-17 NOTE — Patient Instructions (Signed)
Medication Instructions: Your physician recommends that you continue on your current medications as directed. Please refer to the Current Medication list given to you today.  Refills have been sent to your pharmacy.   Labwork: Your physician recommends that you return for a FASTING lipid profile and CMET today.   Follow-Up: Your physician wants you to follow-up in: 1 year with [name]. You will receive a reminder letter in the mail two months in advance. If you don't receive a letter, please call our office to schedule the follow-up appointment.  If you need a refill on your cardiac medications before your next appointment, please call your pharmacy.

## 2016-05-17 NOTE — Telephone Encounter (Signed)
-----   Message from Isaiah Serge, NP sent at 05/17/2016  4:33 PM EST ----- Cholesterol could be lower, goal is 70 or less for LDL.  Could we change to lipitor 40 mg daily. Also triglycerides are elevated needs to decrease sugar- white bread rice.  If agrees to change to lipitor repeat lipids and hepatic in 8 weeks.  Thanks.

## 2016-05-18 ENCOUNTER — Telehealth: Payer: Self-pay | Admitting: *Deleted

## 2016-05-18 DIAGNOSIS — E785 Hyperlipidemia, unspecified: Secondary | ICD-10-CM

## 2016-05-18 NOTE — Telephone Encounter (Signed)
Spoke w/ pt re: medication change.  He states that he will just watch his diet, stay on the current medication, Simvastatin, and he will recheck lipid in 3 months to see where he is and to see if he needs to change meds or not. I will put in recall for pt for lab work. Pt agreeable with this plan and verbalized understanding. Order for lab in [facility].

## 2016-05-18 NOTE — Telephone Encounter (Signed)
-----   Message from Isaiah Serge, NP sent at 05/17/2016 10:17 PM EST ----- He could change at end of 3 months or eat healthier and some exercise and recheck lipids in 3 months.

## 2016-06-07 ENCOUNTER — Other Ambulatory Visit: Payer: Self-pay | Admitting: Internal Medicine

## 2017-01-17 ENCOUNTER — Other Ambulatory Visit: Payer: Self-pay

## 2017-01-17 DIAGNOSIS — E785 Hyperlipidemia, unspecified: Secondary | ICD-10-CM

## 2017-01-17 DIAGNOSIS — I10 Essential (primary) hypertension: Secondary | ICD-10-CM

## 2017-01-17 DIAGNOSIS — I2510 Atherosclerotic heart disease of native coronary artery without angina pectoris: Secondary | ICD-10-CM

## 2017-01-17 MED ORDER — SIMVASTATIN 40 MG PO TABS: 40.0000 mg | ORAL_TABLET | Freq: Every day | ORAL | 0 refills | Status: DC

## 2017-03-15 ENCOUNTER — Telehealth: Payer: Self-pay | Admitting: Family Medicine

## 2017-03-15 NOTE — Telephone Encounter (Signed)
Pt has appt with Dr Deborra Medina on 03/17/17 at 8 AM.

## 2017-03-15 NOTE — Telephone Encounter (Signed)
Patient Name: David Cowan  DOB: [birth year]    Initial Comment caller has a "virus "t hat's going around .He has congestion & cough He has been taking mucinex for a week . Can he come in an get a shot to help get rid of the virus ?    Nurse Assessment  Nurse: Raphael Gibney, RN, Vanita Ingles Date/Time (Eastern Time): 03/15/2017 11:49:18 AM  Confirm and document reason for call. If symptomatic, describe symptoms. ---Caller states he has nasal congestion and cough. Wants to know if he needs to come in for a injection. Symptoms started a week ago on Friday. Has been taking Cloricetin and Mucinex. Has congestion in his chest.  Does the patient have any new or worsening symptoms? ---Yes  Will a triage be completed? ---Yes  Related visit to physician within the last 2 weeks? ---No  Does the PT have any chronic conditions? (i.e. diabetes, asthma, etc.) ---Yes  List chronic conditions. ---MI; HTN  Is this a behavioral health or substance abuse call? ---No     Guidelines    Guideline Title Affirmed Question Affirmed Notes  Cough - Acute Productive Cough   Sinus Pain or Congestion Lots of coughing    Final Disposition User   See PCP When Office is Open (within 3 days) Raphael Gibney, RN, Vanita Ingles    Comments  appt scheduled for 03/17/2017 at 8 am with Dr. Arnette Norris   Referrals  REFERRED TO PCP OFFICE   Caller Disagree/Comply Comply  Caller Understands Yes  PreDisposition Call Doctor

## 2017-03-17 ENCOUNTER — Ambulatory Visit (INDEPENDENT_AMBULATORY_CARE_PROVIDER_SITE_OTHER): Payer: 59 | Admitting: Family Medicine

## 2017-03-17 ENCOUNTER — Encounter: Payer: Self-pay | Admitting: Family Medicine

## 2017-03-17 VITALS — BP 138/70 | HR 76 | Temp 97.6°F | Wt 246.2 lb

## 2017-03-17 DIAGNOSIS — J069 Acute upper respiratory infection, unspecified: Secondary | ICD-10-CM

## 2017-03-17 MED ORDER — AZITHROMYCIN 250 MG PO TABS: ORAL_TABLET | ORAL | 0 refills | Status: DC

## 2017-03-17 MED ORDER — HYDROCOD POLST-CPM POLST ER 10-8 MG/5ML PO SUER: 5.0000 mL | Freq: Two times a day (BID) | ORAL | 0 refills | Status: DC | PRN

## 2017-03-17 NOTE — Progress Notes (Signed)
SUBJECTIVE:  David Cowan is a 56 y.o. male who complains of coryza, congestion, sore throat, productive cough and bilateral sinus pain for 14 days. He denies a history of anorexia and chest pain and denies a history of asthma. Patient denies smoke cigarettes.   Current Outpatient Prescriptions on File Prior to Visit  Medication Sig Dispense Refill  . acetaminophen (TYLENOL) 500 MG tablet Take 1,000 mg by mouth every 6 (six) hours as needed for mild pain or moderate pain.    Marland Kitchen aspirin 325 MG tablet Take 325 mg by mouth daily.    . cetirizine (ZYRTEC) 10 MG tablet Take 10 mg by mouth daily as needed for allergies.    Marland Kitchen enalapril (VASOTEC) 10 MG tablet Take 1 tablet (10 mg total) by mouth daily. 90 tablet 3  . fenofibrate (TRICOR) 145 MG tablet Take 1 tablet (145 mg total) by mouth daily. 90 tablet 3  . fluticasone (FLONASE) 50 MCG/ACT nasal spray Place 2 sprays into both nostrils as needed for allergies or rhinitis. 16 g 3  . folic acid (FOLVITE) 1 MG tablet Take 1 tablet (1 mg total) by mouth daily. 90 tablet 3  . metoprolol succinate (TOPROL-XL) 100 MG 24 hr tablet Take 1 tablet (100 mg total) by mouth at bedtime. Take with or immediately following a meal. 90 tablet 3  . nitroGLYCERIN (NITROSTAT) 0.4 MG SL tablet Place 1 tablet (0.4 mg total) under the tongue every 5 (five) minutes as needed for chest pain. 25 tablet 3  . simvastatin (ZOCOR) 40 MG tablet Take 1 tablet (40 mg total) by mouth at bedtime. 90 tablet 0  . triamterene-hydrochlorothiazide (MAXZIDE-25) 37.5-25 MG tablet Take 1 tablet by mouth daily. 90 tablet 3   No current facility-administered medications on file prior to visit.     Allergies  Allergen Reactions  . Amoxicillin Rash  . Penicillins Rash    Past Medical History:  Diagnosis Date  . Arthritis   . Bronchitis   . CAD (coronary artery disease)   . Dyslipidemia   . Hand pain   . Hard of hearing    left ear  . History of MI (myocardial infarction)   .  Hyperlipidemia   . Hypertension   . Myocardial infarction ([place]) 2000  . Right flank pain     Past Surgical History:  Procedure Laterality Date  . CARDIAC CATHETERIZATION    . 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 Heart attack Father 26  . Coronary artery disease Unknown         fam hx, early  . Heart attack Unknown 40       uncle  . Factor V Leiden deficiency Sister   . Congestive Heart Failure Mother 10  . Lung cancer Mother   . Colon cancer Neg Hx     Social History   Social History  . Marital status: Married    Spouse name: N/A  . Number of children: 2  . Years of education: N/A   Occupational History  . SERVICE Adult nurse Treatment   Social History Main Topics  . Smoking status: Current Every Day Smoker    Packs/day: 1.00    Types: Cigarettes  . Smokeless tobacco: Never Used  . Alcohol use 0.0 oz/week  Comment: socailly  . Drug use: Yes    Types: Marijuana     Comment: occ use  . Sexual activity: Not on file   Other Topics Concern  . Not on file   Social History Narrative   2 daughters   The PMH, PSH, Social History, Family History, Medications, and allergies have been reviewed in [facility], and have been updated if relevant.  OBJECTIVE: BP 138/70 (BP Location: Left Arm, Patient Position: Sitting, Cuff Size: Normal)   Pulse 76   Temp 97.6 F (36.4 C) (Oral)   Wt 246 lb 4 oz (111.7 kg)   SpO2 92%   BMI 35.33 kg/m   He appears well, vital signs are as noted. Ears normal.  Throat and pharynx normal.  Neck supple. No adenopathy in the neck. Nose is congested. Sinuses tender. Scattered wheezes  ASSESSMENT:  sinusitis and bronchitis  PLAN: Given duration and progression of symptoms, will treat for bacterial infection with  zpack, hycodan as needed for cough.  Symptomatic therapy suggested: push fluids, rest and return office visit prn if symptoms persist or worsen.  Call or return to clinic prn if these symptoms worsen or fail to improve as anticipated.

## 2017-04-13 ENCOUNTER — Other Ambulatory Visit: Payer: Self-pay | Admitting: Internal Medicine

## 2017-04-13 DIAGNOSIS — E785 Hyperlipidemia, unspecified: Secondary | ICD-10-CM

## 2017-04-13 DIAGNOSIS — I10 Essential (primary) hypertension: Secondary | ICD-10-CM

## 2017-04-13 DIAGNOSIS — I2510 Atherosclerotic heart disease of native coronary artery without angina pectoris: Secondary | ICD-10-CM

## 2017-05-03 ENCOUNTER — Other Ambulatory Visit: Payer: Self-pay | Admitting: Internal Medicine

## 2017-05-03 ENCOUNTER — Other Ambulatory Visit: Payer: Self-pay | Admitting: Cardiology

## 2017-05-03 DIAGNOSIS — E785 Hyperlipidemia, unspecified: Secondary | ICD-10-CM

## 2017-05-03 DIAGNOSIS — I2510 Atherosclerotic heart disease of native coronary artery without angina pectoris: Secondary | ICD-10-CM

## 2017-05-03 DIAGNOSIS — I10 Essential (primary) hypertension: Secondary | ICD-10-CM

## 2017-05-04 NOTE — Telephone Encounter (Signed)
Ok to fill until patient f/u appt in January 2019 with [name].  Further refills will require doctor visit.

## 2017-05-11 ENCOUNTER — Encounter: Payer: Self-pay | Admitting: Internal Medicine

## 2017-05-17 ENCOUNTER — Other Ambulatory Visit: Payer: Self-pay | Admitting: Cardiology

## 2017-05-17 DIAGNOSIS — E785 Hyperlipidemia, unspecified: Secondary | ICD-10-CM

## 2017-05-17 DIAGNOSIS — I10 Essential (primary) hypertension: Secondary | ICD-10-CM

## 2017-05-17 DIAGNOSIS — I2510 Atherosclerotic heart disease of native coronary artery without angina pectoris: Secondary | ICD-10-CM

## 2017-05-20 ENCOUNTER — Other Ambulatory Visit: Payer: Self-pay | Admitting: *Deleted

## 2017-05-20 DIAGNOSIS — E785 Hyperlipidemia, unspecified: Secondary | ICD-10-CM

## 2017-05-20 DIAGNOSIS — I2510 Atherosclerotic heart disease of native coronary artery without angina pectoris: Secondary | ICD-10-CM

## 2017-05-20 DIAGNOSIS — I10 Essential (primary) hypertension: Secondary | ICD-10-CM

## 2017-05-20 MED ORDER — METOPROLOL SUCCINATE ER 100 MG PO TB24: 100.0000 mg | ORAL_TABLET | Freq: Every day | ORAL | 0 refills | Status: DC

## 2017-05-21 NOTE — Telephone Encounter (Signed)
Please route to individual provider Dorene Ar, this was sent to pre-op pool

## 2017-05-25 NOTE — Telephone Encounter (Signed)
This should be 90 tabs with 3 refills.  Please adjust.

## 2017-05-25 NOTE — Telephone Encounter (Signed)
Medication Detail    Disp Refills Start End   metoprolol succinate (TOPROL-XL) 100 MG 24 hr tablet 90 tablet 0 05/20/2017    Sig - Route: Take 1 tablet (100 mg total) by mouth at bedtime. Take with or immediately following a meal. - Oral   Sent to pharmacy as: metoprolol succinate (TOPROL-XL) 100 MG 24 hr tablet   E-Prescribing Status: Receipt confirmed by pharmacy (05/20/2017 8:57 AM EST)   Associated Diagnoses   Essential hypertension, benign     Hyperlipidemia, unspecified hyperlipidemia type     Essential hypertension     Atherosclerosis of native coronary artery of native heart without angina pectoris     Pharmacy   [facility], [place]

## 2017-06-22 ENCOUNTER — Other Ambulatory Visit: Payer: 59

## 2017-06-24 ENCOUNTER — Encounter: Payer: Self-pay | Admitting: *Deleted

## 2017-06-26 ENCOUNTER — Other Ambulatory Visit: Payer: Self-pay | Admitting: Cardiology

## 2017-06-26 DIAGNOSIS — E785 Hyperlipidemia, unspecified: Secondary | ICD-10-CM

## 2017-06-26 DIAGNOSIS — I10 Essential (primary) hypertension: Secondary | ICD-10-CM

## 2017-06-26 DIAGNOSIS — I2510 Atherosclerotic heart disease of native coronary artery without angina pectoris: Secondary | ICD-10-CM

## 2017-06-27 NOTE — Telephone Encounter (Signed)
Please review for refill, thanks ! 

## 2017-06-29 ENCOUNTER — Ambulatory Visit: Payer: 59 | Admitting: Internal Medicine

## 2017-07-07 ENCOUNTER — Telehealth: Payer: Self-pay

## 2017-07-07 DIAGNOSIS — E785 Hyperlipidemia, unspecified: Secondary | ICD-10-CM

## 2017-07-07 DIAGNOSIS — I10 Essential (primary) hypertension: Secondary | ICD-10-CM

## 2017-07-07 DIAGNOSIS — I2510 Atherosclerotic heart disease of native coronary artery without angina pectoris: Secondary | ICD-10-CM

## 2017-07-07 DIAGNOSIS — I252 Old myocardial infarction: Secondary | ICD-10-CM

## 2017-07-07 NOTE — Telephone Encounter (Signed)
Message sent via MyChart d/t no answer at phone number.

## 2017-07-13 ENCOUNTER — Other Ambulatory Visit: Payer: 59 | Admitting: *Deleted

## 2017-07-13 ENCOUNTER — Encounter: Payer: Self-pay | Admitting: Internal Medicine

## 2017-07-13 ENCOUNTER — Ambulatory Visit (INDEPENDENT_AMBULATORY_CARE_PROVIDER_SITE_OTHER): Payer: 59 | Admitting: Internal Medicine

## 2017-07-13 VITALS — BP 110/68 | HR 85 | Ht 71.0 in | Wt 247.8 lb

## 2017-07-13 DIAGNOSIS — I10 Essential (primary) hypertension: Secondary | ICD-10-CM

## 2017-07-13 DIAGNOSIS — E785 Hyperlipidemia, unspecified: Secondary | ICD-10-CM

## 2017-07-13 DIAGNOSIS — M25511 Pain in right shoulder: Secondary | ICD-10-CM

## 2017-07-13 DIAGNOSIS — I2510 Atherosclerotic heart disease of native coronary artery without angina pectoris: Secondary | ICD-10-CM

## 2017-07-13 DIAGNOSIS — I252 Old myocardial infarction: Secondary | ICD-10-CM

## 2017-07-13 LAB — HEPATIC FUNCTION PANEL
ALK PHOS: 42 IU/L (ref 39–117)
ALT: 17 IU/L (ref 0–44)
AST: 22 IU/L (ref 0–40)
Albumin: 3.8 g/dL (ref 3.5–5.5)
BILIRUBIN TOTAL: 0.4 mg/dL (ref 0.0–1.2)
BILIRUBIN, DIRECT: 0.14 mg/dL (ref 0.00–0.40)
Total Protein: 6.1 g/dL (ref 6.0–8.5)

## 2017-07-13 LAB — BASIC METABOLIC PANEL
BUN / CREAT RATIO: 19 (ref 9–20)
BUN: 27 mg/dL — AB (ref 6–24)
CO2: 21 mmol/L (ref 20–29)
CREATININE: 1.4 mg/dL — AB (ref 0.76–1.27)
Calcium: 9.2 mg/dL (ref 8.7–10.2)
Chloride: 97 mmol/L (ref 96–106)
GFR, EST AFRICAN AMERICAN: 64 mL/min/{1.73_m2} (ref >59)
GFR, EST NON AFRICAN AMERICAN: 56 mL/min/{1.73_m2} — AB (ref >59)
Glucose: 92 mg/dL (ref 65–99)
Potassium: 4.6 mmol/L (ref 3.5–5.2)
Sodium: 136 mmol/L (ref 134–144)

## 2017-07-13 LAB — LIPID PANEL
Chol/HDL Ratio: 4.2 ratio (ref 0.0–5.0)
Cholesterol, Total: 163 mg/dL (ref 100–199)
HDL: 39 mg/dL — ABNORMAL LOW (ref >39)
LDL Calculated: 94 mg/dL (ref 0–99)
Triglycerides: 152 mg/dL — ABNORMAL HIGH (ref 0–149)
VLDL CHOLESTEROL CAL: 30 mg/dL (ref 5–40)

## 2017-07-13 LAB — TSH: TSH: 2.03 u[IU]/mL (ref 0.450–4.500)

## 2017-07-13 NOTE — Patient Instructions (Addendum)
Medication Instructions:  Your physician recommends that you continue on your current medications as directed. Please refer to the Current Medication list given to you today.  Labwork: You got lab work today.  I will call you with the results.  Testing/Procedures: None ordered.  Follow-Up: Your physician wants you to follow-up in: one year with [name].   You will receive a reminder letter in the mail two months in advance. If you don't receive a letter, please call our office to schedule the follow-up appointment.  Any Other Special Instructions Will Be Listed Below (If Applicable).  Referred to Sports Medicine Dr. Hulan Saas at [facility].  If you need a refill on your cardiac medications before your next appointment, please call your pharmacy.

## 2017-07-13 NOTE — Progress Notes (Signed)
HPI David Cowan returns today for followup. He is a pleasant 57 yo man with CAD, s/p remoate NSTEMI, dyslipidemia, HTN, and arthritis. He has undergone left hip replacement in the past. He is back to work with no limitation. He denies anginal symptoms despite his physically demanding job.  Allergies  Allergen Reactions  . Amoxicillin Rash  . Penicillins Rash     Current Outpatient Medications  Medication Sig Dispense Refill  . acetaminophen (TYLENOL) 500 MG tablet Take 1,000 mg by mouth every 6 (six) hours as needed for mild pain or moderate pain.    Marland Kitchen aspirin 325 MG tablet Take 325 mg by mouth daily.    Marland Kitchen azithromycin (ZITHROMAX) 250 MG tablet 2 tabs by mouth on day 1 followed by 1 tab by mouth daily days 2- 5 6 tablet 0  . cetirizine (ZYRTEC) 10 MG tablet Take 10 mg by mouth daily as needed for allergies.    . chlorpheniramine-HYDROcodone (TUSSIONEX PENNKINETIC ER) 10-8 MG/5ML SUER Take 5 mLs by mouth every 12 (twelve) hours as needed. 140 mL 0  . enalapril (VASOTEC) 10 MG tablet Take 1 tablet (10 mg total) by mouth daily. 90 tablet 3  . fenofibrate (TRICOR) 145 MG tablet TAKE 1 TABLET BY MOUTH  DAILY 30 tablet 0  . fluticasone (FLONASE) 50 MCG/ACT nasal spray Place 2 sprays into both nostrils as needed for allergies or rhinitis. 16 g 3  . folic acid (FOLVITE) 1 MG tablet TAKE 1 TABLET BY MOUTH  DAILY 90 tablet 3  . metoprolol succinate (TOPROL-XL) 100 MG 24 hr tablet Take 1 tablet (100 mg total) by mouth at bedtime. Take with or immediately following a meal. 90 tablet 0  . nitroGLYCERIN (NITROSTAT) 0.4 MG SL tablet Place 1 tablet (0.4 mg total) under the tongue every 5 (five) minutes as needed for chest pain. 25 tablet 3  . simvastatin (ZOCOR) 40 MG tablet Take 1 tablet (40 mg total) by mouth daily at 6 PM. Please keep upcoming appt with [name] for future refills. Thanks 90 tablet 0  . triamterene-hydrochlorothiazide (MAXZIDE-25) 37.5-25 MG tablet Take 1 tablet by mouth daily.  Please keep 06-29-17 at 8:45 am appointment for additional refills thanks. 90 tablet 0   No current facility-administered medications for this visit.      Past Medical History:  Diagnosis Date  . Arthritis   . Bronchitis   . CAD (coronary artery disease)   . Dyslipidemia   . Hand pain   . Hard of hearing    left ear  . History of MI (myocardial infarction)   . Hyperlipidemia   . Hypertension   . Myocardial infarction ([place]) 2000  . Right flank pain     ROS:   All systems reviewed and negative except as noted in the HPI.   Past Surgical History:  Procedure Laterality Date  . CARDIAC CATHETERIZATION    . TOTAL HIP ARTHROPLASTY Left 12/10/2014   Procedure: LEFT TOTAL HIP ARTHROPLASTY ANTERIOR APPROACH;  Surgeon: Paralee Cancel, MD;  Location: [facility];  Service: Orthopedics;  Laterality: Left;  Marland Kitchen VASECTOMY       Family History  Problem Relation Age of Onset  . Heart attack Father 42  . Coronary artery disease Unknown         fam hx, early  . Heart attack Unknown 40       uncle  . Factor V Leiden deficiency Sister   . Congestive Heart Failure Mother 33  . Lung  cancer Mother   . Colon cancer Neg Hx      Social History   Socioeconomic History  . Marital status: Married    Spouse name: Not on file  . Number of children: 2  . Years of education: Not on file  . Highest education level: Not on file  Social Needs  . Financial resource strain: Not on file  . Food insecurity - worry: Not on file  . Food insecurity - inability: Not on file  . Transportation needs - medical: Not on file  . Transportation needs - non-medical: Not on file  Occupational History  . Occupation: SERVICE MANAGER    Employer: [employer]  Tobacco Use  . Smoking status: Current Every Day Smoker    Packs/day: 1.00    Types: Cigarettes  . Smokeless tobacco: Never Used  Substance and Sexual Activity  . Alcohol use: Yes    Alcohol/week: 0.0 oz    Comment: socailly  . Drug use: Yes      Types: Marijuana    Comment: occ use  . Sexual activity: Not on file  Other Topics Concern  . Not on file  Social History Narrative   2 daughters     BP 110/68   Pulse 85   Ht 5\' 11"  (1.803 m)   Wt 247 lb 12.8 oz (112.4 kg)   BMI 34.56 kg/m   Physical Exam:  Well appearing 57 yo man, overweight, NAD HEENT: Unremarkable Neck:  6 cm JVD, no thyromegally Lymphatics:  No adenopathy Back:  No CVA tenderness Lungs:  Clear with no wheezes HEART:  Regular rate rhythm, no murmurs, no rubs, no clicks Abd:  soft, positive bowel sounds, no organomegally, no rebound, no guarding Ext:  2 plus pulses, no edema, no cyanosis, no clubbing Skin:  No rashes no nodules Neuro:  CN II through XII intact, motor grossly intact  EKG - nsr  Assess/Plan: 1. CAD - he denies anginal symptoms. I have encouraged the patient to increase his activity and to lose weight. 2. Dyslipidemia - we will obtain fasting lipids today. He has not been able to tolerate higher doses of statin and might be a candidate for a PCSK9 inhibitor. 3. HTN - his blood pressure is well controlled. He will continue his current meds and he is encouraged to lose weight.  David Cowan.D.

## 2017-07-20 ENCOUNTER — Other Ambulatory Visit: Payer: Self-pay | Admitting: Cardiology

## 2017-07-20 DIAGNOSIS — E785 Hyperlipidemia, unspecified: Secondary | ICD-10-CM

## 2017-07-20 DIAGNOSIS — I2510 Atherosclerotic heart disease of native coronary artery without angina pectoris: Secondary | ICD-10-CM

## 2017-07-20 DIAGNOSIS — I10 Essential (primary) hypertension: Secondary | ICD-10-CM

## 2017-07-21 NOTE — Telephone Encounter (Signed)
REFILL 

## 2017-08-01 ENCOUNTER — Other Ambulatory Visit: Payer: Self-pay | Admitting: Cardiology

## 2017-08-01 DIAGNOSIS — E785 Hyperlipidemia, unspecified: Secondary | ICD-10-CM

## 2017-08-01 DIAGNOSIS — I10 Essential (primary) hypertension: Secondary | ICD-10-CM

## 2017-08-01 DIAGNOSIS — I2510 Atherosclerotic heart disease of native coronary artery without angina pectoris: Secondary | ICD-10-CM

## 2017-08-02 NOTE — Telephone Encounter (Signed)
REFILL 

## 2017-08-19 ENCOUNTER — Telehealth: Payer: Self-pay | Admitting: *Deleted

## 2017-08-19 NOTE — Telephone Encounter (Signed)
Patient would like a call back to go over medication with Dr. Tanna Furry nurse.

## 2017-08-22 NOTE — Telephone Encounter (Signed)
Patient tells me that he forgot to ask [name] if he can take Meloxicam 15 mg PRN.  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 It he takes Meloxican the "achiness goes away after 30 minutes". His PCP has moved and he is temporarily without one.  He would like to know if [name] is agreeable to him taking medication and if he would prescribe. Pt understands that it will be next week before hearing back from our office as [name] and his nurse are both on vacation this week.

## 2017-08-30 ENCOUNTER — Telehealth: Payer: Self-pay

## 2017-08-30 MED ORDER — MELOXICAM 15 MG PO TABS: 15.0000 mg | ORAL_TABLET | Freq: Every day | ORAL | 0 refills | Status: DC | PRN

## 2017-08-30 NOTE — Telephone Encounter (Signed)
Spoke with Pt.  Notified Pt [name] had approved 3 months of meloxicam only.  Further refills must be made by PCP.  Pt states his PCP Deborra Medina had just moved and he hadn't decided if he would get another one at same practice or go with her.  Reiterated no further refills.  Pt indicates understanding.

## 2017-10-23 ENCOUNTER — Other Ambulatory Visit: Payer: Self-pay | Admitting: Internal Medicine

## 2017-10-23 DIAGNOSIS — E785 Hyperlipidemia, unspecified: Secondary | ICD-10-CM

## 2017-10-23 DIAGNOSIS — I2510 Atherosclerotic heart disease of native coronary artery without angina pectoris: Secondary | ICD-10-CM

## 2017-10-23 DIAGNOSIS — I10 Essential (primary) hypertension: Secondary | ICD-10-CM

## 2017-11-10 ENCOUNTER — Other Ambulatory Visit: Payer: Self-pay | Admitting: *Deleted

## 2017-11-10 DIAGNOSIS — I2510 Atherosclerotic heart disease of native coronary artery without angina pectoris: Secondary | ICD-10-CM

## 2017-11-10 DIAGNOSIS — E785 Hyperlipidemia, unspecified: Secondary | ICD-10-CM

## 2017-11-10 DIAGNOSIS — I10 Essential (primary) hypertension: Secondary | ICD-10-CM

## 2017-11-10 MED ORDER — SIMVASTATIN 40 MG PO TABS: ORAL_TABLET | ORAL | 2 refills | Status: DC

## 2017-11-10 MED ORDER — TRIAMTERENE-HCTZ 37.5-25 MG PO TABS: 1.0000 | ORAL_TABLET | Freq: Every day | ORAL | 2 refills | Status: DC

## 2017-11-28 ENCOUNTER — Encounter: Payer: 59 | Admitting: Primary Care

## 2017-12-26 ENCOUNTER — Other Ambulatory Visit: Payer: Self-pay | Admitting: Cardiology

## 2017-12-26 DIAGNOSIS — I10 Essential (primary) hypertension: Secondary | ICD-10-CM

## 2017-12-26 DIAGNOSIS — I2510 Atherosclerotic heart disease of native coronary artery without angina pectoris: Secondary | ICD-10-CM

## 2017-12-26 DIAGNOSIS — E785 Hyperlipidemia, unspecified: Secondary | ICD-10-CM

## 2017-12-27 NOTE — Telephone Encounter (Signed)
Pt's pharmacy is requesting a refill on fenofibrate. Would you like to refill this medication? Please address

## 2017-12-28 ENCOUNTER — Other Ambulatory Visit: Payer: Self-pay

## 2017-12-28 DIAGNOSIS — I10 Essential (primary) hypertension: Secondary | ICD-10-CM

## 2017-12-28 DIAGNOSIS — I2510 Atherosclerotic heart disease of native coronary artery without angina pectoris: Secondary | ICD-10-CM

## 2017-12-28 DIAGNOSIS — E785 Hyperlipidemia, unspecified: Secondary | ICD-10-CM

## 2017-12-28 MED ORDER — FENOFIBRATE 145 MG PO TABS: 145.0000 mg | ORAL_TABLET | Freq: Every day | ORAL | 0 refills | Status: DC

## 2018-01-22 ENCOUNTER — Other Ambulatory Visit: Payer: Self-pay | Admitting: Cardiology

## 2018-01-22 DIAGNOSIS — I10 Essential (primary) hypertension: Secondary | ICD-10-CM

## 2018-01-22 DIAGNOSIS — I2510 Atherosclerotic heart disease of native coronary artery without angina pectoris: Secondary | ICD-10-CM

## 2018-01-22 DIAGNOSIS — E785 Hyperlipidemia, unspecified: Secondary | ICD-10-CM

## 2018-02-22 ENCOUNTER — Ambulatory Visit: Payer: BLUE CROSS/BLUE SHIELD | Admitting: Family Medicine

## 2018-02-22 ENCOUNTER — Encounter: Payer: Self-pay | Admitting: Family Medicine

## 2018-02-22 ENCOUNTER — Ambulatory Visit (INDEPENDENT_AMBULATORY_CARE_PROVIDER_SITE_OTHER): Payer: BLUE CROSS/BLUE SHIELD

## 2018-02-22 DIAGNOSIS — I739 Peripheral vascular disease, unspecified: Secondary | ICD-10-CM | POA: Diagnosis not present

## 2018-02-22 DIAGNOSIS — M542 Cervicalgia: Secondary | ICD-10-CM | POA: Diagnosis not present

## 2018-02-22 NOTE — Assessment & Plan Note (Signed)
Suspect DDD of cervical spine. Xray today.  No radiculopathy which is good but duration of symptoms concerning for chronic disease.

## 2018-02-22 NOTE — Assessment & Plan Note (Signed)
New-concerning for PAD since it is aggravated by exertion and relieved by rest. He is a smoker. He is taking ASA and statin. Will order ABIs/arterial US of bilateral extremities.

## 2018-02-22 NOTE — Patient Instructions (Signed)
Great to see you.  I will call you with your xray results from today.  Please stop by up front to schedule the ultrasounds of your leg.

## 2018-02-22 NOTE — Progress Notes (Signed)
Subjective:   Patient ID: David Cowan, male    DOB: March 13, 1961, 57 y.o.   MRN: [id number]  David Cowan is a pleasant 57 y.o. year old male who presents to clinic today with Neck Pain (Patient is here today C/O neck pain.  He states that it started in January.  Denies any injury.  It is located on the right back side.  He turns his head and there is popping. Does not have full ROM.  )  on 02/22/2018  HPI:  Neck pain- started in January of this year.  No known injury. Intermittent, mainly right sided.  He has full range of motion of his neck but when he turns his neck, he does feel it pop.  No tingling in his fingers or weakness in his hands.  No decreased grip strength. Went to chiropractor but those adjustments did not help much.  Also has noticed pain in his calfs, right > left, worsened by walking and relieved by rest.  He is on ASA and statin.  Has never had anything like this before. He has noticed that for past couple of weeks.  Also noticed some intermittent LE edema and tingling in his toes.    Current Outpatient Medications on File Prior to Visit  Medication Sig Dispense Refill  . aspirin 325 MG tablet Take 325 mg by mouth daily.    . cetirizine (ZYRTEC) 10 MG tablet Take 10 mg by mouth daily as needed for allergies.    Marland Kitchen enalapril (VASOTEC) 10 MG tablet TAKE 1 TABLET BY MOUTH  DAILY 90 tablet 3  . fenofibrate (TRICOR) 145 MG tablet TAKE 1 TABLET BY MOUTH EVERY DAY 30 tablet 6  . fluticasone (FLONASE) 50 MCG/ACT nasal spray Place 2 sprays into both nostrils as needed for allergies or rhinitis. 16 g 3  . meloxicam (MOBIC) 15 MG tablet Take 1 tablet (15 mg total) by mouth daily as needed for pain. 90 tablet 0  . metoprolol succinate (TOPROL-XL) 100 MG 24 hr tablet TAKE 1 TABLET BY MOUTH AT  BEDTIME. TAKE WITH OR  IMMEDIATELY FOLLOWING A  MEAL. 90 tablet 3  . nitroGLYCERIN (NITROSTAT) 0.4 MG SL tablet Place 1 tablet (0.4 mg total) under the tongue every 5 (five) minutes as needed  for chest pain. 25 tablet 3  . simvastatin (ZOCOR) 40 MG tablet TAKE 1 TABLET BY MOUTH DAILY AT 6 PM. 90 tablet 2  . triamterene-hydrochlorothiazide (MAXZIDE-25) 37.5-25 MG tablet Take 1 tablet by mouth daily. 90 tablet 2   No current facility-administered medications on file prior to visit.     Allergies  Allergen Reactions  . Amoxicillin Rash  . Penicillins Rash    Past Medical History:  Diagnosis Date  . Arthritis   . Bronchitis   . CAD (coronary artery disease)   . Dyslipidemia   . Hand pain   . Hard of hearing    left ear  . History of MI (myocardial infarction)   . Hyperlipidemia   . Hypertension   . Myocardial infarction ([place]) 2000  . Right flank pain     Past Surgical History:  Procedure Laterality Date  . CARDIAC CATHETERIZATION    . 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 Heart attack Father 7  .  Coronary artery disease Unknown         fam hx, early  . Heart attack Unknown 40       uncle  . Factor V Leiden deficiency Sister   . Congestive Heart Failure Mother 40  . Lung cancer Mother   . Colon cancer Neg Hx     Social History   Socioeconomic History  . Marital status: Married    Spouse name: Not on file  . Number of children: 2  . Years of education: Not on file  . Highest education level: Not on file  Occupational History  . Occupation: SERVICE MANAGER    Employer: [employer]  . Financial resource strain: Not on file  . Food insecurity:    Worry: Not on file    Inability: Not on file  . Transportation needs:    Medical: Not on file    Non-medical: Not on file  Tobacco Use  . Smoking status: Current Every Day Smoker    Packs/day: 1.00    Types: Cigarettes  . Smokeless tobacco: Never Used    Substance and Sexual Activity  . Alcohol use: Yes    Alcohol/week: 0.0 standard drinks    Comment: socailly  . Drug use: Yes    Types: Marijuana    Comment: occ use  . Sexual activity: Not on file  Lifestyle  . Physical activity:    Days per week: Not on file    Minutes per session: Not on file  . Stress: Not on file  Relationships  . Social connections:    Talks on phone: Not on file    Gets together: Not on file    Attends religious service: Not on file    Active member of club or organization: Not on file    Attends meetings of clubs or organizations: Not on file    Relationship status: Not on file  . Intimate partner violence:    Fear of current or ex partner: Not on file    Emotionally abused: Not on file    Physically abused: Not on file    Forced sexual activity: Not on file  Other Topics Concern  . Not on file  Social History Narrative   2 daughters   The PMH, PSH, Social History, Family History, Medications, and allergies have been reviewed in [facility], and have been updated if relevant.   Review of Systems  Constitutional: Negative.   HENT: Negative.   Eyes: Negative.   Respiratory: Negative.   Cardiovascular: Negative.   Endocrine: Negative.   Musculoskeletal: Positive for myalgias, neck pain and neck stiffness.  Skin: Negative.   Allergic/Immunologic: Negative.   Neurological: Negative for light-headedness and numbness.  Psychiatric/Behavioral: Negative.   All other systems reviewed and are negative.      Objective:    BP (!) 156/92 (BP Location: Left Arm, Cuff Size: Normal)   Pulse 75   Temp 98.7 F (37.1 C) (Oral)   Ht 5\' 11"  (1.803 m)   Wt 254 lb 6.4 oz (115.4 kg)   SpO2 95%   BMI 35.48 kg/m    Physical Exam  Constitutional: He is oriented to person, place, and time. He appears well-developed and well-nourished. No distress.  HENT:  Head: Normocephalic and atraumatic.  Eyes: EOM are normal.  Neck: Neck supple. Spinous process tenderness  and muscular tenderness present. No neck rigidity.  Cardiovascular: Normal rate.  Pulmonary/Chest: Effort normal.  Musculoskeletal: Normal range of motion. He exhibits no  edema.  Neurological: He is alert and oriented to person, place, and time. No cranial nerve deficit. Coordination normal.  Skin: Skin is warm and dry. He is not diaphoretic.  Psychiatric: He has a normal mood and affect. His behavior is normal. Judgment and thought content normal.  Nursing note and vitals reviewed.         Assessment & Plan:   Intermittent claudication ([place]) - Plan: VAS [place] ABI WITH/WO TBI  Neck pain - Plan: DG Cervical Spine Complete, DG Cervical Spine Complete No follow-ups on file.

## 2018-02-24 ENCOUNTER — Other Ambulatory Visit: Payer: Self-pay | Admitting: Family Medicine

## 2018-02-24 DIAGNOSIS — I739 Peripheral vascular disease, unspecified: Secondary | ICD-10-CM

## 2018-02-28 ENCOUNTER — Telehealth: Payer: Self-pay | Admitting: Family Medicine

## 2018-02-28 NOTE — Telephone Encounter (Signed)
Pt given x-ray results per notes of Dr. Deborra Medina on 02/28/18, patient verbalized understanding. He says he will wait to find out about his leg tomorrow before making a decision about his neck. He says he's been to [place] about 3 years ago when he had a hip replacement and he's never seen a spine specialist. Unable to document in result note due to result note not routed to [facility].

## 2018-03-01 ENCOUNTER — Ambulatory Visit (HOSPITAL_COMMUNITY)
Admission: RE | Admit: 2018-03-01 | Discharge: 2018-03-01 | Disposition: A | Discharge status: Home / Self Care | Payer: BLUE CROSS/BLUE SHIELD | Source: Ambulatory Visit | Attending: Cardiovascular Disease | Admitting: Cardiovascular Disease

## 2018-03-01 DIAGNOSIS — I739 Peripheral vascular disease, unspecified: Secondary | ICD-10-CM

## 2018-03-02 ENCOUNTER — Telehealth: Payer: Self-pay | Admitting: Internal Medicine

## 2018-03-02 NOTE — Telephone Encounter (Signed)
New Message    Patient is calling because he is to have a MRI done on 10/04. He wants to know is that the first available. I am showing that he is scheduled for a LE study. Please call patient to discuss.

## 2018-03-03 ENCOUNTER — Other Ambulatory Visit: Payer: Self-pay | Admitting: Family Medicine

## 2018-03-03 DIAGNOSIS — I739 Peripheral vascular disease, unspecified: Secondary | ICD-10-CM

## 2018-03-08 ENCOUNTER — Other Ambulatory Visit: Payer: Self-pay | Admitting: Internal Medicine

## 2018-03-08 DIAGNOSIS — I10 Essential (primary) hypertension: Secondary | ICD-10-CM

## 2018-03-08 DIAGNOSIS — I2510 Atherosclerotic heart disease of native coronary artery without angina pectoris: Secondary | ICD-10-CM

## 2018-03-08 DIAGNOSIS — E785 Hyperlipidemia, unspecified: Secondary | ICD-10-CM

## 2018-03-13 ENCOUNTER — Ambulatory Visit (HOSPITAL_COMMUNITY)
Admission: RE | Admit: 2018-03-13 | Discharge: 2018-03-13 | Disposition: A | Discharge status: Home / Self Care | Payer: BLUE CROSS/BLUE SHIELD | Source: Ambulatory Visit | Attending: Cardiovascular Disease | Admitting: Cardiovascular Disease

## 2018-03-13 DIAGNOSIS — I739 Peripheral vascular disease, unspecified: Secondary | ICD-10-CM | POA: Diagnosis not present

## 2018-03-16 ENCOUNTER — Other Ambulatory Visit: Payer: Self-pay | Admitting: Family Medicine

## 2018-03-16 DIAGNOSIS — I70209 Unspecified atherosclerosis of native arteries of extremities, unspecified extremity: Secondary | ICD-10-CM

## 2018-03-17 ENCOUNTER — Encounter (HOSPITAL_COMMUNITY): Payer: BLUE CROSS/BLUE SHIELD

## 2018-03-21 ENCOUNTER — Encounter: Payer: Self-pay | Admitting: Cardiovascular Disease

## 2018-03-21 ENCOUNTER — Ambulatory Visit: Payer: BLUE CROSS/BLUE SHIELD | Admitting: Cardiovascular Disease

## 2018-03-21 VITALS — BP 128/86 | HR 83 | Ht 70.0 in | Wt 251.2 lb

## 2018-03-21 DIAGNOSIS — I10 Essential (primary) hypertension: Secondary | ICD-10-CM

## 2018-03-21 DIAGNOSIS — Z720 Tobacco use: Secondary | ICD-10-CM | POA: Diagnosis not present

## 2018-03-21 DIAGNOSIS — I739 Peripheral vascular disease, unspecified: Secondary | ICD-10-CM

## 2018-03-21 DIAGNOSIS — E785 Hyperlipidemia, unspecified: Secondary | ICD-10-CM | POA: Diagnosis not present

## 2018-03-21 MED ORDER — ATORVASTATIN CALCIUM 40 MG PO TABS: 40.0000 mg | ORAL_TABLET | Freq: Every day | ORAL | 0 refills | Status: DC

## 2018-03-21 MED ORDER — ASPIRIN 81 MG PO TABS: 81.0000 mg | ORAL_TABLET | Freq: Every day | ORAL | 3 refills | Status: AC

## 2018-03-21 MED ORDER — CILOSTAZOL 50 MG PO TABS: 50.0000 mg | ORAL_TABLET | Freq: Two times a day (BID) | ORAL | 0 refills | Status: DC

## 2018-03-21 NOTE — Patient Instructions (Addendum)
Medication Instructions:  DECREASE Aspirin 81 mg daily  STOP simvastatin START atorvastatin 40 mg daily  START cilostazol (Pletal) 50 mg two times daily  If you need a refill on your cardiac medications before your next appointment, please call your pharmacy.   Lab work: Please return for FASTING labs in 6 weeks (Lipid, Hepatic)  Our in office lab hours are Monday-Friday 8:00-4:00, closed for lunch 12:45-1:45 pm.  No appointment needed.  If you have labs (blood work) drawn today and your tests are completely normal, you will receive your results only by: Marland Kitchen MyChart Message (if you have MyChart) OR . A paper copy in the mail If you have any lab test that is abnormal or we need to change your treatment, we will call you to review the results.  Follow-Up: 3 months with Dr. Fletcher Anon  Any Other Special Instructions Will Be Listed Below (If Applicable).  Steps to Quit Smoking Smoking tobacco can be harmful to your health and can affect almost every organ in your body. Smoking puts you, and those around you, at risk for developing many serious chronic diseases. Quitting smoking is difficult, but it is one of the best things that you can do for your health. It is never too late to quit. What are the benefits of quitting smoking? When you quit smoking, you lower your risk of developing serious diseases and conditions, such as:  Lung cancer or lung disease, such as COPD.  Heart disease.  Stroke.  Heart attack.  Infertility.  Osteoporosis and bone fractures.  Additionally, symptoms such as coughing, wheezing, and shortness of breath may get better when you quit. You may also find that you get sick less often because your body is stronger at fighting off colds and infections. If you are pregnant, quitting smoking can help to reduce your chances of having a baby of low birth weight. How do I get ready to quit? When you decide to quit smoking, create a plan to make sure that you are  successful. Before you quit:  Pick a date to quit. Set a date within the next two weeks to give you time to prepare.  Write down the reasons why you are quitting. Keep this list in places where you will see it often, such as on your bathroom mirror or in your car or wallet.  Identify the people, places, things, and activities that make you want to smoke (triggers) and avoid them. Make sure to take these actions: ? Throw away all cigarettes at home, at work, and in your car. ? Throw away smoking accessories, such as Scientist, research (medical). ? Clean your car and make sure to empty the ashtray. ? Clean your home, including curtains and carpets.  Tell your family, friends, and coworkers that you are quitting. Support from your loved ones can make quitting easier.  Talk with your health care provider about your options for quitting smoking.  Find out what treatment options are covered by your health insurance.  What strategies can I use to quit smoking? Talk with your healthcare provider about different strategies to quit smoking. Some strategies include:  Quitting smoking altogether instead of gradually lessening how much you smoke over a period of time. Research shows that quitting "cold [place]" is more successful than gradually quitting.  Attending in-person counseling to help you build problem-solving skills. You are more likely to have success in quitting if you attend several counseling sessions. Even short sessions of 10 minutes can be effective.  Finding  resources and support systems that can help you to quit smoking and remain smoke-free after you quit. These resources are most helpful when you use them often. They can include: ? Online chats with a Social worker. ? Telephone quitlines. ? Careers information officer. ? Support groups or group counseling. ? Text messaging programs. ? Mobile phone applications.  Taking medicines to help you quit smoking. (If you are pregnant or  breastfeeding, talk with your health care provider first.) Some medicines contain nicotine and some do not. Both types of medicines help with cravings, but the medicines that include nicotine help to relieve withdrawal symptoms. Your health care provider may recommend: ? Nicotine patches, gum, or lozenges. ? Nicotine inhalers or sprays. ? Non-nicotine medicine that is taken by mouth.  Talk with your health care provider about combining strategies, such as taking medicines while you are also receiving in-person counseling. Using these two strategies together makes you more likely to succeed in quitting than if you used either strategy on its own. If you are pregnant or breastfeeding, talk with your health care provider about finding counseling or other support strategies to quit smoking. Do not take medicine to help you quit smoking unless told to do so by your health care provider. What things can I do to make it easier to quit? Quitting smoking might feel overwhelming at first, but there is a lot that you can do to make it easier. Take these important actions:  Reach out to your family and friends and ask that they support and encourage you during this time. Call telephone quitlines, reach out to support groups, or work with a counselor for support.  Ask people who smoke to avoid smoking around you.  Avoid places that trigger you to smoke, such as bars, parties, or smoke-break areas at work.  Spend time around people who do not smoke.  Lessen stress in your life, because stress can be a smoking trigger for some people. To lessen stress, try: ? Exercising regularly. ? Deep-breathing exercises. ? Yoga. ? Meditating. ? Performing a body scan. This involves closing your eyes, scanning your body from head to toe, and noticing which parts of your body are particularly tense. Purposefully relax the muscles in those areas.  Download or purchase mobile phone or tablet apps (applications) that can  help you stick to your quit plan by providing reminders, tips, and encouragement. There are many free apps, such as QuitGuide from the [employer] Office manager for Disease Control and Prevention). You can find other support for quitting smoking (smoking cessation) through smokefree.gov and other websites.  How will I feel when I quit smoking? Within the first 24 hours of quitting smoking, you may start to feel some withdrawal symptoms. These symptoms are usually most noticeable 2-3 days after quitting, but they usually do not last beyond 2-3 weeks. Changes or symptoms that you might experience include:  Mood swings.  Restlessness, anxiety, or irritation.  Difficulty concentrating.  Dizziness.  Strong cravings for sugary foods in addition to nicotine.  Mild weight gain.  Constipation.  Nausea.  Coughing or a sore throat.  Changes in how your medicines work in your body.  A depressed mood.  Difficulty sleeping (insomnia).  After the first 2-3 weeks of quitting, you may start to notice more positive results, such as:  Improved sense of smell and taste.  Decreased coughing and sore throat.  Slower heart rate.  Lower blood pressure.  Clearer skin.  The ability to breathe more easily.  Fewer sick  days.  Quitting smoking is very challenging for most people. Do not get discouraged if you are not successful the first time. Some people need to make many attempts to quit before they achieve long-term success. Do your best to stick to your quit plan, and talk with your health care provider if you have any questions or concerns. This information is not intended to replace advice given to you by your health care provider. Make sure you discuss any questions you have with your health care provider. Document Released: 05/25/2001 Document Revised: 01/27/2016 Document Reviewed: 10/15/2014 Elsevier Interactive Patient Education  [employer].

## 2018-03-21 NOTE — Progress Notes (Signed)
Cardiology Office Note   Date:  03/21/2018   ID:  David Cowan, DOB [birth year], MRN [id number]  PCP:  [name], MD  Cardiologist:  [name]  Chief Complaint  Patient presents with  . Follow-up    abnormal LEA      History of Present Illness: David Cowan is a 57 y.o. male who was referred by Dr. Deborra Medina for evaluation management of peripheral arterial disease.  He has known history of coronary artery disease status post remote non-ST elevation myocardial infarction, hyperlipidemia, tobacco use, hypertension and arthritis. Over the last few months, he started having significant right calf discomfort after walking about 100 feet.  The pain usually subsides with resting and sometimes he has to rest for a few minutes before he can resume walking.  No significant discomfort on the left side.  No rest pain or lower extremity ulceration.  He does have occasional tingling in both feet.  He smokes 1 pack/day and has been doing so since he was a teenager.  He is not diabetic.  Family history is remarkable for coronary artery disease. He underwent noninvasive vascular evaluation which showed an ABI of 0.54 on the right and 0.72 on the left.  Duplex showed occluded right popliteal artery with one-vessel runoff below the knee.  On the left, there was significant proximal SFA stenosis. He denies any chest pain.  He has chronic dyspnea related to tobacco use.     Past Medical History:  Diagnosis Date  . Arthritis   . Bronchitis   . CAD (coronary artery disease)   . Dyslipidemia   . Hand pain   . Hard of hearing    left ear  . History of MI (myocardial infarction)   . Hyperlipidemia   . Hypertension   . Myocardial infarction ([place]) 2000  . Right flank pain     Past Surgical History:  Procedure Laterality Date  . CARDIAC CATHETERIZATION    . 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 aspirin 325 MG tablet Take 325 mg by mouth daily.    . cetirizine (ZYRTEC) 10 MG tablet Take 10 mg by mouth daily as needed for allergies.    Marland Kitchen enalapril (VASOTEC) 10 MG tablet TAKE 1 TABLET BY MOUTH  DAILY 90 tablet 3  . fenofibrate (TRICOR) 145 MG tablet TAKE 1 TABLET BY MOUTH  DAILY 90 tablet 0  . fluticasone (FLONASE) 50 MCG/ACT nasal spray Place 2 sprays into both nostrils as needed for allergies or rhinitis. 16 g 3  . meloxicam (MOBIC) 15 MG tablet Take 1 tablet (15 mg total) by mouth daily as needed for pain. 90 tablet 0  . metoprolol succinate (TOPROL-XL) 100 MG 24 hr tablet TAKE 1 TABLET BY MOUTH AT  BEDTIME. TAKE WITH OR  IMMEDIATELY FOLLOWING A  MEAL. 90 tablet 3  . nitroGLYCERIN (NITROSTAT) 0.4 MG SL tablet Place 1 tablet (0.4 mg total) under the tongue every 5 (five) minutes as needed for chest pain. 25 tablet 3  . simvastatin (ZOCOR) 40 MG tablet TAKE 1 TABLET BY MOUTH DAILY AT 6 PM. 90 tablet 2  . triamterene-hydrochlorothiazide (MAXZIDE-25) 37.5-25 MG tablet Take 1 tablet by mouth daily. 90 tablet 2   No current facility-administered medications for  this visit.     Allergies:   Amoxicillin and Penicillins    Social History:  The patient  reports that he has been smoking cigarettes. He has been smoking about 1.00 pack per day. He has never used smokeless tobacco. He reports that he drinks alcohol. He reports that he has current or past drug history. Drug: Marijuana.   Family History:  The patient's family history includes Congestive Heart Failure (age of onset: 13) in his mother; Coronary artery disease in his unknown relative; Factor V Leiden deficiency in his sister; Heart attack (age of onset: 29) in his father; Heart attack (age of onset: 47) in his unknown relative; Lung cancer in his mother.    ROS:  Please see the history of present  illness.   Otherwise, review of systems are positive for none.   All other systems are reviewed and negative.    PHYSICAL EXAM: VS:  BP 128/86   Pulse 83   Ht 5\' 10"  (1.778 m)   Wt 251 lb 3.2 oz (113.9 kg)   BMI 36.04 kg/m  , BMI Body mass index is 36.04 kg/m. GEN: Well nourished, well developed, in no acute distress  HEENT: normal  Neck: no JVD, carotid bruits, or masses Cardiac: RRR; no murmurs, rubs, or gallops,no edema  Respiratory:  clear to auscultation bilaterally, normal work of breathing GI: soft, nontender, nondistended, + BS MS: no deformity or atrophy  Skin: warm and dry, no rash Neuro:  Strength and sensation are intact Psych: euthymic mood, full affect Vascular: Radial pulses normal bilaterally.  Femoral pulses normal bilaterally.  Distal pulses are not palpable.  EKG:  EKG is ordered today. The ekg ordered today demonstrates normal sinus rhythm with no significant ST or T wave changes.   Recent Labs: 07/13/2017: ALT 17; BUN 27; Creatinine, Ser 1.40; Potassium 4.6; Sodium 136; TSH 2.030    Lipid Panel    Component Value Date/Time   CHOL 163 07/13/2017 0958   TRIG 152 (H) 07/13/2017 0958   HDL 39 (L) 07/13/2017 0958   CHOLHDL 4.2 07/13/2017 0958   CHOLHDL 6.8 (H) 05/17/2016 1037   VLDL 49 (H) 05/17/2016 1037   LDLCALC 94 07/13/2017 0958   LDLDIRECT 95.7 01/05/2013 0946      Wt Readings from Last 3 Encounters:  03/21/18 251 lb 3.2 oz (113.9 kg)  02/22/18 254 lb 6.4 oz (115.4 kg)  07/13/17 247 lb 12.8 oz (112.4 kg)       No flowsheet data found.    ASSESSMENT AND PLAN:  1.  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  I discussed with him the natural history and management of peripheral arterial disease and claudication.  I discussed the importance of aggressive treatment of his risk factors especially quitting tobacco  use. I recommend decreasing aspirin to 81 mg once daily.  I discussed different management options of claudication including an attempted walking program and revascularization by endovascular or surgical means. I recommend starting a walking program and this was discussed with him.  I am also adding cilostazol 50 mg twice daily. If symptoms worsen or do not improve after 3 months, we can proceed with angiography and possible endovascular intervention.  2.  Tobacco use: I discussed with him the importance of smoking cessation.  3.  Hyperlipidemia: Given the presence of peripheral arterial disease, I recommend a target LDL below 70.  Most  recent LDL was 94.  I switch simvastatin to atorvastatin 40 mg daily.  Repeat labs in 6 weeks.  4.  Essential hypertension: Blood pressures controlled on current medications.    Disposition:   FU with me in 3 months  Signed,  Kathlyn Sacramento, MD  03/21/2018 9:04 AM    [place]

## 2018-03-28 ENCOUNTER — Ambulatory Visit: Payer: BLUE CROSS/BLUE SHIELD | Admitting: Cardiovascular Disease

## 2018-04-06 ENCOUNTER — Encounter: Payer: BLUE CROSS/BLUE SHIELD | Admitting: Vascular Surgery

## 2018-04-06 ENCOUNTER — Encounter

## 2018-04-19 ENCOUNTER — Other Ambulatory Visit: Payer: Self-pay | Admitting: Cardiovascular Disease

## 2018-04-19 NOTE — Telephone Encounter (Signed)
Rx request sent to pharmacy.  

## 2018-04-19 NOTE — Telephone Encounter (Signed)
Please review for refill. Thanks!  

## 2018-04-23 ENCOUNTER — Other Ambulatory Visit: Payer: Self-pay

## 2018-04-24 MED ORDER — CILOSTAZOL 50 MG PO TABS: 50.0000 mg | ORAL_TABLET | Freq: Two times a day (BID) | ORAL | 1 refills | Status: DC

## 2018-04-24 MED ORDER — ATORVASTATIN CALCIUM 40 MG PO TABS: 40.0000 mg | ORAL_TABLET | Freq: Every day | ORAL | 1 refills | Status: DC

## 2018-05-02 ENCOUNTER — Other Ambulatory Visit: Payer: Self-pay | Admitting: Internal Medicine

## 2018-05-03 ENCOUNTER — Other Ambulatory Visit: Payer: Self-pay | Admitting: Internal Medicine

## 2018-05-03 DIAGNOSIS — I10 Essential (primary) hypertension: Secondary | ICD-10-CM

## 2018-05-03 DIAGNOSIS — E785 Hyperlipidemia, unspecified: Secondary | ICD-10-CM

## 2018-05-03 DIAGNOSIS — I2510 Atherosclerotic heart disease of native coronary artery without angina pectoris: Secondary | ICD-10-CM

## 2018-05-10 DIAGNOSIS — E785 Hyperlipidemia, unspecified: Secondary | ICD-10-CM | POA: Diagnosis not present

## 2018-05-10 LAB — LIPID PANEL
Chol/HDL Ratio: 6.1 ratio — ABNORMAL HIGH (ref 0.0–5.0)
Cholesterol, Total: 172 mg/dL (ref 100–199)
HDL: 28 mg/dL — AB (ref >39)
LDL Calculated: 93 mg/dL (ref 0–99)
TRIGLYCERIDES: 253 mg/dL — AB (ref 0–149)
VLDL Cholesterol Cal: 51 mg/dL — ABNORMAL HIGH (ref 5–40)

## 2018-05-10 LAB — HEPATIC FUNCTION PANEL
ALK PHOS: 41 IU/L (ref 39–117)
ALT: 17 IU/L (ref 0–44)
AST: 22 IU/L (ref 0–40)
Albumin: 3.5 g/dL (ref 3.5–5.5)
BILIRUBIN, DIRECT: 0.12 mg/dL (ref 0.00–0.40)
Bilirubin Total: 0.4 mg/dL (ref 0.0–1.2)
Total Protein: 5.8 g/dL — ABNORMAL LOW (ref 6.0–8.5)

## 2018-05-16 ENCOUNTER — Telehealth: Payer: Self-pay | Admitting: *Deleted

## 2018-05-16 DIAGNOSIS — E785 Hyperlipidemia, unspecified: Secondary | ICD-10-CM

## 2018-05-16 NOTE — Telephone Encounter (Signed)
Patient made aware of results and verbalized understanding.  

## 2018-05-16 NOTE — Telephone Encounter (Signed)
-----   Message from Wellington Hampshire, MD sent at 05/11/2018  9:25 AM EST ----- Inform patient that labs were normal. Cholesterol continues to be above target and he also has elevated triglyceride.  I recommend referring him to the lipid clinic at [facility].  We really need to be aggressive with this given that he has coronary artery disease and peripheral arterial disease.

## 2018-05-18 ENCOUNTER — Other Ambulatory Visit: Payer: Self-pay | Admitting: Internal Medicine

## 2018-05-19 NOTE — Telephone Encounter (Signed)
Left a message for the patient to call back.  The patient will need to be seen by the Lipid clinic (pharmacists) at [facility].

## 2018-05-19 NOTE — Telephone Encounter (Signed)
Please advise on refill request. This is not listed on patients current med list. Thanks, MI

## 2018-05-24 NOTE — Telephone Encounter (Signed)
Referral has been placed for the Lipid clinic

## 2018-06-23 ENCOUNTER — Other Ambulatory Visit: Payer: Self-pay | Admitting: Internal Medicine

## 2018-06-23 DIAGNOSIS — E785 Hyperlipidemia, unspecified: Secondary | ICD-10-CM

## 2018-06-23 DIAGNOSIS — I10 Essential (primary) hypertension: Secondary | ICD-10-CM

## 2018-06-23 DIAGNOSIS — I2510 Atherosclerotic heart disease of native coronary artery without angina pectoris: Secondary | ICD-10-CM

## 2018-06-23 MED ORDER — ENALAPRIL MALEATE 10 MG PO TABS: 10.0000 mg | ORAL_TABLET | Freq: Every day | ORAL | 0 refills | Status: DC

## 2018-06-23 NOTE — Telephone Encounter (Signed)
Pt's medication was sent to pt's pharmacy as requested. Confirmation received.  °

## 2018-06-27 ENCOUNTER — Ambulatory Visit: Payer: BLUE CROSS/BLUE SHIELD | Admitting: Cardiovascular Disease

## 2018-06-27 ENCOUNTER — Other Ambulatory Visit: Payer: Self-pay

## 2018-06-27 DIAGNOSIS — E785 Hyperlipidemia, unspecified: Secondary | ICD-10-CM

## 2018-06-27 DIAGNOSIS — I2510 Atherosclerotic heart disease of native coronary artery without angina pectoris: Secondary | ICD-10-CM

## 2018-06-27 DIAGNOSIS — I10 Essential (primary) hypertension: Secondary | ICD-10-CM

## 2018-06-27 MED ORDER — NITROGLYCERIN 0.4 MG SL SUBL: 0.4000 mg | SUBLINGUAL_TABLET | SUBLINGUAL | 3 refills | Status: DC | PRN

## 2018-06-27 NOTE — Telephone Encounter (Signed)
Refilled ntg per fax request

## 2018-06-29 ENCOUNTER — Other Ambulatory Visit: Payer: Self-pay

## 2018-06-29 DIAGNOSIS — I2510 Atherosclerotic heart disease of native coronary artery without angina pectoris: Secondary | ICD-10-CM

## 2018-06-29 DIAGNOSIS — I10 Essential (primary) hypertension: Secondary | ICD-10-CM

## 2018-06-29 DIAGNOSIS — E785 Hyperlipidemia, unspecified: Secondary | ICD-10-CM

## 2018-06-29 MED ORDER — NITROGLYCERIN 0.4 MG SL SUBL: 0.4000 mg | SUBLINGUAL_TABLET | SUBLINGUAL | 3 refills | Status: DC | PRN

## 2018-07-07 ENCOUNTER — Encounter: Payer: Self-pay | Admitting: Internal Medicine

## 2018-07-19 ENCOUNTER — Telehealth: Payer: Self-pay | Admitting: Cardiovascular Disease

## 2018-07-19 NOTE — Telephone Encounter (Signed)
Called patient, he will contact his [facility] on when he received his last flu shot. If it was 2018 advised patient he can get a current flu shot. Patient will notify his PCP office if he needs it.

## 2018-07-19 NOTE — Telephone Encounter (Signed)
New Message   Patient wants to know if he has to wait until April to get Flu shot, since on My chart that's the date showing.

## 2018-07-25 ENCOUNTER — Ambulatory Visit: Payer: 59 | Admitting: Cardiovascular Disease

## 2018-07-28 ENCOUNTER — Encounter: Payer: Self-pay | Admitting: Internal Medicine

## 2018-07-28 ENCOUNTER — Ambulatory Visit (INDEPENDENT_AMBULATORY_CARE_PROVIDER_SITE_OTHER): Payer: 59 | Admitting: Internal Medicine

## 2018-07-28 VITALS — BP 142/86 | HR 85 | Ht 70.0 in | Wt 256.0 lb

## 2018-07-28 DIAGNOSIS — E785 Hyperlipidemia, unspecified: Secondary | ICD-10-CM | POA: Diagnosis not present

## 2018-07-28 DIAGNOSIS — I2510 Atherosclerotic heart disease of native coronary artery without angina pectoris: Secondary | ICD-10-CM | POA: Diagnosis not present

## 2018-07-28 DIAGNOSIS — I10 Essential (primary) hypertension: Secondary | ICD-10-CM | POA: Diagnosis not present

## 2018-07-28 DIAGNOSIS — I252 Old myocardial infarction: Secondary | ICD-10-CM | POA: Diagnosis not present

## 2018-07-28 LAB — HEPATIC FUNCTION PANEL
ALK PHOS: 53 IU/L (ref 39–117)
ALT: 13 IU/L (ref 0–44)
AST: 15 IU/L (ref 0–40)
Albumin: 3.7 g/dL — ABNORMAL LOW (ref 3.8–4.9)
Bilirubin Total: 0.3 mg/dL (ref 0.0–1.2)
Bilirubin, Direct: 0.09 mg/dL (ref 0.00–0.40)
TOTAL PROTEIN: 6 g/dL (ref 6.0–8.5)

## 2018-07-28 LAB — CBC WITH DIFFERENTIAL/PLATELET
BASOS ABS: 0.1 10*3/uL (ref 0.0–0.2)
Basos: 1 %
EOS (ABSOLUTE): 0.4 10*3/uL (ref 0.0–0.4)
EOS: 4 %
HEMATOCRIT: 41.8 % (ref 37.5–51.0)
Hemoglobin: 14.1 g/dL (ref 13.0–17.7)
IMMATURE GRANULOCYTES: 1 %
Immature Grans (Abs): 0.1 10*3/uL (ref 0.0–0.1)
LYMPHS ABS: 2.7 10*3/uL (ref 0.7–3.1)
Lymphs: 28 %
MCH: 29.2 pg (ref 26.6–33.0)
MCHC: 33.7 g/dL (ref 31.5–35.7)
MCV: 87 fL (ref 79–97)
MONOS ABS: 0.9 10*3/uL (ref 0.1–0.9)
Monocytes: 9 %
NEUTROS PCT: 57 %
Neutrophils Absolute: 5.5 10*3/uL (ref 1.4–7.0)
PLATELETS: 281 10*3/uL (ref 150–450)
RBC: 4.83 x10E6/uL (ref 4.14–5.80)
RDW: 13.8 % (ref 11.6–15.4)
WBC: 9.7 10*3/uL (ref 3.4–10.8)

## 2018-07-28 LAB — BASIC METABOLIC PANEL
BUN/Creatinine Ratio: 13 (ref 9–20)
BUN: 15 mg/dL (ref 6–24)
CHLORIDE: 103 mmol/L (ref 96–106)
CO2: 23 mmol/L (ref 20–29)
CREATININE: 1.14 mg/dL (ref 0.76–1.27)
Calcium: 9 mg/dL (ref 8.7–10.2)
GFR calc Af Amer: 82 mL/min/{1.73_m2} (ref >59)
GFR calc non Af Amer: 71 mL/min/{1.73_m2} (ref >59)
GLUCOSE: 109 mg/dL — AB (ref 65–99)
Potassium: 4.5 mmol/L (ref 3.5–5.2)
SODIUM: 142 mmol/L (ref 134–144)

## 2018-07-28 LAB — LIPID PANEL
CHOL/HDL RATIO: 5 ratio (ref 0.0–5.0)
Cholesterol, Total: 179 mg/dL (ref 100–199)
HDL: 36 mg/dL — AB (ref >39)
LDL Calculated: 95 mg/dL (ref 0–99)
TRIGLYCERIDES: 241 mg/dL — AB (ref 0–149)
VLDL CHOLESTEROL CAL: 48 mg/dL — AB (ref 5–40)

## 2018-07-28 NOTE — Patient Instructions (Addendum)
Medication Instructions:  Your physician recommends that you continue on your current medications as directed. Please refer to the Current Medication list given to you today.  Labwork: You will get lab work today:  Lipids, liver panel, BMP and CBC  Testing/Procedures: None ordered.  Follow-Up: Your physician wants you to follow-up in: one year with [name].   You will receive a reminder letter in the mail two months in advance. If you don't receive a letter, please call our office to schedule the follow-up appointment.  Any Other Special Instructions Will Be Listed Below (If Applicable).  If you need a refill on your cardiac medications before your next appointment, please call your pharmacy.

## 2018-07-28 NOTE — Progress Notes (Signed)
HPI Mr. Vroom returns today for followup. He is a pleasant 58 yo man with CAD, s/p remoate NSTEMI, dyslipidemia, HTN, and arthritis. He has undergone left hip replacement in the past. He is back to work with no limitation. He denies anginal symptoms despite his physically demanding job. He has had claudication and was seen by Dr. Fletcher Anon and found to have bilateral peripheral vascular disease.  Allergies  Allergen Reactions  . Amoxicillin Rash  . Penicillins Rash     Current Outpatient Medications  Medication Sig Dispense Refill  . aspirin 81 MG tablet Take 1 tablet (81 mg total) by mouth daily. 90 tablet 3  . atorvastatin (LIPITOR) 40 MG tablet Take 1 tablet (40 mg total) by mouth daily. 90 tablet 1  . cetirizine (ZYRTEC) 10 MG tablet Take 10 mg by mouth daily as needed for allergies.    . cilostazol (PLETAL) 50 MG tablet Take 1 tablet (50 mg total) by mouth 2 (two) times daily. 180 tablet 1  . enalapril (VASOTEC) 10 MG tablet Take 1 tablet (10 mg total) by mouth daily. Please keep upcoming appt with [name] in February for future refills. Thank you 90 tablet 0  . fenofibrate (TRICOR) 145 MG tablet TAKE 1 TABLET BY MOUTH  DAILY 90 tablet 0  . fluticasone (FLONASE) 50 MCG/ACT nasal spray Place 2 sprays into both nostrils as needed for allergies or rhinitis. 16 g 3  . meloxicam (MOBIC) 15 MG tablet Take 1 tablet (15 mg total) by mouth daily as needed for pain. 90 tablet 0  . metoprolol succinate (TOPROL-XL) 100 MG 24 hr tablet TAKE 1 TABLET BY MOUTH AT  BEDTIME. TAKE WITH OR  IMMEDIATELY FOLLOWING A  MEAL. 90 tablet 3  . nitroGLYCERIN (NITROSTAT) 0.4 MG SL tablet Place 1 tablet (0.4 mg total) under the tongue every 5 (five) minutes as needed for chest pain. 25 tablet 3  . triamterene-hydrochlorothiazide (MAXZIDE-25) 37.5-25 MG tablet Take 1 tablet by mouth daily. 90 tablet 2   No current facility-administered medications for this visit.      Past Medical History:  Diagnosis Date   . Arthritis   . Bronchitis   . CAD (coronary artery disease)   . Dyslipidemia   . Hand pain   . Hard of hearing    left ear  . History of MI (myocardial infarction)   . Hyperlipidemia   . Hypertension   . Myocardial infarction ([place]) 2000  . Right flank pain     ROS:   All systems reviewed and negative except as noted in the HPI.   Past Surgical History:  Procedure Laterality Date  . CARDIAC CATHETERIZATION    . TOTAL HIP ARTHROPLASTY Left 12/10/2014   Procedure: LEFT TOTAL HIP ARTHROPLASTY ANTERIOR APPROACH;  Surgeon: Paralee Cancel, MD;  Location: [facility];  Service: Orthopedics;  Laterality: Left;  Marland Kitchen VASECTOMY       Family History  Problem Relation Age of Onset  . Heart attack Father 26  . Coronary artery disease Other         fam hx, early  . Heart attack Other 40       uncle  . Factor V Leiden deficiency Sister   . Congestive Heart Failure Mother 52  . Lung cancer Mother   . Colon cancer Neg Hx      Social History   Socioeconomic History  . Marital status: Married    Spouse name: Not on file  . Number of children:  2  . Years of education: Not on file  . Highest education level: Not on file  Occupational History  . Occupation: SERVICE MANAGER    Employer: [employer]  . Financial resource strain: Not on file  . Food insecurity:    Worry: Not on file    Inability: Not on file  . Transportation needs:    Medical: Not on file    Non-medical: Not on file  Tobacco Use  . Smoking status: Current Every Day Smoker    Packs/day: 1.00    Types: Cigarettes  . Smokeless tobacco: Never Used  Substance and Sexual Activity  . Alcohol use: Yes    Alcohol/week: 0.0 standard drinks    Comment: socailly  . Drug use: Yes    Types: Marijuana    Comment: occ use  . Sexual activity: Not on file  Lifestyle  . Physical activity:    Days per week: Not on file    Minutes per session: Not on file  . Stress: Not on file  Relationships  .  Social connections:    Talks on phone: Not on file    Gets together: Not on file    Attends religious service: Not on file    Active member of club or organization: Not on file    Attends meetings of clubs or organizations: Not on file    Relationship status: Not on file  . Intimate partner violence:    Fear of current or ex partner: Not on file    Emotionally abused: Not on file    Physically abused: Not on file    Forced sexual activity: Not on file  Other Topics Concern  . Not on file  Social History Narrative   2 daughters     BP (!) 142/86   Pulse 85   Ht 5\' 10"  (1.778 m)   Wt 256 lb (116.1 kg)   SpO2 95%   BMI 36.73 kg/m   Physical Exam:  Well appearing NAD HEENT: Unremarkable Neck:  No JVD, no thyromegally Lymphatics:  No adenopathy Back:  No CVA tenderness Lungs:  Clear with no wheezes HEART:  Regular rate rhythm, no murmurs, no rubs, no clicks Abd:  soft, positive bowel sounds, no organomegally, no rebound, no guarding Ext:  2 plus pulses, no edema, no cyanosis, no clubbing Skin:  No rashes no nodules Neuro:  CN II through XII intact, motor grossly intact   Assess/Plan: 1. CAD - he is s/p remote MI. I have strongly encouraged the patient to lose weight, stop smoking and exercise. 2. Peripheral vascular disease - he is s/p evaluation by Dr. Gaylyn Cheers. He is pending followup. He still has claudication and not much improvement with medical therapy. No limb ulcers at this point.  3. Dyslipidemia - he will continue his lipitor. 4. Tobacco abuse - I have discussed with him in detail. He is still smoking almost a pack a day, more when he drinks ETOH. I have asked him to stop.   Mikle Bosworth.D.

## 2018-08-03 ENCOUNTER — Encounter: Payer: Self-pay | Admitting: Family Medicine

## 2018-08-03 ENCOUNTER — Ambulatory Visit (INDEPENDENT_AMBULATORY_CARE_PROVIDER_SITE_OTHER): Payer: 59 | Admitting: Family Medicine

## 2018-08-03 VITALS — BP 138/76 | HR 85 | Temp 98.3°F | Ht 70.0 in | Wt 255.0 lb

## 2018-08-03 DIAGNOSIS — L0291 Cutaneous abscess, unspecified: Secondary | ICD-10-CM

## 2018-08-03 MED ORDER — DOXYCYCLINE HYCLATE 100 MG PO TABS: 100.0000 mg | ORAL_TABLET | Freq: Two times a day (BID) | ORAL | 0 refills | Status: DC

## 2018-08-03 NOTE — Patient Instructions (Signed)
Start antibiotics and apply warm compresses over the next couple of days.    Skin Abscess  A skin abscess is an infected area of your skin that contains pus and other material. An abscess can happen in any part of your body. Some abscesses break open (rupture) on their own. Most continue to get worse unless they are treated. The infection can spread deeper into the body and into your blood, which can make you feel sick. A skin abscess is caused by germs that enter the skin through a cut or scrape. It can also be caused by blocked oil and sweat glands or infected hair follicles. This condition is usually treated by:  Draining the pus.  Taking antibiotic medicines.  Placing a warm, wet washcloth over the abscess. Follow these instructions at home: Medicines   Take over-the-counter and prescription medicines only as told by your doctor.  If you were prescribed an antibiotic medicine, take it as told by your doctor. Do not stop taking the antibiotic even if you start to feel better. Abscess care   If you have an abscess that has not drained, place a warm, clean, wet washcloth over the abscess several times a day. Do this as told by your doctor.  Follow instructions from your doctor about how to take care of your abscess. Make sure you: ? Cover the abscess with a bandage (dressing). ? Change your bandage or gauze as told by your doctor. ? Wash your hands with soap and water before you change the bandage or gauze. If you cannot use soap and water, use hand sanitizer.  Check your abscess every day for signs that the infection is getting worse. Check for: ? More redness, swelling, or pain. ? More fluid or blood. ? Warmth. ? More pus or a bad smell. General instructions  To avoid spreading the infection: ? Do not share personal care items, towels, or hot tubs with others. ? Avoid making skin-to-skin contact with other people.  Keep all follow-up visits as told by your doctor. This is  important. Contact a doctor if:  You have more redness, swelling, or pain around your abscess.  You have more fluid or blood coming from your abscess.  Your abscess feels warm when you touch it.  You have more pus or a bad smell coming from your abscess.  You have a fever.  Your muscles ache.  You have chills.  You feel sick. Get help right away if:  You have very bad (severe) pain.  You see red streaks on your skin spreading away from the abscess. Summary  A skin abscess is an infected area of your skin that contains pus and other material.  The abscess is caused by germs that enter the skin through a cut or scrape. It can also be caused by blocked oil and sweat glands or infected hair follicles.  Follow your doctor's instructions on caring for your abscess, taking medicines, preventing infections, and keeping follow-up visits. This information is not intended to replace advice given to you by your health care provider. Make sure you discuss any questions you have with your health care provider. Document Released: 11/17/2007 Document Revised: 07/14/2017 Document Reviewed: 07/14/2017 Elsevier Interactive Patient Education  2019 [employer].

## 2018-08-03 NOTE — Assessment & Plan Note (Signed)
-  Spontaneously draining and expressed a large amount from abscess today without I&D.   -Wound culture collected, will cover empirically with doxycycline.  -Recommend warm compress to encourage continued drainage.

## 2018-08-03 NOTE — Progress Notes (Signed)
MORRELL FLUKE - 58 y.o. male MRN [id number]  Date of birth: Jan 28, 1961  Subjective Chief Complaint  Patient presents with  . Cyst    Left Inguinal-present for two days. Denies fevers. Started draining last night    HPI KAELON WEEKES is a 58 y.o. male here today with complaint of abscess to L groin area.  Reports that area has been present for several days.  Area began draining some last night.  Still quite painful.  He denies fever, chills, testicular pain.  He has tried applying some neosporin to area.    ROS:  A comprehensive ROS was completed and negative except as noted per HPI  Allergies  Allergen Reactions  . Amoxicillin Rash  . Penicillins Rash    Past Medical History:  Diagnosis Date  . Arthritis   . Bronchitis   . CAD (coronary artery disease)   . Dyslipidemia   . Hand pain   . Hard of hearing    left ear  . History of MI (myocardial infarction)   . Hyperlipidemia   . Hypertension   . Myocardial infarction ([place]) 2000  . Right flank pain     Past Surgical History:  Procedure Laterality Date  . CARDIAC CATHETERIZATION    . TOTAL HIP ARTHROPLASTY Left 12/10/2014   Procedure: LEFT TOTAL HIP ARTHROPLASTY ANTERIOR APPROACH;  Surgeon: Paralee Cancel, MD;  Location: [facility];  Service: Orthopedics;  Laterality: Left;  Marland Kitchen VASECTOMY      Social History   Socioeconomic History  . Marital status: Married    Spouse name: Not on file  . Number of children: 2  . Years of education: Not on file  . Highest education level: Not on file  Occupational History  . Occupation: SERVICE MANAGER    Employer: [employer]  . Financial resource strain: Not on file  . Food insecurity:    Worry: Not on file    Inability: Not on file  . Transportation needs:    Medical: Not on file    Non-medical: Not on file  Tobacco Use  . Smoking status: Current Every Day Smoker    Packs/day: 1.00    Types: Cigarettes  . Smokeless tobacco: Never Used  Substance and  Sexual Activity  . Alcohol use: Yes    Alcohol/week: 0.0 standard drinks    Comment: socailly  . Drug use: Yes    Types: Marijuana    Comment: occ use  . Sexual activity: Not on file  Lifestyle  . Physical activity:    Days per week: Not on file    Minutes per session: Not on file  . Stress: Not on file  Relationships  . Social connections:    Talks on phone: Not on file    Gets together: Not on file    Attends religious service: Not on file    Active member of club or organization: Not on file    Attends meetings of clubs or organizations: Not on file    Relationship status: Not on file  Other Topics Concern  . Not on file  Social History Narrative   2 daughters    Family History  Problem Relation Age of Onset  . Heart attack Father 20  . Coronary artery disease Other         fam hx, early  . Heart attack Other 40       uncle  . Factor V Leiden deficiency Sister   . Congestive Heart  Failure Mother 26  . Lung cancer Mother   . Colon cancer Neg Hx     Health Maintenance  Topic Date Due  . INFLUENZA VACCINE  10/12/2018 (Originally 01/12/2018)  . Hepatitis C Screening  02/23/2019 (Originally [birth year])  . HIV Screening  02/23/2019 (Originally 10/20/1975)  . COLONOSCOPY  05/31/2024  . TETANUS/TDAP  11/25/2027    ----------------------------------------------------------------------------------------------------------------------------------------------------------------------------------------------------------------- Physical Exam BP 138/76   Pulse 85   Temp 98.3 F (36.8 C) (Oral)   Ht 5\' 10"  (1.778 m)   Wt 255 lb (115.7 kg)   SpO2 94%   BMI 36.59 kg/m   Physical Exam Constitutional:      Appearance: Normal appearance.  HENT:     Head: Normocephalic and atraumatic.  Cardiovascular:     Rate and Rhythm: Normal rate and regular rhythm.  Pulmonary:     Effort: Pulmonary effort is normal.     Breath sounds: Normal breath sounds.  Skin:    Comments: Abscess  to left scrotal area.  Spontaneous drainage present.  With pressure I was able to express a large amount of bloody/purulent material.  Swab sent for culture.    Neurological:     Mental Status: He is alert.     ------------------------------------------------------------------------------------------------------------------------------------------------------------------------------------------------------------------- Assessment and Plan  Abscess -Spontaneously draining and expressed a large amount from abscess today without I&D.   -Wound culture collected, will cover empirically with doxycycline.  -Recommend warm compress to encourage continued drainage.

## 2018-08-06 LAB — WOUND CULTURE
MICRO NUMBER: 221202
RESULT: NO GROWTH
SPECIMEN QUALITY: ADEQUATE

## 2018-08-08 ENCOUNTER — Ambulatory Visit: Payer: 59 | Admitting: Cardiovascular Disease

## 2018-08-08 NOTE — Progress Notes (Signed)
Wound culture without growth.  Complete current antibiotic.  Please check to see if he is feeling better.

## 2018-09-13 ENCOUNTER — Telehealth: Payer: Self-pay | Admitting: *Deleted

## 2018-09-13 NOTE — Telephone Encounter (Signed)
Call placed to the patient to schedule a web visit. He declined at this point and has rescheduled for June 30th. He will talk to his wife and call back if he wants to try a web visit.   The patient stated that the Cilostazol has helped with his legs. He will call back sooner if he feels like he needs to be seen or wants to do the web visit.       David Cowan

## 2018-09-19 ENCOUNTER — Ambulatory Visit: Payer: 59 | Admitting: Cardiovascular Disease

## 2018-09-20 ENCOUNTER — Other Ambulatory Visit: Payer: Self-pay | Admitting: Cardiovascular Disease

## 2018-09-20 MED ORDER — CILOSTAZOL 50 MG PO TABS: 50.0000 mg | ORAL_TABLET | Freq: Two times a day (BID) | ORAL | 1 refills | Status: DC

## 2018-10-01 ENCOUNTER — Other Ambulatory Visit: Payer: Self-pay | Admitting: Internal Medicine

## 2018-10-01 DIAGNOSIS — I10 Essential (primary) hypertension: Secondary | ICD-10-CM

## 2018-10-01 DIAGNOSIS — E785 Hyperlipidemia, unspecified: Secondary | ICD-10-CM

## 2018-10-01 DIAGNOSIS — I2510 Atherosclerotic heart disease of native coronary artery without angina pectoris: Secondary | ICD-10-CM

## 2018-11-02 ENCOUNTER — Other Ambulatory Visit: Payer: Self-pay | Admitting: Internal Medicine

## 2018-11-02 DIAGNOSIS — E785 Hyperlipidemia, unspecified: Secondary | ICD-10-CM

## 2018-11-02 DIAGNOSIS — I10 Essential (primary) hypertension: Secondary | ICD-10-CM

## 2018-11-02 DIAGNOSIS — I2510 Atherosclerotic heart disease of native coronary artery without angina pectoris: Secondary | ICD-10-CM

## 2018-11-02 MED ORDER — METOPROLOL SUCCINATE ER 100 MG PO TB24: ORAL_TABLET | ORAL | 3 refills | Status: DC

## 2018-12-12 ENCOUNTER — Ambulatory Visit: Payer: 59 | Admitting: Cardiovascular Disease

## 2018-12-18 ENCOUNTER — Other Ambulatory Visit: Payer: Self-pay

## 2018-12-18 MED ORDER — ATORVASTATIN CALCIUM 40 MG PO TABS: 40.0000 mg | ORAL_TABLET | Freq: Every day | ORAL | 3 refills | Status: DC

## 2018-12-25 ENCOUNTER — Other Ambulatory Visit: Payer: Self-pay

## 2018-12-25 DIAGNOSIS — E785 Hyperlipidemia, unspecified: Secondary | ICD-10-CM

## 2018-12-25 DIAGNOSIS — I10 Essential (primary) hypertension: Secondary | ICD-10-CM

## 2018-12-25 DIAGNOSIS — I2510 Atherosclerotic heart disease of native coronary artery without angina pectoris: Secondary | ICD-10-CM

## 2018-12-25 MED ORDER — ENALAPRIL MALEATE 10 MG PO TABS: 10.0000 mg | ORAL_TABLET | Freq: Every day | ORAL | 1 refills | Status: DC

## 2019-02-20 ENCOUNTER — Ambulatory Visit: Payer: 59 | Admitting: Cardiovascular Disease

## 2019-03-18 ENCOUNTER — Encounter: Payer: Self-pay | Admitting: Family Medicine

## 2019-03-18 ENCOUNTER — Other Ambulatory Visit: Payer: Self-pay | Admitting: Cardiovascular Disease

## 2019-04-17 ENCOUNTER — Telehealth: Payer: Self-pay | Admitting: Cardiovascular Disease

## 2019-04-17 ENCOUNTER — Other Ambulatory Visit: Payer: Self-pay | Admitting: Cardiology

## 2019-04-17 ENCOUNTER — Ambulatory Visit (INDEPENDENT_AMBULATORY_CARE_PROVIDER_SITE_OTHER): Payer: 59 | Admitting: Cardiovascular Disease

## 2019-04-17 ENCOUNTER — Encounter: Payer: Self-pay | Admitting: Cardiovascular Disease

## 2019-04-17 ENCOUNTER — Other Ambulatory Visit: Payer: Self-pay

## 2019-04-17 VITALS — BP 135/77 | HR 91 | Temp 97.1°F | Ht 70.0 in | Wt 256.0 lb

## 2019-04-17 DIAGNOSIS — Z720 Tobacco use: Secondary | ICD-10-CM

## 2019-04-17 DIAGNOSIS — I739 Peripheral vascular disease, unspecified: Secondary | ICD-10-CM | POA: Diagnosis not present

## 2019-04-17 DIAGNOSIS — I10 Essential (primary) hypertension: Secondary | ICD-10-CM | POA: Diagnosis not present

## 2019-04-17 DIAGNOSIS — I2510 Atherosclerotic heart disease of native coronary artery without angina pectoris: Secondary | ICD-10-CM

## 2019-04-17 DIAGNOSIS — E785 Hyperlipidemia, unspecified: Secondary | ICD-10-CM | POA: Diagnosis not present

## 2019-04-17 MED ORDER — ROSUVASTATIN CALCIUM 40 MG PO TABS: 40.0000 mg | ORAL_TABLET | Freq: Every day | ORAL | 1 refills | Status: DC

## 2019-04-17 NOTE — Telephone Encounter (Signed)
The patient was switched from Atorvastatin to Rosuvastatin at his office visit today with Dr. Fletcher Anon. He called to say that he has roughly 4 months of the Atorvastatin at home and would like to know if he can take them first before starting the Rosuvastatin (thus delaying repeat labs until he has been on the Rosuvastatin at least 2-3 months)

## 2019-04-17 NOTE — Progress Notes (Signed)
Cardiology Office Note   Date:  04/17/2019   ID:  Mckinney, Poplar [birth year], MRN [id number]  PCP:  [name], MD  Cardiologist:  [name]  No chief complaint on file.     History of Present Illness: David Cowan is a 58 y.o. male who is here today for follow-up visit regarding peripheral arterial disease.     He has known history of coronary artery disease status post remote non-ST elevation myocardial infarction, hyperlipidemia, tobacco use, hypertension and arthritis. He was seen last year for severe right calf claudication.He underwent noninvasive vascular evaluation which showed an ABI of 0.54 on the right and 0.72 on the left.  Duplex showed occluded right popliteal artery with one-vessel runoff below the knee.  On the left, there was significant proximal SFA stenosis.  He was started on cilostazol.  He reports overall stable symptoms of right calf discomfort that happens after walking about 100 feet.  We switch his simvastatin to atorvastatin last year but his LDL remains above 70.  He denies any chest pain or shortness of breath.     Past Medical History:  Diagnosis Date  . Arthritis   . Bronchitis   . CAD (coronary artery disease)   . Dyslipidemia   . Hand pain   . Hard of hearing    left ear  . History of MI (myocardial infarction)   . Hyperlipidemia   . Hypertension   . Myocardial infarction ([place]) 2000  . Right flank pain     Past Surgical History:  Procedure Laterality Date  . CARDIAC CATHETERIZATION    . TOTAL HIP ARTHROPLASTY Left 12/10/2014   Procedure: LEFT TOTAL HIP ARTHROPLASTY ANTERIOR APPROACH;  Surgeon: Paralee Cancel, MD;  Location: [facility];  Service: Orthopedics;  Laterality: Left;  Marland Kitchen VASECTOMY       Current Outpatient Medications  Medication Sig Dispense Refill  . aspirin 81 MG tablet Take 1 tablet (81 mg total) by mouth daily. 90 tablet 3  . atorvastatin (LIPITOR) 40 MG tablet Take 1 tablet (40 mg total) by mouth daily. 90 tablet  3  . cetirizine (ZYRTEC) 10 MG tablet Take 10 mg by mouth daily as needed for allergies.    . cilostazol (PLETAL) 50 MG tablet TAKE 1 TABLET BY MOUTH TWICE A DAY 180 tablet 0  . enalapril (VASOTEC) 10 MG tablet Take 1 tablet (10 mg total) by mouth daily. 90 tablet 1  . fenofibrate (TRICOR) 145 MG tablet TAKE 1 TABLET BY MOUTH  DAILY 90 tablet 0  . fluticasone (FLONASE) 50 MCG/ACT nasal spray Place 2 sprays into both nostrils as needed for allergies or rhinitis. 16 g 3  . meloxicam (MOBIC) 15 MG tablet Take 1 tablet (15 mg total) by mouth daily as needed for pain. 90 tablet 0  . metoprolol succinate (TOPROL-XL) 100 MG 24 hr tablet TAKE 1 TABLET BY MOUTH AT  BEDTIME. TAKE WITH OR  IMMEDIATELY FOLLOWING A  MEAL. 90 tablet 3  . nitroGLYCERIN (NITROSTAT) 0.4 MG SL tablet Place 1 tablet (0.4 mg total) under the tongue every 5 (five) minutes as needed for chest pain. 25 tablet 3  . triamterene-hydrochlorothiazide (MAXZIDE-25) 37.5-25 MG tablet TAKE 1 TABLET BY MOUTH EVERY DAY 90 tablet 3  . doxycycline (VIBRA-TABS) 100 MG tablet Take 1 tablet (100 mg total) by mouth 2 (two) times daily. (Patient not taking: Reported on 04/17/2019) 20 tablet 0   No current facility-administered medications for this visit.  Allergies:   Amoxicillin and Penicillins    Social History:  The patient  reports that he has been smoking cigarettes. He has been smoking about 1.00 pack per day. He has never used smokeless tobacco. He reports current alcohol use. He reports current drug use. Drug: Marijuana.   Family History:  The patient's family history includes Congestive Heart Failure (age of onset: 64) in his mother; Coronary artery disease in an other family member; Factor V Leiden deficiency in his sister; Heart attack (age of onset: 30) in his father; Heart attack (age of onset: 51) in an other family member; Lung cancer in his mother.    ROS:  Please see the history of present illness.   Otherwise, review of systems  are positive for none.   All other systems are reviewed and negative.    PHYSICAL EXAM: VS:  BP 135/77   Pulse 91   Temp (!) 97.1 F (36.2 C)   Ht 5\' 10"  (1.778 m)   Wt 256 lb (116.1 kg)   SpO2 94%   BMI 36.73 kg/m  , BMI Body mass index is 36.73 kg/m. GEN: Well nourished, well developed, in no acute distress  HEENT: normal  Neck: no JVD, carotid bruits, or masses Cardiac: RRR; no murmurs, rubs, or gallops,no edema  Respiratory:  clear to auscultation bilaterally, normal work of breathing GI: soft, nontender, nondistended, + BS MS: no deformity or atrophy  Skin: warm and dry, no rash Neuro:  Strength and sensation are intact Psych: euthymic mood, full affect Vascular: Radial pulses normal bilaterally.  Femoral pulses normal bilaterally.  Distal pulses are not palpable.  EKG:  EKG is ordered today. The ekg ordered today demonstrates normal sinus rhythm with no significant ST or T wave changes.   Recent Labs: 07/28/2018: ALT 13; BUN 15; Creatinine, Ser 1.14; Hemoglobin 14.1; Platelets 281; Potassium 4.5; Sodium 142    Lipid Panel    Component Value Date/Time   CHOL 179 07/28/2018 0952   TRIG 241 (H) 07/28/2018 0952   HDL 36 (L) 07/28/2018 0952   CHOLHDL 5.0 07/28/2018 0952   CHOLHDL 6.8 (H) 05/17/2016 1037   VLDL 49 (H) 05/17/2016 1037   LDLCALC 95 07/28/2018 0952   LDLDIRECT 95.7 01/05/2013 0946      Wt Readings from Last 3 Encounters:  04/17/19 256 lb (116.1 kg)  08/03/18 255 lb (115.7 kg)  07/28/18 256 lb (116.1 kg)       No flowsheet data found.    ASSESSMENT AND PLAN:  1.  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  He has no evidence of critical limb ischemia.  In spite of claudication, he does not feel that this is lifestyle limiting.  He wants to continue medical therapy for now.  Continue cilostazol.  2.  Tobacco use: He  cut down on tobacco use but has not quit completely.  I offered to give him Chantix but he wants to try on his own.  3.  Mixed hyperlipidemia: Most recent lipid profile showed an LDL in the 90s and triglycerides slightly above 200 in spite of atorvastatin and fenofibrate.  I elected to switch atorvastatin to rosuvastatin 40 mg daily.  I also discussed with him the importance of healthy diet and decreasing carbohydrate and fat intake.  Repeat labs in 3 months.  4.  Essential hypertension: Blood pressures controlled on current medications.  Disposition:   FU with me in 6 months  Signed,  Kathlyn Sacramento, MD  04/17/2019 10:28 AM    [place]

## 2019-04-17 NOTE — Telephone Encounter (Signed)
Patient wanted to discuss changes to his medication that Dr. Fletcher Anon made at his office visit today.

## 2019-04-17 NOTE — Telephone Encounter (Signed)
Rx has been sent to the pharmacy electronically. ° °

## 2019-04-17 NOTE — Patient Instructions (Signed)
Medication Instructions:  STOP the Atorvastatin START Rosuvastatin 40 mg once daily  *If you need a refill on your cardiac medications before your next appointment, please call your pharmacy*  Lab Work: Your provider would like for you to return in 3 months to have the following labs drawn: Fasting lipid and liver. You do not need an appointment for the lab. Once in our office lobby there is a podium where you can sign in and ring the doorbell to alert [place] that you are here. The lab is open from 8:00 am to 4:30 pm; closed for lunch from 12:45pm-1:45pm.  If you have labs (blood work) drawn today and your tests are completely normal, you will receive your results only by: Marland Kitchen MyChart Message (if you have MyChart) OR . A paper copy in the mail If you have any lab test that is abnormal or we need to change your treatment, we will call you to review the results.  Testing/Procedures: None ordered  Follow-Up: At [facility], you and your health needs are our priority.  As part of our continuing mission to provide you with exceptional heart care, we have created designated Provider Care Teams.  These Care Teams include your primary Cardiologist (physician) and Advanced Practice Providers (APPs -  Physician Assistants and Nurse Practitioners) who all work together to provide you with the care you need, when you need it.  Your next appointment:   6 months  The format for your next appointment:   In Person  Provider:   Kathlyn Sacramento, MD

## 2019-04-17 NOTE — Telephone Encounter (Signed)
Patient made aware and verbalized his understanding.  

## 2019-04-17 NOTE — Telephone Encounter (Signed)
That is fine 

## 2019-06-17 ENCOUNTER — Other Ambulatory Visit: Payer: Self-pay | Admitting: Internal Medicine

## 2019-06-17 DIAGNOSIS — I10 Essential (primary) hypertension: Secondary | ICD-10-CM

## 2019-06-17 DIAGNOSIS — E785 Hyperlipidemia, unspecified: Secondary | ICD-10-CM

## 2019-06-17 DIAGNOSIS — I2510 Atherosclerotic heart disease of native coronary artery without angina pectoris: Secondary | ICD-10-CM

## 2019-06-26 ENCOUNTER — Other Ambulatory Visit: Payer: Self-pay | Admitting: Cardiovascular Disease

## 2019-06-27 NOTE — Telephone Encounter (Signed)
Please review for refill.  

## 2019-06-29 NOTE — Telephone Encounter (Signed)
What is the reason that I flagged this refill?  It seems to be straightforward and it should be refilled for 6 to 12 months given that he has been keeping his follow-ups?

## 2019-08-05 ENCOUNTER — Ambulatory Visit: Payer: 59 | Attending: Internal Medicine

## 2019-08-05 DIAGNOSIS — Z23 Encounter for immunization: Secondary | ICD-10-CM | POA: Insufficient documentation

## 2019-08-05 NOTE — Progress Notes (Signed)
   Covid-19 Vaccination Clinic  Name:  JEM EASTBURN    MRN: [id number] DOB: 1961/05/17  08/05/2019  Mr. Borsuk was observed post Covid-19 immunization for 15 minutes without incidence. He was provided with Vaccine Information Sheet and instruction to access the V-Safe system.   Mr. Pule was instructed to call 911 with any severe reactions post vaccine: Marland Kitchen Difficulty breathing  . Swelling of your face and throat  . A fast heartbeat  . A bad rash all over your body  . Dizziness and weakness    Immunizations Administered    Name Date Dose VIS Date Route   Pfizer COVID-19 Vaccine 08/05/2019 12:10 PM 0.3 mL 05/25/2019 Intramuscular   Manufacturer: [place]   Lot: [id number]   [place]: [id number]

## 2019-08-17 NOTE — Telephone Encounter (Signed)
Follow Up  Patient is calling in to see if he needs to follow up in May even though he has not started taking the Rosuvastatin. Patient states that he continued to take the atorvastatin because he had 3 months left. Will start taking Rosuvastatin next month. Please advise.

## 2019-08-17 NOTE — Telephone Encounter (Signed)
Spoke with patient. Patient has not started his Rosuvastatin yet, will start next month. Patient advised to follow up in May per last AVS. Patient scheduled for a 6 month follow up on 10/30/2019.

## 2019-08-29 ENCOUNTER — Ambulatory Visit: Payer: 59 | Attending: Internal Medicine

## 2019-08-29 DIAGNOSIS — Z23 Encounter for immunization: Secondary | ICD-10-CM

## 2019-08-29 NOTE — Progress Notes (Signed)
   Covid-19 Vaccination Clinic  Name:  David Cowan    MRN: [id number] DOB: [birth year]  08/29/2019  [name] was observed post Covid-19 immunization for 15 minutes without incident. He was provided with Vaccine Information Sheet and instruction to access the V-Safe system.   [name] was instructed to call 911 with any severe reactions post vaccine: Marland Kitchen Difficulty breathing  . Swelling of face and throat  . A fast heartbeat  . A bad rash all over body  . Dizziness and weakness   Immunizations Administered    Name Date Dose VIS Date Route   Pfizer COVID-19 Vaccine 08/29/2019  8:22 AM 0.3 mL 05/25/2019 Intramuscular   Manufacturer: [place]   Lot: [id number]   [place]: [id number]

## 2019-09-04 ENCOUNTER — Other Ambulatory Visit: Payer: Self-pay | Admitting: Cardiovascular Disease

## 2019-09-04 DIAGNOSIS — E785 Hyperlipidemia, unspecified: Secondary | ICD-10-CM

## 2019-09-04 DIAGNOSIS — I10 Essential (primary) hypertension: Secondary | ICD-10-CM

## 2019-09-04 DIAGNOSIS — I2510 Atherosclerotic heart disease of native coronary artery without angina pectoris: Secondary | ICD-10-CM

## 2019-09-04 NOTE — Telephone Encounter (Signed)
Refill Request.  

## 2019-09-05 NOTE — Telephone Encounter (Signed)
Yes. That's fine.  

## 2019-09-05 NOTE — Telephone Encounter (Signed)
Spoke with [facility]. They stated that the Fenofibrate 160 mg would be $15 for the patient, verses the Fenofibrate (Tricor) 145 mg. They are requesting a change.

## 2019-09-05 NOTE — Telephone Encounter (Signed)
Prescription has been sent in with changes.

## 2019-09-05 NOTE — Telephone Encounter (Signed)
Patient requesting alternative to fenofribrate due to cost. Suggested pharmacy alternatives are: fenofibrate 160 MG tablet or fenofibrate micronized (LOFIBRA) 200 MG capsule. Will route to MD for review.

## 2019-09-05 NOTE — Telephone Encounter (Signed)
Once daily

## 2019-09-24 ENCOUNTER — Other Ambulatory Visit: Payer: Self-pay | Admitting: Internal Medicine

## 2019-09-24 DIAGNOSIS — I10 Essential (primary) hypertension: Secondary | ICD-10-CM

## 2019-09-24 DIAGNOSIS — E785 Hyperlipidemia, unspecified: Secondary | ICD-10-CM

## 2019-09-24 DIAGNOSIS — I2510 Atherosclerotic heart disease of native coronary artery without angina pectoris: Secondary | ICD-10-CM

## 2019-09-25 NOTE — Telephone Encounter (Signed)
This is a [place] pt 

## 2019-09-25 NOTE — Telephone Encounter (Signed)
Please review for refill, NL patient.

## 2019-09-28 ENCOUNTER — Ambulatory Visit (INDEPENDENT_AMBULATORY_CARE_PROVIDER_SITE_OTHER): Payer: 59 | Admitting: Internal Medicine

## 2019-09-28 ENCOUNTER — Other Ambulatory Visit: Payer: Self-pay

## 2019-09-28 ENCOUNTER — Encounter: Payer: Self-pay | Admitting: Internal Medicine

## 2019-09-28 VITALS — BP 126/84 | HR 91 | Ht 71.0 in | Wt 257.0 lb

## 2019-09-28 DIAGNOSIS — I739 Peripheral vascular disease, unspecified: Secondary | ICD-10-CM | POA: Diagnosis not present

## 2019-09-28 DIAGNOSIS — I10 Essential (primary) hypertension: Secondary | ICD-10-CM

## 2019-09-28 DIAGNOSIS — I2510 Atherosclerotic heart disease of native coronary artery without angina pectoris: Secondary | ICD-10-CM

## 2019-09-28 NOTE — Progress Notes (Signed)
HPI [name] returns today for followup. He is a pleasant 59 yo man with CAD, s/p remote NSTEMI, dyslipidemia, HTN, and arthritis. He has undergone left hip replacement several years ago. He is back to work with no limitation. He denies anginal symptoms despite his physically demanding job. He has had claudication and was seen by Dr. Fletcher Anon and found to have bilateral peripheral vascular disease. He is still smoking.  Allergies  Allergen Reactions  . Amoxicillin Rash  . Penicillins Rash     Current Outpatient Medications  Medication Sig Dispense Refill  . aspirin 81 MG tablet Take 1 tablet (81 mg total) by mouth daily. 90 tablet 3  . cetirizine (ZYRTEC) 10 MG tablet Take 10 mg by mouth daily as needed for allergies.    . cilostazol (PLETAL) 50 MG tablet TAKE 1 TABLET BY MOUTH TWICE A DAY 180 tablet 2  . enalapril (VASOTEC) 10 MG tablet TAKE 1 TABLET BY MOUTH EVERY DAY 90 tablet 3  . fenofibrate 160 MG tablet Take 1 tablet (160 mg total) by mouth daily. 90 tablet 0  . fluticasone (FLONASE) 50 MCG/ACT nasal spray Place 2 sprays into both nostrils as needed for allergies or rhinitis. 16 g 3  . meloxicam (MOBIC) 15 MG tablet Take 1 tablet (15 mg total) by mouth daily as needed for pain. 90 tablet 0  . metoprolol succinate (TOPROL-XL) 100 MG 24 hr tablet TAKE 1 TABLET BY MOUTH AT  BEDTIME. TAKE WITH OR  IMMEDIATELY FOLLOWING A  MEAL. 90 tablet 3  . nitroGLYCERIN (NITROSTAT) 0.4 MG SL tablet Place 1 tablet (0.4 mg total) under the tongue every 5 (five) minutes as needed for chest pain. 25 tablet 3  . triamterene-hydrochlorothiazide (MAXZIDE-25) 37.5-25 MG tablet TAKE 1 TABLET BY MOUTH EVERY DAY 90 tablet 0  . rosuvastatin (CRESTOR) 40 MG tablet Take 1 tablet (40 mg total) by mouth daily. 90 tablet 1   No current facility-administered medications for this visit.     Past Medical History:  Diagnosis Date  . Arthritis   . Bronchitis   . CAD (coronary artery disease)   . Dyslipidemia    . Hand pain   . Hard of hearing    left ear  . History of MI (myocardial infarction)   . Hyperlipidemia   . Hypertension   . Myocardial infarction ([place]) 2000  . Right flank pain     ROS:   All systems reviewed and negative except as noted in the HPI.   Past Surgical History:  Procedure Laterality Date  . CARDIAC CATHETERIZATION    . TOTAL HIP ARTHROPLASTY Left 12/10/2014   Procedure: LEFT TOTAL HIP ARTHROPLASTY ANTERIOR APPROACH;  Surgeon: Paralee Cancel, MD;  Location: [facility];  Service: Orthopedics;  Laterality: Left;  Marland Kitchen VASECTOMY       Family History  Problem Relation Age of Onset  . Heart attack Father 59  . Coronary artery disease Other         fam hx, early  . Heart attack Other 40       uncle  . Factor V Leiden deficiency Sister   . Congestive Heart Failure Mother 41  . Lung cancer Mother   . Colon cancer Neg Hx      Social History   Socioeconomic History  . Marital status: Married    Spouse name: Not on file  . Number of children: 2  . Years of education: Not on file  . Highest education level: Not  on file  Occupational History  . Occupation: SERVICE MANAGER    Employer: [employer]  Tobacco Use  . Smoking status: Current Every Day Smoker    Packs/day: 1.00    Types: Cigarettes  . Smokeless tobacco: Never Used  Substance and Sexual Activity  . Alcohol use: Yes    Alcohol/week: 0.0 standard drinks    Comment: socailly  . Drug use: Yes    Types: Marijuana    Comment: occ use  . Sexual activity: Not on file  Other Topics Concern  . Not on file  Social History Narrative   2 daughters   Social Determinants of Health   Financial Resource Strain:   . Difficulty of Paying Living Expenses:   Food Insecurity:   . Worried About Charity fundraiser in the Last Year:   . Arboriculturist in the Last Year:   Transportation Needs:   . Film/video editor (Medical):   Marland Kitchen Lack of Transportation (Non-Medical):   Physical Activity:   . Days  of Exercise per Week:   . Minutes of Exercise per Session:   Stress:   . Feeling of Stress :   Social Connections:   . Frequency of Communication with Friends and Family:   . Frequency of Social Gatherings with Friends and Family:   . Attends Religious Services:   . Active Member of Clubs or Organizations:   . Attends Archivist Meetings:   Marland Kitchen Marital Status:   Intimate Partner Violence:   . Fear of Current or Ex-Partner:   . Emotionally Abused:   Marland Kitchen Physically Abused:   . Sexually Abused:      BP 126/84   Pulse 91   Ht 5\' 11"  (1.803 m)   Wt 257 lb (116.6 kg)   SpO2 94%   BMI 35.84 kg/m   Physical Exam:  Well appearing NAD HEENT: Unremarkable Neck:  No JVD, no thyromegally Lymphatics:  No adenopathy Back:  No CVA tenderness Lungs:  Clear HEART:  Regular rate rhythm, no murmurs, no rubs, no clicks Abd:  soft, positive bowel sounds, no organomegally, no rebound, no guarding Ext:  2 plus pulses, no edema, no cyanosis, no clubbing Skin:  No rashes no nodules Neuro:  CN II through XII intact, motor grossly intact   Assess/Plan: 1. CAD - he denies anginal symptoms. He will continue his current meds. 2. Peripheral vascular disease - he is limited most by claudication, especially in his right leg. He is on maximal medical therapy. 3. Tobacco abuse - he is still smoking! I have strongly encouraged him to stop. 4. Obesity - he has gained a pound since his visit last year. He is encouraged to lose weight.  [email or web address] [email or web address].D.

## 2019-10-30 ENCOUNTER — Encounter: Payer: Self-pay | Admitting: Cardiovascular Disease

## 2019-10-30 ENCOUNTER — Other Ambulatory Visit: Payer: Self-pay

## 2019-10-30 ENCOUNTER — Ambulatory Visit (INDEPENDENT_AMBULATORY_CARE_PROVIDER_SITE_OTHER): Payer: 59 | Admitting: Cardiovascular Disease

## 2019-10-30 VITALS — BP 140/86 | HR 83 | Temp 97.1°F | Ht 70.5 in | Wt 250.7 lb

## 2019-10-30 DIAGNOSIS — I10 Essential (primary) hypertension: Secondary | ICD-10-CM

## 2019-10-30 DIAGNOSIS — I739 Peripheral vascular disease, unspecified: Secondary | ICD-10-CM

## 2019-10-30 DIAGNOSIS — Z720 Tobacco use: Secondary | ICD-10-CM

## 2019-10-30 DIAGNOSIS — E785 Hyperlipidemia, unspecified: Secondary | ICD-10-CM | POA: Diagnosis not present

## 2019-10-30 LAB — COMPREHENSIVE METABOLIC PANEL
ALT: 13 IU/L (ref 0–44)
AST: 18 IU/L (ref 0–40)
Albumin/Globulin Ratio: 1.6 (ref 1.2–2.2)
Albumin: 3.7 g/dL — ABNORMAL LOW (ref 3.8–4.9)
Alkaline Phosphatase: 54 IU/L (ref 48–121)
BUN/Creatinine Ratio: 22 — ABNORMAL HIGH (ref 9–20)
BUN: 26 mg/dL — ABNORMAL HIGH (ref 6–24)
Bilirubin Total: 0.3 mg/dL (ref 0.0–1.2)
CO2: 25 mmol/L (ref 20–29)
Calcium: 9.1 mg/dL (ref 8.7–10.2)
Chloride: 102 mmol/L (ref 96–106)
Creatinine, Ser: 1.16 mg/dL (ref 0.76–1.27)
GFR calc Af Amer: 79 mL/min/{1.73_m2} (ref >59)
GFR calc non Af Amer: 69 mL/min/{1.73_m2} (ref >59)
Globulin, Total: 2.3 g/dL (ref 1.5–4.5)
Glucose: 94 mg/dL (ref 65–99)
Potassium: 4.6 mmol/L (ref 3.5–5.2)
Sodium: 139 mmol/L (ref 134–144)
Total Protein: 6 g/dL (ref 6.0–8.5)

## 2019-10-30 LAB — LIPID PANEL
Chol/HDL Ratio: 4.4 ratio (ref 0.0–5.0)
Cholesterol, Total: 175 mg/dL (ref 100–199)
HDL: 40 mg/dL (ref >39)
LDL Chol Calc (NIH): 102 mg/dL — ABNORMAL HIGH (ref 0–99)
Triglycerides: 188 mg/dL — ABNORMAL HIGH (ref 0–149)
VLDL Cholesterol Cal: 33 mg/dL (ref 5–40)

## 2019-10-30 MED ORDER — CILOSTAZOL 100 MG PO TABS: 100.0000 mg | ORAL_TABLET | Freq: Two times a day (BID) | ORAL | 1 refills | Status: DC

## 2019-10-30 NOTE — Patient Instructions (Signed)
Medication Instructions:  INCREASE the Cilostazol (Pletal) to 100 mg twice daily  *If you need a refill on your cardiac medications before your next appointment, please call your pharmacy*   Lab Work: Your provider would like for you to have the following labs today: CMET and Lipid  If you have labs (blood work) drawn today and your tests are completely normal, you will receive your results only by: Marland Kitchen MyChart Message (if you have MyChart) OR . A paper copy in the mail If you have any lab test that is abnormal or we need to change your treatment, we will call you to review the results.   Testing/Procedures: None ordered   Follow-Up: At [facility], you and your health needs are our priority.  As part of our continuing mission to provide you with exceptional heart care, we have created designated Provider Care Teams.  These Care Teams include your primary Cardiologist (physician) and Advanced Practice Providers (APPs -  Physician Assistants and Nurse Practitioners) who all work together to provide you with the care you need, when you need it.  We recommend signing up for the patient portal called "MyChart".  Sign up information is provided on this After Visit Summary.  MyChart is used to connect with patients for Virtual Visits (Telemedicine).  Patients are able to view lab/test results, encounter notes, upcoming appointments, etc.  Non-urgent messages can be sent to your provider as well.   To learn more about what you can do with MyChart, go to [email or web address].    Your next appointment:   4 month(s)  The format for your next appointment:   In Person  Provider:   Kathlyn Sacramento, MD

## 2019-10-30 NOTE — Progress Notes (Signed)
Cardiology Office Note   Date:  10/30/2019   ID:  David Cowan, DOB [birth year], MRN [id number]  PCP:  [name], MD  Cardiologist:  [name]  No chief complaint on file.     History of Present Illness: David Cowan is a 59 y.o. male who is here today for follow-up visit regarding peripheral arterial disease.   He has known history of coronary artery disease status post remote non-ST elevation myocardial infarction, hyperlipidemia, tobacco use, hypertension and arthritis. He was seen in 2019 for severe right calf claudication.He underwent noninvasive vascular evaluation which showed an ABI of 0.54 on the right and 0.72 on the left.  Duplex showed occluded right popliteal artery with one-vessel runoff below the knee.  On the left, there was significant proximal SFA stenosis.  He was started on cilostazol.  During last visit, he was switched to high-dose rosuvastatin.  Initially his right calf claudication improved with cilostazol but there has been some worsening recently.  His job is very physical and frequently he feels limited by right calf claudication.  He has no rest pain or lower extremity ulceration.  He is trying to quit smoking completely but has not been successful.  No chest pain or significant dyspnea.     Past Medical History:  Diagnosis Date  . Arthritis   . Bronchitis   . CAD (coronary artery disease)   . Dyslipidemia   . Hand pain   . Hard of hearing    left ear  . History of MI (myocardial infarction)   . Hyperlipidemia   . Hypertension   . Myocardial infarction ([place]) 2000  . Right flank pain     Past Surgical History:  Procedure Laterality Date  . CARDIAC CATHETERIZATION    . TOTAL HIP ARTHROPLASTY Left 12/10/2014   Procedure: LEFT TOTAL HIP ARTHROPLASTY ANTERIOR APPROACH;  Surgeon: Paralee Cancel, MD;  Location: [facility];  Service: Orthopedics;  Laterality: Left;  Marland Kitchen VASECTOMY       Current Outpatient Medications  Medication Sig Dispense  Refill  . aspirin 81 MG tablet Take 1 tablet (81 mg total) by mouth daily. 90 tablet 3  . cetirizine (ZYRTEC) 10 MG tablet Take 10 mg by mouth daily as needed for allergies.    . cilostazol (PLETAL) 50 MG tablet TAKE 1 TABLET BY MOUTH TWICE A DAY 180 tablet 2  . enalapril (VASOTEC) 10 MG tablet TAKE 1 TABLET BY MOUTH EVERY DAY 90 tablet 3  . fenofibrate 160 MG tablet Take 1 tablet (160 mg total) by mouth daily. 90 tablet 0  . fluticasone (FLONASE) 50 MCG/ACT nasal spray Place 2 sprays into both nostrils as needed for allergies or rhinitis. 16 g 3  . meloxicam (MOBIC) 15 MG tablet Take 1 tablet (15 mg total) by mouth daily as needed for pain. 90 tablet 0  . metoprolol succinate (TOPROL-XL) 100 MG 24 hr tablet TAKE 1 TABLET BY MOUTH AT  BEDTIME. TAKE WITH OR  IMMEDIATELY FOLLOWING A  MEAL. 90 tablet 3  . nitroGLYCERIN (NITROSTAT) 0.4 MG SL tablet Place 1 tablet (0.4 mg total) under the tongue every 5 (five) minutes as needed for chest pain. 25 tablet 3  . triamterene-hydrochlorothiazide (MAXZIDE-25) 37.5-25 MG tablet TAKE 1 TABLET BY MOUTH EVERY DAY 90 tablet 0  . rosuvastatin (CRESTOR) 40 MG tablet Take 1 tablet (40 mg total) by mouth daily. 90 tablet 1   No current facility-administered medications for this visit.    Allergies:  Amoxicillin and Penicillins    Social History:  The patient  reports that he has been smoking cigarettes. He has been smoking about 1.00 pack per day. He has never used smokeless tobacco. He reports current alcohol use. He reports current drug use. Drug: Marijuana.   Family History:  The patient's family history includes Congestive Heart Failure (age of onset: 47) in his mother; Coronary artery disease in an other family member; Factor V Leiden deficiency in his sister; Heart attack (age of onset: 13) in his father; Heart attack (age of onset: 34) in an other family member; Lung cancer in his mother.    ROS:  Please see the history of present illness.   Otherwise,  review of systems are positive for none.   All other systems are reviewed and negative.    PHYSICAL EXAM: VS:  BP 140/86   Pulse 83   Temp (!) 97.1 F (36.2 C)   Ht 5' 10.5" (1.791 m)   Wt 250 lb 11.2 oz (113.7 kg)   SpO2 93%   BMI 35.46 kg/m  , BMI Body mass index is 35.46 kg/m. GEN: Well nourished, well developed, in no acute distress  HEENT: normal  Neck: no JVD, carotid bruits, or masses Cardiac: RRR; no murmurs, rubs, or gallops,no edema  Respiratory:  clear to auscultation bilaterally, normal work of breathing GI: soft, nontender, nondistended, + BS MS: no deformity or atrophy  Skin: warm and dry, no rash Neuro:  Strength and sensation are intact Psych: euthymic mood, full affect Vascular: Radial pulses normal bilaterally.  Femoral pulses normal bilaterally.  Distal pulses are not palpable.  EKG:  EKG is not ordered today.    Recent Labs: No results found for requested labs within last 8760 hours.    Lipid Panel    Component Value Date/Time   CHOL 179 07/28/2018 0952   TRIG 241 (H) 07/28/2018 0952   HDL 36 (L) 07/28/2018 0952   CHOLHDL 5.0 07/28/2018 0952   CHOLHDL 6.8 (H) 05/17/2016 1037   VLDL 49 (H) 05/17/2016 1037   LDLCALC 95 07/28/2018 0952   LDLDIRECT 95.7 01/05/2013 0946      Wt Readings from Last 3 Encounters:  10/30/19 250 lb 11.2 oz (113.7 kg)  09/28/19 257 lb (116.6 kg)  04/17/19 256 lb (116.1 kg)       No flowsheet data found.    ASSESSMENT AND PLAN:  1.  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  He has no evidence of critical limb ischemia.  The patient has worsening right calf claudication and I again discussed with him the option of proceeding with angiography and possible endovascular intervention.  I discussed the limitations related to revascularization of the popliteal artery in general with the  preference not to place a stent in that area.  The other option would be to increase his cilostazol and he wants to do that before considering invasive evaluation which I think is reasonable.  I increase cilostazol to 100 mg twice daily.  Continue aggressive medical therapy.  I will reevaluate him again in 4 months and if there is no significant improvement, the plan is to proceed with angiography at that time.  2.  Tobacco use: He cut down on tobacco use but has not quit completely.  I again discussed the importance of smoking cessation.  3.  Mixed hyperlipidemia: He has been taking rosuvastatin for  about a month now and he is also on fenofibrate.  I requested lipid profile today.  4.  Essential hypertension: Blood pressures reasonably controlled on current medications.  Check CMP    Disposition:   FU with me in 4 months  Signed,  Kathlyn Sacramento, MD  10/30/2019 8:50 AM    [place]

## 2019-11-07 ENCOUNTER — Other Ambulatory Visit: Payer: Self-pay | Admitting: Internal Medicine

## 2019-11-07 DIAGNOSIS — E785 Hyperlipidemia, unspecified: Secondary | ICD-10-CM

## 2019-11-07 DIAGNOSIS — I2510 Atherosclerotic heart disease of native coronary artery without angina pectoris: Secondary | ICD-10-CM

## 2019-11-07 DIAGNOSIS — I10 Essential (primary) hypertension: Secondary | ICD-10-CM

## 2019-12-06 ENCOUNTER — Other Ambulatory Visit: Payer: Self-pay | Admitting: Cardiovascular Disease

## 2019-12-06 DIAGNOSIS — I2510 Atherosclerotic heart disease of native coronary artery without angina pectoris: Secondary | ICD-10-CM

## 2019-12-06 DIAGNOSIS — E785 Hyperlipidemia, unspecified: Secondary | ICD-10-CM

## 2019-12-06 DIAGNOSIS — I10 Essential (primary) hypertension: Secondary | ICD-10-CM

## 2019-12-06 NOTE — Telephone Encounter (Signed)
Refill Request.  

## 2019-12-21 ENCOUNTER — Other Ambulatory Visit: Payer: Self-pay | Admitting: Internal Medicine

## 2019-12-21 DIAGNOSIS — E785 Hyperlipidemia, unspecified: Secondary | ICD-10-CM

## 2019-12-21 DIAGNOSIS — I10 Essential (primary) hypertension: Secondary | ICD-10-CM

## 2019-12-21 DIAGNOSIS — I2510 Atherosclerotic heart disease of native coronary artery without angina pectoris: Secondary | ICD-10-CM

## 2019-12-31 ENCOUNTER — Other Ambulatory Visit: Payer: Self-pay | Admitting: Cardiovascular Disease

## 2020-01-01 NOTE — Telephone Encounter (Signed)
Refill request

## 2020-01-02 NOTE — Telephone Encounter (Signed)
Refill request

## 2020-01-02 NOTE — Telephone Encounter (Signed)
Rx has been sent to the pharmacy electronically. ° °

## 2020-03-04 ENCOUNTER — Other Ambulatory Visit: Payer: Self-pay

## 2020-03-04 ENCOUNTER — Encounter: Payer: Self-pay | Admitting: Cardiovascular Disease

## 2020-03-04 ENCOUNTER — Ambulatory Visit (INDEPENDENT_AMBULATORY_CARE_PROVIDER_SITE_OTHER): Payer: 59 | Admitting: Cardiovascular Disease

## 2020-03-04 VITALS — BP 134/80 | HR 75 | Ht 70.5 in | Wt 254.2 lb

## 2020-03-04 DIAGNOSIS — E785 Hyperlipidemia, unspecified: Secondary | ICD-10-CM

## 2020-03-04 DIAGNOSIS — I10 Essential (primary) hypertension: Secondary | ICD-10-CM | POA: Diagnosis not present

## 2020-03-04 DIAGNOSIS — I739 Peripheral vascular disease, unspecified: Secondary | ICD-10-CM | POA: Diagnosis not present

## 2020-03-04 DIAGNOSIS — I2510 Atherosclerotic heart disease of native coronary artery without angina pectoris: Secondary | ICD-10-CM

## 2020-03-04 MED ORDER — CILOSTAZOL 100 MG PO TABS: 100.0000 mg | ORAL_TABLET | Freq: Two times a day (BID) | ORAL | 1 refills | Status: DC

## 2020-03-04 MED ORDER — NITROGLYCERIN 0.4 MG SL SUBL: 0.4000 mg | SUBLINGUAL_TABLET | SUBLINGUAL | 3 refills | Status: DC | PRN

## 2020-03-04 NOTE — Progress Notes (Signed)
Cardiology Office Note   Date:  03/04/2020   ID:  David Cowan, DOB 08-Aug-1960, MRN [id number]  PCP:  Patient, No Pcp Per  Cardiologist:  [name]  No chief complaint on file.     History of Present Illness: David Cowan is a 59 y.o. male who is here today for follow-up visit regarding peripheral arterial disease.   He has known history of coronary artery disease status post remote non-ST elevation myocardial infarction, hyperlipidemia, tobacco use, hypertension and arthritis. He was seen in 2019 for severe right calf claudication.He underwent noninvasive vascular evaluation which showed an ABI of 0.54 on the right and 0.72 on the left.  Duplex showed occluded right popliteal artery with one-vessel runoff below the knee.  On the left, there was significant proximal SFA stenosis.  He had some improvement with cilostazol but he complained of increased right calf claudication during last visit.  We increased the dose of cilostazol to 100 mg twice daily.  However, the pharmacy continue to refill the 50 mg twice a day and he has not increased the dose.  He reports that his symptoms are still the same.  No chest pain or shortness of breath.     Past Medical History:  Diagnosis Date  . Arthritis   . Bronchitis   . CAD (coronary artery disease)   . Dyslipidemia   . Hand pain   . Hard of hearing    left ear  . History of MI (myocardial infarction)   . Hyperlipidemia   . Hypertension   . Myocardial infarction ([place]) 2000  . Right flank pain     Past Surgical History:  Procedure Laterality Date  . CARDIAC CATHETERIZATION    . TOTAL HIP ARTHROPLASTY Left 12/10/2014   Procedure: LEFT TOTAL HIP ARTHROPLASTY ANTERIOR APPROACH;  Surgeon: Paralee Cancel, MD;  Location: [facility];  Service: Orthopedics;  Laterality: Left;  Marland Kitchen VASECTOMY       Current Outpatient Medications  Medication Sig Dispense Refill  . aspirin 81 MG tablet Take 1 tablet (81 mg total) by mouth daily. 90 tablet 3   . cetirizine (ZYRTEC) 10 MG tablet Take 10 mg by mouth daily as needed for allergies.    . cilostazol (PLETAL) 100 MG tablet Take 1 tablet (100 mg total) by mouth 2 (two) times daily. 180 tablet 1  . cilostazol (PLETAL) 50 MG tablet TAKE 1 TABLET BY MOUTH TWICE A DAY 180 tablet 2  . enalapril (VASOTEC) 10 MG tablet TAKE 1 TABLET BY MOUTH EVERY DAY 90 tablet 3  . fenofibrate 160 MG tablet TAKE 1 TABLET BY MOUTH EVERY DAY 90 tablet 3  . fluticasone (FLONASE) 50 MCG/ACT nasal spray Place 2 sprays into both nostrils as needed for allergies or rhinitis. 16 g 3  . meloxicam (MOBIC) 15 MG tablet Take 1 tablet (15 mg total) by mouth daily as needed for pain. 90 tablet 0  . metoprolol succinate (TOPROL-XL) 100 MG 24 hr tablet TAKE 1 TABLET BY MOUTH AT BEDTIME. TAKE WITH OR IMMEDIATELY FOLLOWING A MEAL. 90 tablet 3  . nitroGLYCERIN (NITROSTAT) 0.4 MG SL tablet Place 1 tablet (0.4 mg total) under the tongue every 5 (five) minutes as needed for chest pain. 25 tablet 3  . triamterene-hydrochlorothiazide (MAXZIDE-25) 37.5-25 MG tablet TAKE 1 TABLET BY MOUTH EVERY DAY 90 tablet 2  . rosuvastatin (CRESTOR) 40 MG tablet Take 1 tablet (40 mg total) by mouth daily. 90 tablet 1   No current facility-administered medications for  this visit.    Allergies:   Amoxicillin and Penicillins    Social History:  The patient  reports that he has been smoking cigarettes. He has been smoking about 1.00 pack per day. He has never used smokeless tobacco. He reports current alcohol use. He reports current drug use. Drug: Marijuana.   Family History:  The patient's family history includes Congestive Heart Failure (age of onset: 24) in his mother; Coronary artery disease in an other family member; Factor V Leiden deficiency in his sister; Heart attack (age of onset: 42) in his father; Heart attack (age of onset: 5) in an other family member; Lung cancer in his mother.    ROS:  Please see the history of present illness.    Otherwise, review of systems are positive for none.   All other systems are reviewed and negative.    PHYSICAL EXAM: VS:  BP 134/80   Pulse 75   Ht 5' 10.5" (1.791 m)   Wt 254 lb 3.2 oz (115.3 kg)   SpO2 91%   BMI 35.96 kg/m  , BMI Body mass index is 35.96 kg/m. GEN: Well nourished, well developed, in no acute distress  HEENT: normal  Neck: no JVD, carotid bruits, or masses Cardiac: RRR; no murmurs, rubs, or gallops,no edema  Respiratory:  clear to auscultation bilaterally, normal work of breathing GI: soft, nontender, nondistended, + BS MS: no deformity or atrophy  Skin: warm and dry, no rash Neuro:  Strength and sensation are intact Psych: euthymic mood, full affect Vascular: Radial pulses normal bilaterally.  Femoral pulses normal bilaterally.  Distal pulses are not palpable.  EKG:  EKG is ordered today. EKG showed normal sinus rhythm with possible old septal infarct.   Recent Labs: 10/30/2019: ALT 13; BUN 26; Creatinine, Ser 1.16; Potassium 4.6; Sodium 139    Lipid Panel    Component Value Date/Time   CHOL 175 10/30/2019 0912   TRIG 188 (H) 10/30/2019 0912   HDL 40 10/30/2019 0912   CHOLHDL 4.4 10/30/2019 0912   CHOLHDL 6.8 (H) 05/17/2016 1037   VLDL 49 (H) 05/17/2016 1037   LDLCALC 102 (H) 10/30/2019 0912   LDLDIRECT 95.7 01/05/2013 0946      Wt Readings from Last 3 Encounters:  03/04/20 254 lb 3.2 oz (115.3 kg)  10/30/19 250 lb 11.2 oz (113.7 kg)  09/28/19 257 lb (116.6 kg)       No flowsheet data found.    ASSESSMENT AND PLAN:  1.  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  He has no evidence of critical limb ischemia.  He reports that his symptoms are still the same.  I increased cilostazol to 100 mg twice daily .  Repeat lower extremity arterial Doppler in 6 months and if there is worsening symptoms, the plan is to proceed  with angiography and possible endovascular intervention.  2.  Tobacco use: He cut down on tobacco use but has not quit completely.  I again discussed the importance of smoking cessation.  3.  Mixed hyperlipidemia: He is on rosuvastatin and fenofibrate.  Most recent lipid profile showed improvement in triglyceride to 188 and LDL down to 102.  He might not have been on rosuvastatin long enough to see the full effect.  If his follow-up labs show LDL above 70, will consider adding Zetia.  4.  Essential hypertension: Blood pressures reasonably controlled on current medications.  Disposition:   FU with me in 6 months  Signed,  Kathlyn Sacramento, MD  03/04/2020 8:56 AM    [place]

## 2020-03-04 NOTE — Patient Instructions (Signed)
Medication Instructions:  No changes *If you need a refill on your cardiac medications before your next appointment, please call your pharmacy*   Lab Work: None ordered If you have labs (blood work) drawn today and your tests are completely normal, you will receive your results only by: Marland Kitchen MyChart Message (if you have MyChart) OR . A paper copy in the mail If you have any lab test that is abnormal or we need to change your treatment, we will call you to review the results.   Testing/Procedures: Your physician has requested that you have a lower extremity arterial duplex. During this test, ultrasound is used to evaluate arterial blood flow in the legs. Allow one hour for this exam. There are no restrictions or special instructions. This will take place at [place], [address].  Your physician has requested that you have an ankle brachial index (ABI). During this test an ultrasound and blood pressure cuff are used to evaluate the arteries that supply the arms and legs with blood. Allow thirty minutes for this exam. There are no restrictions or special instructions. This will take place at [place], [address].    Follow-Up: At [facility], you and your health needs are our priority.  As part of our continuing mission to provide you with exceptional heart care, we have created designated Provider Care Teams.  These Care Teams include your primary Cardiologist (physician) and Advanced Practice Providers (APPs -  Physician Assistants and Nurse Practitioners) who all work together to provide you with the care you need, when you need it.  We recommend signing up for the patient portal called "MyChart".  Sign up information is provided on this After Visit Summary.  MyChart is used to connect with patients for Virtual Visits (Telemedicine).  Patients are able to view lab/test results, encounter notes, upcoming appointments, etc.  Non-urgent messages can be sent to your provider as  well.   To learn more about what you can do with MyChart, go to [email or web address].    Your next appointment:   6 month(s) same day as the dopplers  The format for your next appointment:   In Person  Provider:   Kathlyn Sacramento, MD

## 2020-03-11 ENCOUNTER — Telehealth: Payer: Self-pay | Admitting: *Deleted

## 2020-03-11 NOTE — Telephone Encounter (Signed)
Spoke to pt today about the [place] [address]. Per pt request, will mail pt a copy of the consent to the address on file.  Will call me if he is interested in coming in for a screening visit.

## 2020-03-31 ENCOUNTER — Other Ambulatory Visit: Payer: Self-pay | Admitting: Cardiovascular Disease

## 2020-04-25 IMAGING — DX DG CERVICAL SPINE COMPLETE 4+V
6 series · 6 of 6 positions shown · non-contrast
Comparison: None.

CLINICAL DATA: Pain and normal is in the neck when turning towards
the right.

EXAM:
CERVICAL SPINE - COMPLETE 4+ VIEW

[cervical spine ap]
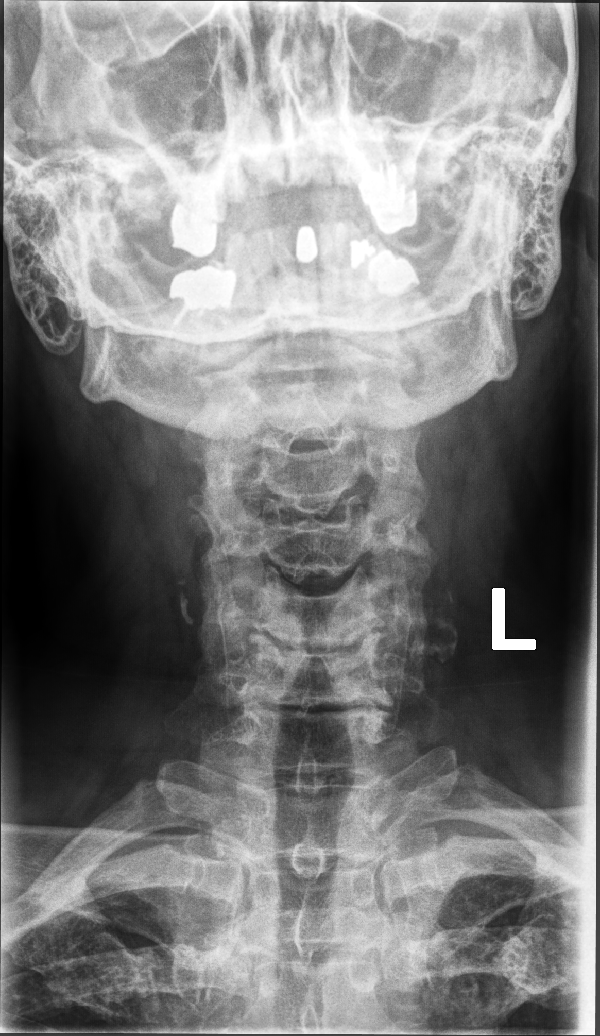

[cervical spine oblique (1 of 2)]
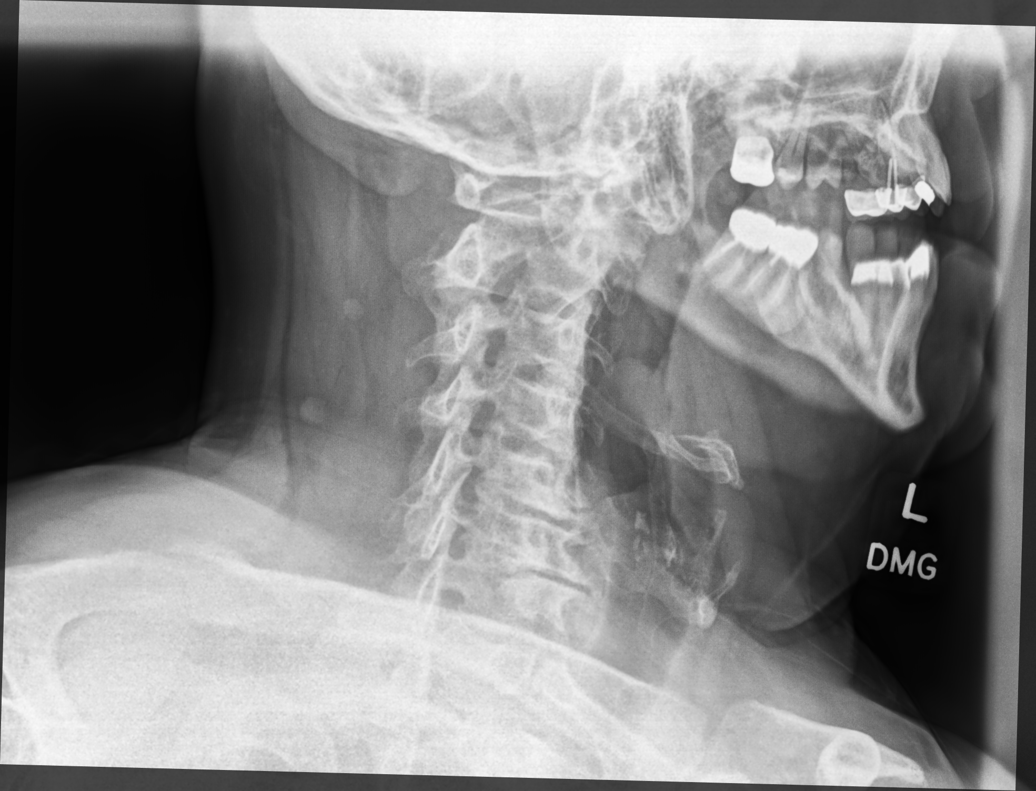

[cervical spine oblique (2 of 2)]
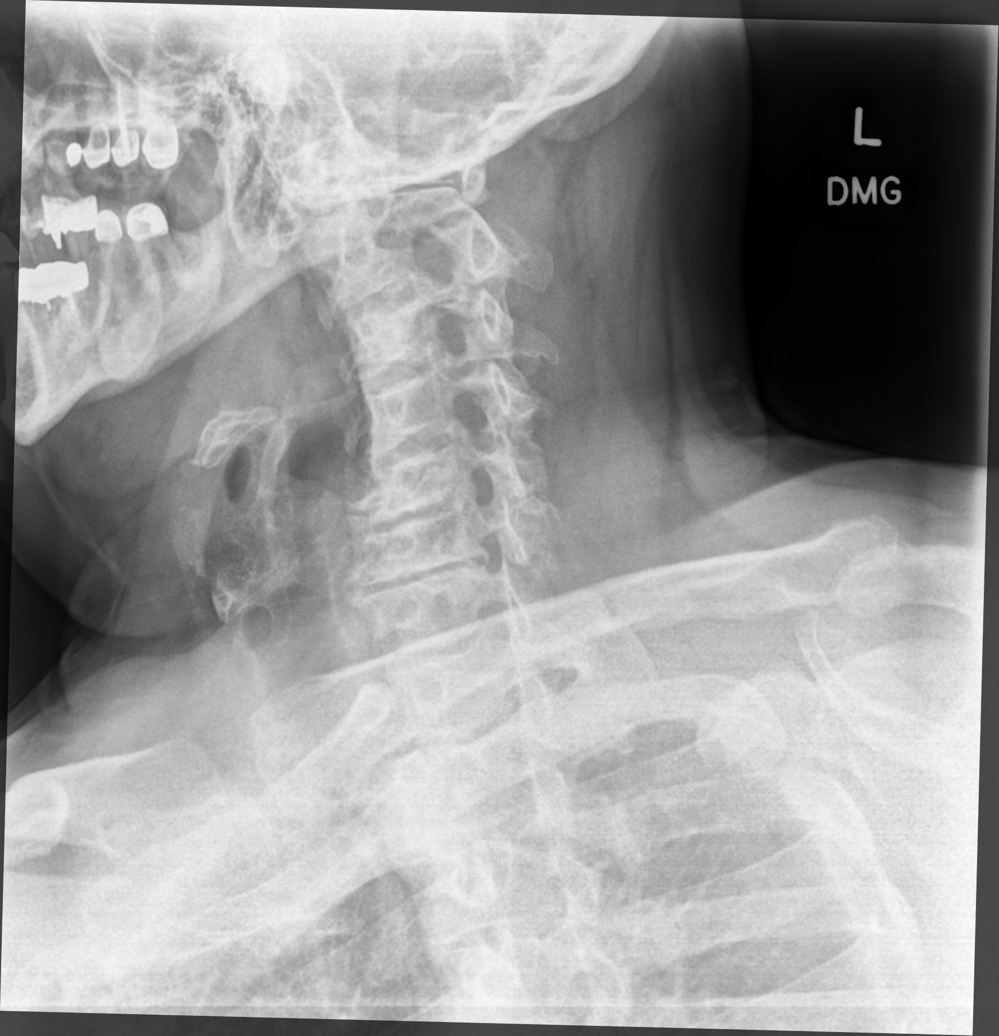

[cervical spine lat]
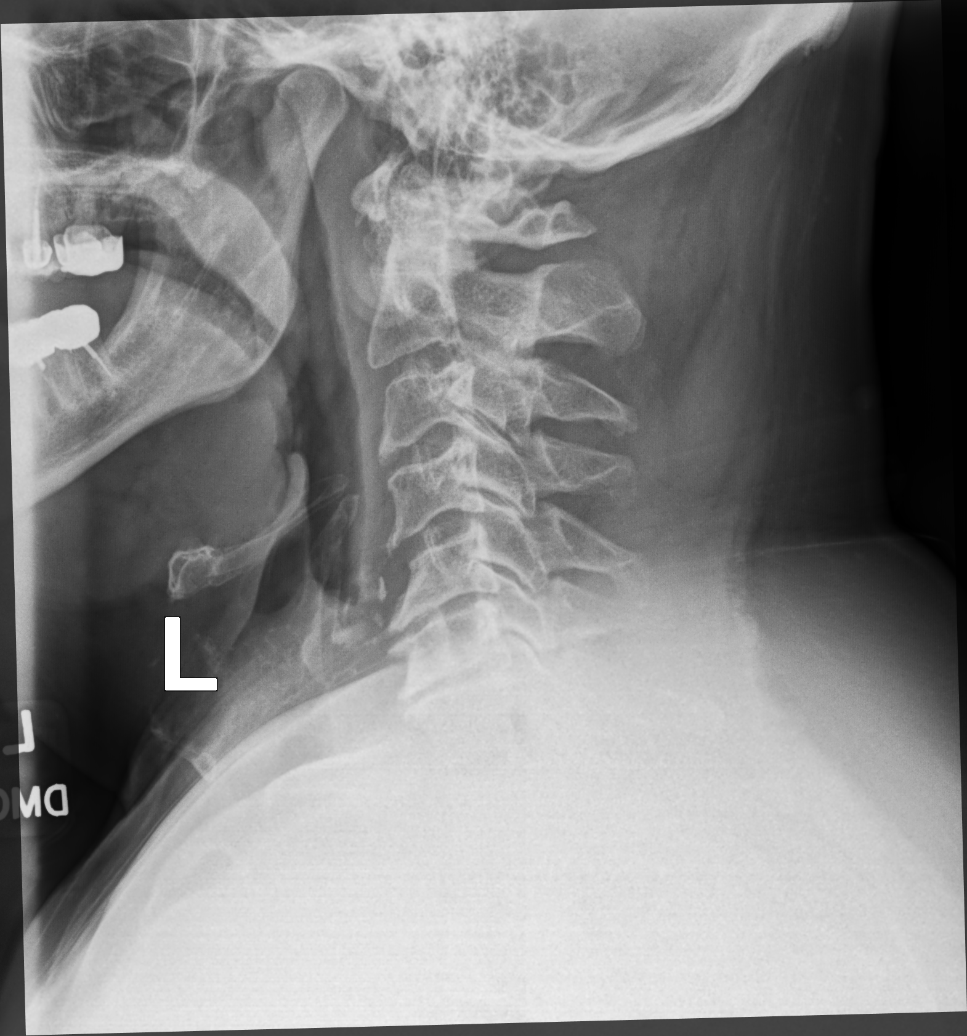

[cervical spine open mouth ap]
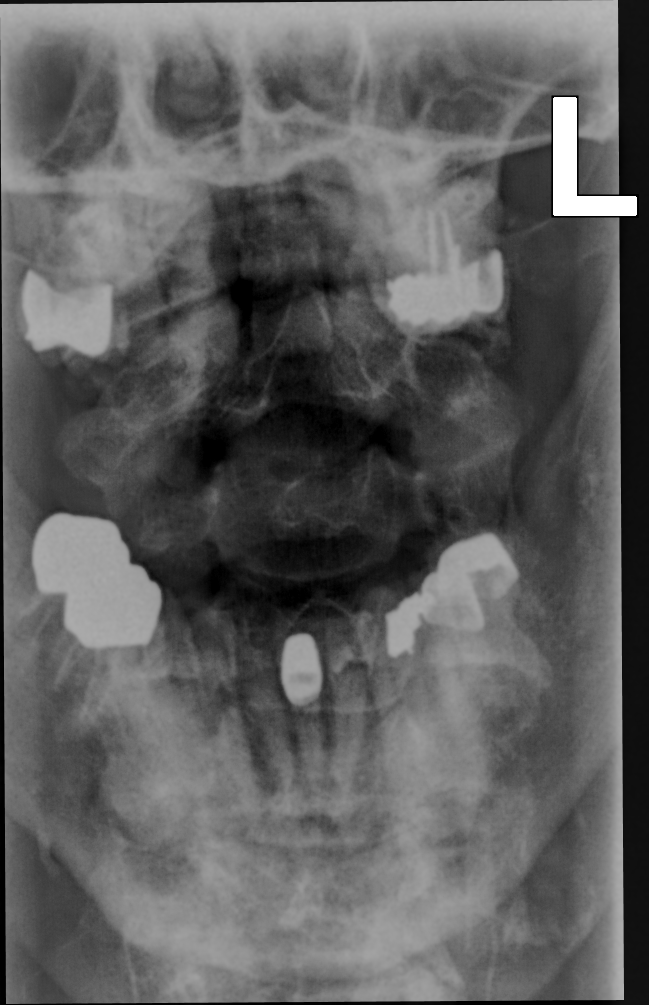

[cervico-thoracic (swimmers) lat]
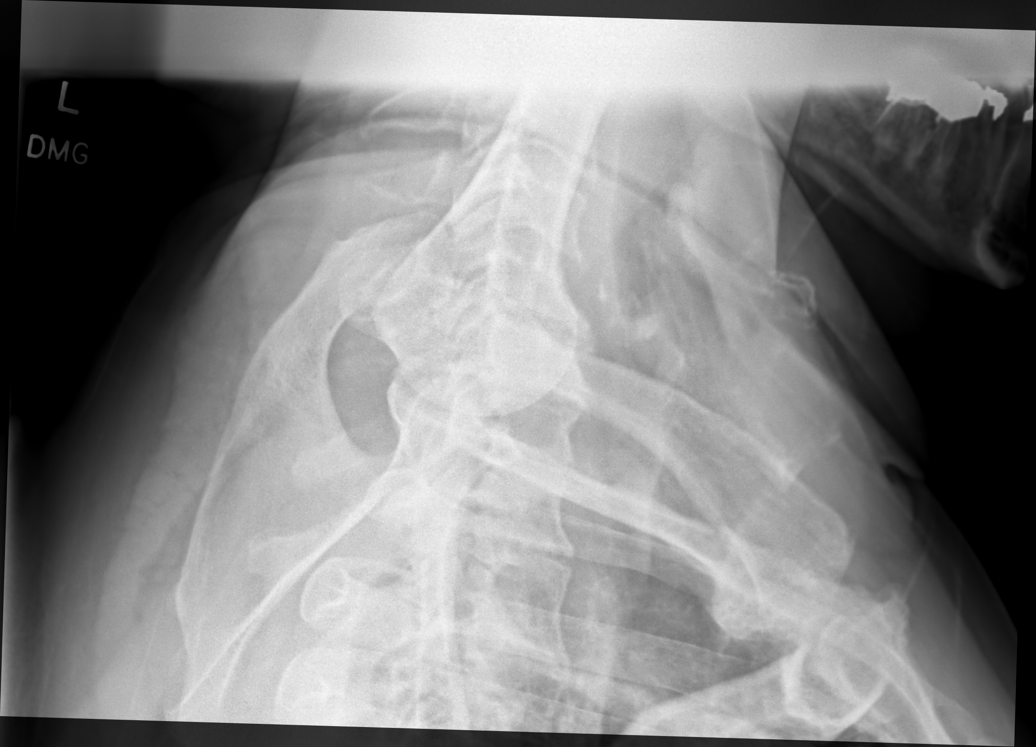

[6 of 6 positions shown; findings below may reference images not displayed]

FINDINGS: Alignment is normal. There is cervical spondylosis with disc space
narrowing and endplate osteophytes at C5-6 and C6-7. There is mild
mid cervical facet osteoarthritis. There is mild osteophytic
encroachment upon the foramina at C5-6 and C6-7 without evidence of
advanced narrowing. The patient has carotid bifurcation
atherosclerotic calcification.
IMPRESSION: C5-6 and C6-7 spondylosis and facet arthritis with small
osteophytes. The findings could relate to the described symptoms.

Carotid bifurcation calcification incidentally noted.

## 2020-06-18 ENCOUNTER — Other Ambulatory Visit: Payer: Self-pay | Admitting: Internal Medicine

## 2020-06-18 DIAGNOSIS — E785 Hyperlipidemia, unspecified: Secondary | ICD-10-CM

## 2020-06-18 DIAGNOSIS — I2510 Atherosclerotic heart disease of native coronary artery without angina pectoris: Secondary | ICD-10-CM

## 2020-06-18 DIAGNOSIS — I10 Essential (primary) hypertension: Secondary | ICD-10-CM

## 2020-07-24 ENCOUNTER — Telehealth: Payer: Self-pay | Admitting: Internal Medicine

## 2020-07-24 DIAGNOSIS — Z79899 Other long term (current) drug therapy: Secondary | ICD-10-CM

## 2020-07-24 DIAGNOSIS — I10 Essential (primary) hypertension: Secondary | ICD-10-CM

## 2020-07-24 DIAGNOSIS — I2510 Atherosclerotic heart disease of native coronary artery without angina pectoris: Secondary | ICD-10-CM

## 2020-07-24 DIAGNOSIS — E785 Hyperlipidemia, unspecified: Secondary | ICD-10-CM

## 2020-07-24 NOTE — Telephone Encounter (Signed)
Patient is requesting labs 1 week prior to scheduled appointment with [name], but there is no active request. He is requesting a call from the nurse once order is placed to be sure they are scheduled a week prior for him to come in. Please advise.

## 2020-07-29 NOTE — Addendum Note (Signed)
Addended by: Thora Lance on: 07/29/2020 03:22 PM   Modules accepted: Orders

## 2020-07-29 NOTE — Telephone Encounter (Signed)
BMP, CBC, and liver panel, fasting

## 2020-07-29 NOTE — Addendum Note (Signed)
Addended by: Willeen Cass A on: 07/29/2020 03:50 PM   Modules accepted: Orders

## 2020-08-09 ENCOUNTER — Other Ambulatory Visit: Payer: Self-pay

## 2020-08-09 ENCOUNTER — Encounter (HOSPITAL_COMMUNITY): Payer: Self-pay | Admitting: Emergency Medicine

## 2020-08-09 ENCOUNTER — Emergency Department (HOSPITAL_COMMUNITY)
Admission: EM | Admit: 2020-08-09 | Discharge: 2020-08-09 | Disposition: A | Discharge status: Home / Self Care | Payer: Managed Care, Other (non HMO) | Attending: Emergency Medicine | Admitting: Emergency Medicine

## 2020-08-09 ENCOUNTER — Emergency Department (HOSPITAL_BASED_OUTPATIENT_CLINIC_OR_DEPARTMENT_OTHER): Payer: Managed Care, Other (non HMO)

## 2020-08-09 DIAGNOSIS — Z79899 Other long term (current) drug therapy: Secondary | ICD-10-CM | POA: Insufficient documentation

## 2020-08-09 DIAGNOSIS — L538 Other specified erythematous conditions: Secondary | ICD-10-CM

## 2020-08-09 DIAGNOSIS — I10 Essential (primary) hypertension: Secondary | ICD-10-CM | POA: Diagnosis not present

## 2020-08-09 DIAGNOSIS — I2510 Atherosclerotic heart disease of native coronary artery without angina pectoris: Secondary | ICD-10-CM | POA: Diagnosis not present

## 2020-08-09 DIAGNOSIS — L03116 Cellulitis of left lower limb: Secondary | ICD-10-CM

## 2020-08-09 DIAGNOSIS — M79605 Pain in left leg: Secondary | ICD-10-CM

## 2020-08-09 DIAGNOSIS — Z96642 Presence of left artificial hip joint: Secondary | ICD-10-CM | POA: Insufficient documentation

## 2020-08-09 DIAGNOSIS — N179 Acute kidney failure, unspecified: Secondary | ICD-10-CM

## 2020-08-09 DIAGNOSIS — Z7982 Long term (current) use of aspirin: Secondary | ICD-10-CM | POA: Insufficient documentation

## 2020-08-09 DIAGNOSIS — F17210 Nicotine dependence, cigarettes, uncomplicated: Secondary | ICD-10-CM | POA: Diagnosis not present

## 2020-08-09 DIAGNOSIS — M79609 Pain in unspecified limb: Secondary | ICD-10-CM

## 2020-08-09 DIAGNOSIS — M79662 Pain in left lower leg: Secondary | ICD-10-CM | POA: Diagnosis present

## 2020-08-09 LAB — CBC WITH DIFFERENTIAL/PLATELET
Abs Immature Granulocytes: 0.05 10*3/uL (ref 0.00–0.07)
Basophils Absolute: 0.1 10*3/uL (ref 0.0–0.1)
Basophils Relative: 1 %
Eosinophils Absolute: 0.2 10*3/uL (ref 0.0–0.5)
Eosinophils Relative: 2 %
HCT: 40.7 % (ref 39.0–52.0)
Hemoglobin: 13.5 g/dL (ref 13.0–17.0)
Immature Granulocytes: 1 %
Lymphocytes Relative: 23 %
Lymphs Abs: 2 10*3/uL (ref 0.7–4.0)
MCH: 30.1 pg (ref 26.0–34.0)
MCHC: 33.2 g/dL (ref 30.0–36.0)
MCV: 90.8 fL (ref 80.0–100.0)
Monocytes Absolute: 1 10*3/uL (ref 0.1–1.0)
Monocytes Relative: 11 %
Neutro Abs: 5.5 10*3/uL (ref 1.7–7.7)
Neutrophils Relative %: 62 %
Platelets: 289 10*3/uL (ref 150–400)
RBC: 4.48 MIL/uL (ref 4.22–5.81)
RDW: 14.3 % (ref 11.5–15.5)
WBC: 8.9 10*3/uL (ref 4.0–10.5)
nRBC: 0 % (ref 0.0–0.2)

## 2020-08-09 LAB — PROTIME-INR
INR: 1 (ref 0.8–1.2)
Prothrombin Time: 13 seconds (ref 11.4–15.2)

## 2020-08-09 LAB — BASIC METABOLIC PANEL
Anion gap: 12 (ref 5–15)
BUN: 23 mg/dL — ABNORMAL HIGH (ref 6–20)
CO2: 25 mmol/L (ref 22–32)
Calcium: 9.1 mg/dL (ref 8.9–10.3)
Chloride: 101 mmol/L (ref 98–111)
Creatinine, Ser: 1.56 mg/dL — ABNORMAL HIGH (ref 0.61–1.24)
GFR, Estimated: 51 mL/min — ABNORMAL LOW (ref >60)
Glucose, Bld: 100 mg/dL — ABNORMAL HIGH (ref 70–99)
Potassium: 3.6 mmol/L (ref 3.5–5.1)
Sodium: 138 mmol/L (ref 135–145)

## 2020-08-09 MED ORDER — DOXYCYCLINE HYCLATE 100 MG PO CAPS: 100.0000 mg | ORAL_CAPSULE | Freq: Two times a day (BID) | ORAL | 0 refills | Status: DC

## 2020-08-09 NOTE — Progress Notes (Signed)
VASCULAR LAB    Left lower extremity venous duplex has been performed.  See CV proc for preliminary results.  Gave verbal report to Dr. Matthew Folks, [facility], RVT 08/09/2020, 4:59 PM

## 2020-08-09 NOTE — Discharge Instructions (Addendum)
You were seen in the emergency department for redness and tenderness of your left lower leg.  You had an ultrasound that did not show any evidence of a blood clot.  We are starting you on some antibiotics for possible cellulitis.  Your lab work also showed your kidney number was slightly elevated and this will need to be followed up with your primary care doctor.  Please return to the emergency department for any worsening or concerning symptoms

## 2020-08-09 NOTE — ED Triage Notes (Signed)
C/o pain, swelling, and redness to anterior L lower leg x 2-3 days.

## 2020-08-09 NOTE — ED Provider Notes (Signed)
[facility] Provider Note   CSN: [id number] Arrival date & time: 08/09/20  1508     History Chief Complaint  Patient presents with  . Leg Pain    David Cowan is a 60 y.o. male.  He has a history of coronary disease and peripheral vascular disease.  Complaining of some swelling and redness to his left lower leg for the last few days.  He does not recall any trauma but he works under [employer] so he might have banged it on something.  Seems to be mostly on the anterior and lateral mid lower leg.  No fevers chills nausea vomiting chest pain shortness of breath.  No vomiting diarrhea.  No prior history of DVT.  He has some peripheral vascular disease follows with Dr. Lorra Hals from vascular and is on Pletal. He is afraid that he may have a blood clot.  The history is provided by the patient.  Leg Pain Location:  Leg Leg location:  L lower leg Pain details:    Quality:  Aching   Radiates to:  Does not radiate   Severity:  Mild   Onset quality:  Gradual   Timing:  Constant   Progression:  Worsening Chronicity:  New Relieved by:  None tried Worsened by:  Nothing Ineffective treatments:  None tried Associated symptoms: swelling   Associated symptoms: no fever, no muscle weakness, no neck pain and no numbness        Past Medical History:  Diagnosis Date  . Arthritis   . Bronchitis   . CAD (coronary artery disease)   . Dyslipidemia   . Hand pain   . Hard of hearing    left ear  . History of MI (myocardial infarction)   . Hyperlipidemia   . Hypertension   . Myocardial infarction ([place]) 2000  . Right flank pain     Patient Active Problem List   Diagnosis Date Noted  . Abscess 08/03/2018  . Intermittent claudication ([place]) 02/22/2018  . Neck pain 02/22/2018  . Viral illness 02/19/2016  . Acute renal failure ([place]) 02/19/2016  . Hypotension 02/19/2016  . Breast mass, right 02/19/2016  . Obese 12/11/2014  . S/P left THA, AA  12/10/2014  . Left hip pain 08/27/2014  . Allergic rhinitis 03/27/2014  . Health care maintenance 03/27/2014  . OA (osteoarthritis) of hip 03/27/2014  . Tobacco abuse 12/19/2013  . Bronchitis 12/01/2010  . MYOCARDIAL INFARCTION, HX OF 07/10/2010  . HYPERGLYCEMIA 07/10/2010  . DYSLIPIDEMIA 03/13/2009  . Essential hypertension 03/13/2009  . CAD 03/13/2009    Past Surgical History:  Procedure Laterality Date  . CARDIAC CATHETERIZATION    . TOTAL HIP ARTHROPLASTY Left 12/10/2014   Procedure: LEFT TOTAL HIP ARTHROPLASTY ANTERIOR APPROACH;  Surgeon: Paralee Cancel, MD;  Location: [facility];  Service: Orthopedics;  Laterality: Left;  Marland Kitchen VASECTOMY         Family History  Problem Relation Age of Onset  . Heart attack Father 3  . Coronary artery disease Other         fam hx, early  . Heart attack Other 40       uncle  . Factor V Leiden deficiency Sister   . Congestive Heart Failure Mother 42  . Lung cancer Mother   . Colon cancer Neg Hx     Social History   Tobacco Use  . Smoking status: Current Every Day Smoker    Packs/day: 1.00    Types: Cigarettes  . Smokeless  tobacco: Never Used  Vaping Use  . Vaping Use: Never used  Substance Use Topics  . Alcohol use: Yes    Alcohol/week: 0.0 standard drinks    Comment: socailly  . Drug use: Yes    Types: Marijuana    Comment: occ use    Home Medications Prior to Admission medications   Medication Sig Start Date End Date Taking? Authorizing Provider  aspirin 81 MG tablet Take 1 tablet (81 mg total) by mouth daily. 03/21/18   Wellington Hampshire, MD  cetirizine (ZYRTEC) 10 MG tablet Take 10 mg by mouth daily as needed for allergies.    [provider]  cilostazol (PLETAL) 100 MG tablet Take 1 tablet (100 mg total) by mouth 2 (two) times daily. 03/04/20   Wellington Hampshire, MD  enalapril (VASOTEC) 10 MG tablet Take 1 tablet (10 mg total) by mouth daily. Please make yearly appt with [name] for April 2022 for future refills.  Thank you 1st attempt 06/19/20   Evans Lance, MD  fenofibrate 160 MG tablet TAKE 1 TABLET BY MOUTH EVERY DAY 12/07/19   Wellington Hampshire, MD  fluticasone (FLONASE) 50 MCG/ACT nasal spray Place 2 sprays into both nostrils as needed for allergies or rhinitis. 03/27/14   [name], MD  meloxicam (MOBIC) 15 MG tablet Take 1 tablet (15 mg total) by mouth daily as needed for pain. 08/30/17   Evans Lance, MD  metoprolol succinate (TOPROL-XL) 100 MG 24 hr tablet TAKE 1 TABLET BY MOUTH AT BEDTIME. TAKE WITH OR IMMEDIATELY FOLLOWING A MEAL. 11/07/19   Wellington Hampshire, MD  nitroGLYCERIN (NITROSTAT) 0.4 MG SL tablet Place 1 tablet (0.4 mg total) under the tongue every 5 (five) minutes as needed for chest pain. 03/04/20   Wellington Hampshire, MD  rosuvastatin (CRESTOR) 40 MG tablet TAKE 1 TABLET BY MOUTH EVERY DAY 03/31/20   Wellington Hampshire, MD  triamterene-hydrochlorothiazide (BJSEGBT-51) 37.5-25 MG tablet TAKE 1 TABLET BY MOUTH EVERY DAY 12/21/19   Sueanne Margarita, MD    Allergies    Amoxicillin and Penicillins  Review of Systems   Review of Systems  Constitutional: Negative for fever.  HENT: Negative for sore throat.   Eyes: Negative for visual disturbance.  Respiratory: Negative for shortness of breath.   Cardiovascular: Negative for chest pain.  Gastrointestinal: Negative for abdominal pain.  Genitourinary: Negative for dysuria.  Musculoskeletal: Negative for neck pain.  Skin: Positive for rash (LLE).  Neurological: Negative for headaches.    Physical Exam Updated Vital Signs BP (!) 157/94 (BP Location: Left Arm)   Pulse (!) 108   Temp 98.8 F (37.1 C)   Resp 16   SpO2 96%   Physical Exam Vitals and nursing note reviewed.  Constitutional:      Appearance: Normal appearance. He is well-developed and well-nourished.  HENT:     Head: Normocephalic and atraumatic.  Eyes:     Conjunctiva/sclera: Conjunctivae normal.  Cardiovascular:     Rate and Rhythm: Normal rate and regular  rhythm.     Heart sounds: No murmur heard.   Pulmonary:     Effort: Pulmonary effort is normal. No respiratory distress.     Breath sounds: Normal breath sounds.  Abdominal:     Palpations: Abdomen is soft.     Tenderness: There is no abdominal tenderness.  Musculoskeletal:        General: Swelling and tenderness present. No edema.     Cervical back: Neck supple.  Comments: Left lower extremity there is slight fullness of his anterior mid lower leg with some redness although no warmth.  No skin breaks.  No posterior leg pain and no cords appreciated.  Skin:    General: Skin is warm and dry.  Neurological:     General: No focal deficit present.     Mental Status: He is alert.  Psychiatric:        Mood and Affect: Mood and affect normal.     ED Results / Procedures / Treatments   Labs (all labs ordered are listed, but only abnormal results are displayed) Labs Reviewed  BASIC METABOLIC PANEL - Abnormal; Notable for the following components:      Result Value   Glucose, Bld 100 (*)    BUN 23 (*)    Creatinine, Ser 1.56 (*)    GFR, Estimated 51 (*)    All other components within normal limits  CBC WITH DIFFERENTIAL/PLATELET  PROTIME-INR    EKG EKG Interpretation  Date/Time:  Saturday August 09 2020 15:19:11 EST Ventricular Rate:  108 PR Interval:  150 QRS Duration: 90 QT Interval:  342 QTC Calculation: 458 R Axis:   79 Text Interpretation: Sinus tachycardia Nonspecific ST abnormality Abnormal ECG No significant change since prior 9/17 Confirmed by Aletta Edouard [phone]) on 08/09/2020 3:28:41 PM   Radiology VAS [place] LOWER EXTREMITY VENOUS (DVT) ([facility] 7a-7p)  Result Date: 08/09/2020  Lower Venous DVT Study Indications: Pain, and Erythema.  Risk Factors: Significant PAD. Comparison Study: No prior study Performing Technologist: Sharion Dove RVS  Examination Guidelines: A complete evaluation includes B-mode imaging, spectral Doppler, color Doppler, and power  Doppler as needed of all accessible portions of each vessel. Bilateral testing is considered an integral part of a complete examination. Limited examinations for reoccurring indications may be performed as noted. The reflux portion of the exam is performed with the patient in reverse Trendelenburg.  +-----+---------------+---------+-----------+----------+--------------+ RIGHTCompressibilityPhasicitySpontaneityPropertiesThrombus Aging +-----+---------------+---------+-----------+----------+--------------+ CFV  Full           Yes      Yes                                 +-----+---------------+---------+-----------+----------+--------------+   +---------+---------------+---------+-----------+----------+--------------+ LEFT     CompressibilityPhasicitySpontaneityPropertiesThrombus Aging +---------+---------------+---------+-----------+----------+--------------+ CFV      Full           Yes      Yes                                 +---------+---------------+---------+-----------+----------+--------------+ SFJ      Full                                                        +---------+---------------+---------+-----------+----------+--------------+ FV Prox  Full                                                        +---------+---------------+---------+-----------+----------+--------------+ FV Mid   Full                                                        +---------+---------------+---------+-----------+----------+--------------+  FV DistalFull                                                        +---------+---------------+---------+-----------+----------+--------------+ PFV      Full                                                        +---------+---------------+---------+-----------+----------+--------------+ POP      Full           Yes      Yes                                  +---------+---------------+---------+-----------+----------+--------------+ PTV      Full                                                        +---------+---------------+---------+-----------+----------+--------------+ PERO     Full                                                        +---------+---------------+---------+-----------+----------+--------------+     Summary: RIGHT: - No evidence of common femoral vein obstruction.  LEFT: - There is no evidence of deep vein thrombosis in the lower extremity.  *See table(s) above for measurements and observations.    Preliminary     Procedures Procedures   Medications Ordered in ED Medications - No data to display  ED Course  I have reviewed the triage vital signs and the nursing notes.  Pertinent labs & imaging results that were available during my care of the patient were reviewed by me and considered in my medical decision making (see chart for details).    MDM Rules/Calculators/A&P                         This patient complains of left leg swelling pain; this involves an extensive number of treatment Options and is a complaint that carries with it a high risk of complications and Morbidity. The differential includes, cellulitis, contusion, superficial thrombophlebitis  I ordered, reviewed and interpreted labs, which included CBC with normal white count hemoglobin, chemistries fairly normal other than elevated creatinine, has been elevated in the past but this is the highest, normal INR I ordered imaging studies which included duplex lower extremity and I independently    visualized and interpreted imaging which showed no evidence of DVT Additional history obtained from patient's wife Previous records obtained and reviewed in epic, no recent admissions  After the interventions stated above, I reevaluated the patient and found patient be in no distress, afebrile.  Reviewed work-up with him and he is comfortable with plan for  trial of antibiotics and close PCP follow-up.  Return instructions discussed.   Final Clinical Impression(s) / ED  Diagnoses Final diagnoses:  Left leg pain  Cellulitis of left lower extremity  AKI (acute kidney injury) ([place])    Rx / DC Orders ED Discharge Orders         Ordered    doxycycline (VIBRAMYCIN) 100 MG capsule  2 times daily        08/09/20 1810           Hayden Rasmussen, MD 08/10/20 [phone]

## 2020-08-09 NOTE — ED Notes (Signed)
DC instructions reviewed with the pt.  Pt verbalized instructions.  Pt DC.

## 2020-08-27 ENCOUNTER — Other Ambulatory Visit: Payer: Self-pay

## 2020-08-27 ENCOUNTER — Ambulatory Visit (HOSPITAL_COMMUNITY)
Admission: RE | Admit: 2020-08-27 | Discharge: 2020-08-27 | Disposition: A | Discharge status: Home / Self Care | Payer: Managed Care, Other (non HMO) | Source: Ambulatory Visit | Attending: Cardiovascular Disease | Admitting: Cardiovascular Disease

## 2020-08-27 DIAGNOSIS — I739 Peripheral vascular disease, unspecified: Secondary | ICD-10-CM | POA: Insufficient documentation

## 2020-09-02 ENCOUNTER — Encounter: Payer: Self-pay | Admitting: Cardiovascular Disease

## 2020-09-02 ENCOUNTER — Ambulatory Visit (INDEPENDENT_AMBULATORY_CARE_PROVIDER_SITE_OTHER): Payer: Managed Care, Other (non HMO) | Admitting: Cardiovascular Disease

## 2020-09-02 ENCOUNTER — Other Ambulatory Visit: Payer: Self-pay

## 2020-09-02 VITALS — BP 160/78 | HR 100 | Ht 71.0 in | Wt 254.6 lb

## 2020-09-02 DIAGNOSIS — I10 Essential (primary) hypertension: Secondary | ICD-10-CM | POA: Diagnosis not present

## 2020-09-02 DIAGNOSIS — E782 Mixed hyperlipidemia: Secondary | ICD-10-CM

## 2020-09-02 DIAGNOSIS — I739 Peripheral vascular disease, unspecified: Secondary | ICD-10-CM

## 2020-09-02 DIAGNOSIS — Z720 Tobacco use: Secondary | ICD-10-CM

## 2020-09-02 MED ORDER — MELOXICAM 15 MG PO TABS: 15.0000 mg | ORAL_TABLET | Freq: Every day | ORAL | 3 refills | Status: DC | PRN

## 2020-09-02 NOTE — Progress Notes (Signed)
Cardiology Office Note   Date:  09/02/2020   ID:  David, Cowan February 04, 1961, MRN [id number]  PCP:  Patient, No Pcp Per  Cardiologist:  [name]  No chief complaint on file.     History of Present Illness: ARHAAN Cowan is a 60 y.o. male who is here today for follow-up visit regarding peripheral arterial disease.   He has known history of coronary artery disease status post remote non-ST elevation myocardial infarction, hyperlipidemia, tobacco use, hypertension and arthritis. He was seen in 2019 for severe right calf claudication.He underwent noninvasive vascular evaluation which showed an ABI of 0.54 on the right and 0.72 on the left.  Duplex showed occluded right popliteal artery with one-vessel runoff below the knee.  On the left, there was significant proximal SFA stenosis.  He was treated with cilostazol.  He recently went to the emergency room with left leg swelling and redness.  No reported trauma.  Lower extremity venous duplex was negative for DVT.  The patient was treated with doxycycline.  He underwent recent arterial Doppler studies which showed an ABI of 0.64 on the right and 0.79 on the left.  He took doxycycline for 10 days and the left leg redness and pain resolved completely.  He reports stable bilateral leg claudication which is currently not lifestyle limiting.  He does feel that increasing cilostazol helped.  No chest pain or shortness of breath.     Past Medical History:  Diagnosis Date  . Arthritis   . Bronchitis   . CAD (coronary artery disease)   . Dyslipidemia   . Hand pain   . Hard of hearing    left ear  . History of MI (myocardial infarction)   . Hyperlipidemia   . Hypertension   . Myocardial infarction ([place]) 2000  . Right flank pain     Past Surgical History:  Procedure Laterality Date  . CARDIAC CATHETERIZATION    . TOTAL HIP ARTHROPLASTY Left 12/10/2014   Procedure: LEFT TOTAL HIP ARTHROPLASTY ANTERIOR APPROACH;  Surgeon: Paralee Cancel,  MD;  Location: [facility];  Service: Orthopedics;  Laterality: Left;  Marland Kitchen VASECTOMY       Current Outpatient Medications  Medication Sig Dispense Refill  . aspirin 81 MG tablet Take 1 tablet (81 mg total) by mouth daily. 90 tablet 3  . cetirizine (ZYRTEC) 10 MG tablet Take 10 mg by mouth at bedtime.    . cilostazol (PLETAL) 100 MG tablet Take 1 tablet (100 mg total) by mouth 2 (two) times daily. 180 tablet 1  . enalapril (VASOTEC) 10 MG tablet Take 1 tablet (10 mg total) by mouth daily. Please make yearly appt with [name] for April 2022 for future refills. Thank you 1st attempt 90 tablet 0  . fenofibrate 160 MG tablet TAKE 1 TABLET BY MOUTH EVERY DAY (Patient taking differently: Take 160 mg by mouth daily.) 90 tablet 3  . fluticasone (FLONASE) 50 MCG/ACT nasal spray Place 2 sprays into both nostrils as needed for allergies or rhinitis. 16 g 3  . meloxicam (MOBIC) 15 MG tablet Take 1 tablet (15 mg total) by mouth daily as needed for pain. 90 tablet 0  . metoprolol succinate (TOPROL-XL) 100 MG 24 hr tablet TAKE 1 TABLET BY MOUTH AT BEDTIME. TAKE WITH OR IMMEDIATELY FOLLOWING A MEAL. (Patient taking differently: Take 100 mg by mouth at bedtime.) 90 tablet 3  . nitroGLYCERIN (NITROSTAT) 0.4 MG SL tablet Place 1 tablet (0.4 mg total) under the tongue  every 5 (five) minutes as needed for chest pain. 25 tablet 3  . rosuvastatin (CRESTOR) 40 MG tablet TAKE 1 TABLET BY MOUTH EVERY DAY (Patient taking differently: Take 40 mg by mouth daily.) 90 tablet 1  . triamterene-hydrochlorothiazide (MAXZIDE-25) 37.5-25 MG tablet TAKE 1 TABLET BY MOUTH EVERY DAY (Patient taking differently: Take 1 tablet by mouth daily.) 90 tablet 2   No current facility-administered medications for this visit.    Allergies:   Cucumber extract, Amoxicillin, and Penicillins    Social History:  The patient  reports that he has been smoking cigarettes. He has been smoking about 1.00 pack per day. He has never used smokeless tobacco.  He reports current alcohol use. He reports current drug use. Drug: Marijuana.   Family History:  The patient's family history includes Congestive Heart Failure (age of onset: 44) in his mother; Coronary artery disease in an other family member; Factor V Leiden deficiency in his sister; Heart attack (age of onset: 52) in his father; Heart attack (age of onset: 79) in an other family member; Lung cancer in his mother.    ROS:  Please see the history of present illness.   Otherwise, review of systems are positive for none.   All other systems are reviewed and negative.    PHYSICAL EXAM: VS:  BP (!) 160/78   Pulse 100   Ht 5\' 11"  (1.803 m)   Wt 254 lb 9.6 oz (115.5 kg)   SpO2 92%   BMI 35.51 kg/m  , BMI Body mass index is 35.51 kg/m. GEN: Well nourished, well developed, in no acute distress  HEENT: normal  Neck: no JVD, carotid bruits, or masses Cardiac: RRR; no murmurs, rubs, or gallops,no edema  Respiratory:  clear to auscultation bilaterally, normal work of breathing GI: soft, nontender, nondistended, + BS MS: no deformity or atrophy  Skin: warm and dry, no rash Neuro:  Strength and sensation are intact Psych: euthymic mood, full affect Vascular: Radial pulses normal bilaterally.   Distal pulses are not palpable.  EKG:  EKG is not ordered today.    Recent Labs: 10/30/2019: ALT 13 08/09/2020: BUN 23; Creatinine, Ser 1.56; Hemoglobin 13.5; Platelets 289; Potassium 3.6; Sodium 138    Lipid Panel    Component Value Date/Time   CHOL 175 10/30/2019 0912   TRIG 188 (H) 10/30/2019 0912   HDL 40 10/30/2019 0912   CHOLHDL 4.4 10/30/2019 0912   CHOLHDL 6.8 (H) 05/17/2016 1037   VLDL 49 (H) 05/17/2016 1037   LDLCALC 102 (H) 10/30/2019 0912   LDLDIRECT 95.7 01/05/2013 0946      Wt Readings from Last 3 Encounters:  09/02/20 254 lb 9.6 oz (115.5 kg)  03/04/20 254 lb 3.2 oz (115.3 kg)  10/30/19 250 lb 11.2 oz (113.7 kg)       No flowsheet data found.    ASSESSMENT AND  PLAN:  1.  Peripheral arterial disease with moderate bilateral calf claudication right worse than left.  This is due to an occluded right popliteal artery with below the knee disease as well.  He also has left SFA disease.  His symptoms improved after increasing cilostazol to 100 mg twice daily.  Recent left leg symptoms were likely due to cellulitis and resolved completely with doxycycline.    2.  Tobacco use: He cut down on tobacco use but has not quit completely.   3.  Mixed hyperlipidemia: He is on rosuvastatin and fenofibrate.  Most recent lipid profile showed improvement in triglyceride to 188  and LDL down to 102.  He is scheduled for repeat labs next month.  Depending on his LDL, could consider adding Zetia or if LDL is significantly high, he might benefit from treatment with a PCSK9 inhibitor.  4.  Essential hypertension: Blood pressure is elevated today but he did not take his antihypertensive medications including Toprol last night.  5.  Coronary artery disease involving native coronary arteries without angina: Stable overall.  Continue medical therapy.   Disposition:   FU with me in 12 months  Signed,  Kathlyn Sacramento, MD  09/02/2020 9:02 AM    David Cowan

## 2020-09-02 NOTE — Patient Instructions (Signed)
Medication Instructions:  No Changes In Medications at this time.  *If you need a refill on your cardiac medications before your next appointment, please call your pharmacy*  Follow-Up: At [facility], you and your health needs are our priority.  As part of our continuing mission to provide you with exceptional heart care, we have created designated Provider Care Teams.  These Care Teams include your primary Cardiologist (physician) and Advanced Practice Providers (APPs -  Physician Assistants and Nurse Practitioners) who all work together to provide you with the care you need, when you need it.  Your next appointment:   1 year(s)  The format for your next appointment:   In Person  Provider:   Muhammad Arida, MD  

## 2020-09-18 ENCOUNTER — Other Ambulatory Visit: Payer: Self-pay | Admitting: Internal Medicine

## 2020-09-18 DIAGNOSIS — I2510 Atherosclerotic heart disease of native coronary artery without angina pectoris: Secondary | ICD-10-CM

## 2020-09-18 DIAGNOSIS — E785 Hyperlipidemia, unspecified: Secondary | ICD-10-CM

## 2020-09-18 DIAGNOSIS — I10 Essential (primary) hypertension: Secondary | ICD-10-CM

## 2020-09-20 ENCOUNTER — Other Ambulatory Visit: Payer: Self-pay | Admitting: Cardiology

## 2020-09-20 DIAGNOSIS — I2510 Atherosclerotic heart disease of native coronary artery without angina pectoris: Secondary | ICD-10-CM

## 2020-09-20 DIAGNOSIS — E785 Hyperlipidemia, unspecified: Secondary | ICD-10-CM

## 2020-09-20 DIAGNOSIS — I10 Essential (primary) hypertension: Secondary | ICD-10-CM

## 2020-09-26 ENCOUNTER — Other Ambulatory Visit: Payer: Managed Care, Other (non HMO) | Admitting: *Deleted

## 2020-09-26 ENCOUNTER — Other Ambulatory Visit: Payer: Self-pay

## 2020-09-26 DIAGNOSIS — I10 Essential (primary) hypertension: Secondary | ICD-10-CM

## 2020-09-26 DIAGNOSIS — E785 Hyperlipidemia, unspecified: Secondary | ICD-10-CM

## 2020-09-26 DIAGNOSIS — Z79899 Other long term (current) drug therapy: Secondary | ICD-10-CM

## 2020-09-26 DIAGNOSIS — I2510 Atherosclerotic heart disease of native coronary artery without angina pectoris: Secondary | ICD-10-CM

## 2020-09-26 LAB — CBC WITH DIFFERENTIAL/PLATELET
Basophils Absolute: 0.1 10*3/uL (ref 0.0–0.2)
Basos: 1 %
EOS (ABSOLUTE): 0.4 10*3/uL (ref 0.0–0.4)
Eos: 4 %
Hematocrit: 41.9 % (ref 37.5–51.0)
Hemoglobin: 14.3 g/dL (ref 13.0–17.7)
Immature Grans (Abs): 0.1 10*3/uL (ref 0.0–0.1)
Immature Granulocytes: 1 %
Lymphocytes Absolute: 2.5 10*3/uL (ref 0.7–3.1)
Lymphs: 28 %
MCH: 30.4 pg (ref 26.6–33.0)
MCHC: 34.1 g/dL (ref 31.5–35.7)
MCV: 89 fL (ref 79–97)
Monocytes Absolute: 0.8 10*3/uL (ref 0.1–0.9)
Monocytes: 9 %
Neutrophils Absolute: 5.1 10*3/uL (ref 1.4–7.0)
Neutrophils: 57 %
Platelets: 252 10*3/uL (ref 150–450)
RBC: 4.7 x10E6/uL (ref 4.14–5.80)
RDW: 13.8 % (ref 11.6–15.4)
WBC: 9 10*3/uL (ref 3.4–10.8)

## 2020-09-26 LAB — LIPID PANEL
Chol/HDL Ratio: 5.5 ratio — ABNORMAL HIGH (ref 0.0–5.0)
Cholesterol, Total: 192 mg/dL (ref 100–199)
HDL: 35 mg/dL — ABNORMAL LOW (ref >39)
LDL Chol Calc (NIH): 110 mg/dL — ABNORMAL HIGH (ref 0–99)
Triglycerides: 270 mg/dL — ABNORMAL HIGH (ref 0–149)
VLDL Cholesterol Cal: 47 mg/dL — ABNORMAL HIGH (ref 5–40)

## 2020-09-26 LAB — BASIC METABOLIC PANEL
BUN/Creatinine Ratio: 22 — ABNORMAL HIGH (ref 9–20)
BUN: 33 mg/dL — ABNORMAL HIGH (ref 6–24)
CO2: 21 mmol/L (ref 20–29)
Calcium: 8.8 mg/dL (ref 8.7–10.2)
Chloride: 103 mmol/L (ref 96–106)
Creatinine, Ser: 1.52 mg/dL — ABNORMAL HIGH (ref 0.76–1.27)
Glucose: 100 mg/dL — ABNORMAL HIGH (ref 65–99)
Potassium: 4.7 mmol/L (ref 3.5–5.2)
Sodium: 138 mmol/L (ref 134–144)
eGFR: 52 mL/min/{1.73_m2} — ABNORMAL LOW (ref >59)

## 2020-10-03 ENCOUNTER — Other Ambulatory Visit: Payer: Self-pay

## 2020-10-03 ENCOUNTER — Encounter: Payer: Self-pay | Admitting: Internal Medicine

## 2020-10-03 ENCOUNTER — Ambulatory Visit (INDEPENDENT_AMBULATORY_CARE_PROVIDER_SITE_OTHER): Payer: Managed Care, Other (non HMO) | Admitting: Internal Medicine

## 2020-10-03 VITALS — BP 120/70 | HR 84 | Ht 71.0 in | Wt 258.0 lb

## 2020-10-03 DIAGNOSIS — R7309 Other abnormal glucose: Secondary | ICD-10-CM

## 2020-10-03 DIAGNOSIS — I739 Peripheral vascular disease, unspecified: Secondary | ICD-10-CM | POA: Diagnosis not present

## 2020-10-03 DIAGNOSIS — I2510 Atherosclerotic heart disease of native coronary artery without angina pectoris: Secondary | ICD-10-CM

## 2020-10-03 NOTE — Patient Instructions (Addendum)
Medication Instructions:  Your physician recommends that you continue on your current medications as directed. Please refer to the Current Medication list given to you today.  Labwork: None ordered.  Testing/Procedures: None ordered.  Follow-Up:  You are being referred to the [facility] pharmacist lipid clinic.  Your physician wants you to follow-up in: one year with Cristopher Peru, MD or one of the following Advanced Practice Providers on your designated Care Team:    Chanetta Marshall, NP  Tommye Standard, PA-C  Legrand Como "Jonni Sanger" Chalmers Cater, [place]  Any Other Special Instructions Will Be Listed Below (If Applicable).  If you need a refill on your cardiac medications before your next appointment, please call your pharmacy.

## 2020-10-03 NOTE — Progress Notes (Signed)
HPI [name] returns today for followup. He is a pleasant 60 yo man with CAD, s/p remote NSTEMI, dyslipidemia, HTN, and arthritis. He has undergone left hip replacement several years ago. He is back to work with no limitation. He denies anginal symptoms despite his physically demanding job. He has had claudication and was seen by Dr. Fletcher Cowan and found to have bilateral peripheral vascular disease.He is still smoking. He has not lost weight and he continues to have dyslipidemia with elevated triglycerides and LDL cholesterol that is not at goal. He has a strenuous job and denies angina.  Allergies  Allergen Reactions  . Cucumber Extract Anaphylaxis and Other (See Comments)    "Black out"  . Amoxicillin Rash  . Penicillins Rash     Current Outpatient Medications  Medication Sig Dispense Refill  . aspirin 81 MG tablet Take 1 tablet (81 mg total) by mouth daily. 90 tablet 3  . cetirizine (ZYRTEC) 10 MG tablet Take 10 mg by mouth at bedtime.    . cilostazol (PLETAL) 100 MG tablet Take 1 tablet (100 mg total) by mouth 2 (two) times daily. 180 tablet 1  . enalapril (VASOTEC) 10 MG tablet Take 1 tablet (10 mg total) by mouth daily. 90 tablet 1  . fenofibrate 160 MG tablet TAKE 1 TABLET BY MOUTH EVERY DAY 90 tablet 3  . fluticasone (FLONASE) 50 MCG/ACT nasal spray Place 2 sprays into both nostrils as needed for allergies or rhinitis. 16 g 3  . meloxicam (MOBIC) 15 MG tablet Take 1 tablet (15 mg total) by mouth daily as needed for pain. 30 tablet 3  . metoprolol succinate (TOPROL-XL) 100 MG 24 hr tablet TAKE 1 TABLET BY MOUTH AT BEDTIME. TAKE WITH OR IMMEDIATELY FOLLOWING A MEAL. (Patient taking differently: TAKE 1 TABLET BY MOUTH AT  BEDTIME. TAKE WITH OR  IMMEDIATELY FOLLOWING A  MEAL.) 90 tablet 3  . nitroGLYCERIN (NITROSTAT) 0.4 MG SL tablet Place 1 tablet (0.4 mg total) under the tongue every 5 (five) minutes as needed for chest pain. 25 tablet 3  . rosuvastatin (CRESTOR) 40 MG tablet TAKE  1 TABLET BY MOUTH EVERY DAY 90 tablet 1  . triamterene-hydrochlorothiazide (MAXZIDE-25) 37.5-25 MG tablet TAKE 1 TABLET BY MOUTH EVERY DAY 90 tablet 2   No current facility-administered medications for this visit.     Past Medical History:  Diagnosis Date  . Arthritis   . Bronchitis   . CAD (coronary artery disease)   . Dyslipidemia   . Hand pain   . Hard of hearing    left ear  . History of MI (myocardial infarction)   . Hyperlipidemia   . Hypertension   . Myocardial infarction ([place]) 2000  . Right flank pain     ROS:   All systems reviewed and negative except as noted in the HPI.   Past Surgical History:  Procedure Laterality Date  . CARDIAC CATHETERIZATION    . TOTAL HIP ARTHROPLASTY Left 12/10/2014   Procedure: LEFT TOTAL HIP ARTHROPLASTY ANTERIOR APPROACH;  Surgeon: Paralee Cancel, MD;  Location: [facility];  Service: Orthopedics;  Laterality: Left;  Marland Kitchen VASECTOMY       Family History  Problem Relation Age of Onset  . Heart attack Father 26  . Coronary artery disease Other         fam hx, early  . Heart attack Other 40       uncle  . Factor V Leiden deficiency Sister   . Congestive Heart  Failure Mother 72  . Lung cancer Mother   . Colon cancer Neg Hx      Social History   Socioeconomic History  . Marital status: Married    Spouse name: Not on file  . Number of children: 2  . Years of education: Not on file  . Highest education level: Not on file  Occupational History  . Occupation: SERVICE MANAGER    Employer: [employer]  Tobacco Use  . Smoking status: Current Every Day Smoker    Packs/day: 1.00    Types: Cigarettes  . Smokeless tobacco: Never Used  Vaping Use  . Vaping Use: Never used  Substance and Sexual Activity  . Alcohol use: Yes    Alcohol/week: 0.0 standard drinks    Comment: socailly  . Drug use: Yes    Types: Marijuana    Comment: occ use  . Sexual activity: Not on file  Other Topics Concern  . Not on file  Social History  Narrative   2 daughters   Social Determinants of Health   Financial Resource Strain: Not on file  Food Insecurity: Not on file  Transportation Needs: Not on file  Physical Activity: Not on file  Stress: Not on file  Social Connections: Not on file  Intimate Partner Violence: Not on file     BP 120/70 (BP Location: Left Arm, Patient Position: Sitting, Cuff Size: Normal)   Pulse 84   Ht 5\' 11"  (1.803 m)   Wt 258 lb (117 kg)   SpO2 92%   BMI 35.98 kg/m   Physical Exam:  Well appearing middle aged man, NAD HEENT: Unremarkable Neck:  No JVD, no thyromegally Lymphatics:  No adenopathy Back:  No CVA tenderness Lungs:  Clear with no wheezes HEART:  Regular rate rhythm, no murmurs, no rubs, no clicks Abd:  soft, positive bowel sounds, no organomegally, no rebound, no guarding Ext:  2 plus pulses, no edema, no cyanosis, no clubbing Skin:  No rashes no nodules Neuro:  CN II through XII intact, motor grossly intact   Assess/Plan: 1. CAD - he denies anginal symptoms. He will continue his current meds. 2. Dyslipidemia - I have recommended he start on a PCSK9 inhibitor due to prior NSTEMI and peripheral vascular disease and a LDL cholesterol of 110 despite maximal statin therapy. 3. HTN - his bp is well controlled.  4. Obesity - I have strongly encouraged him to give up sweets of all kinds and explained the detrimental role of fructose.  David Overlie Namiyah Grantham,MD

## 2020-10-04 ENCOUNTER — Other Ambulatory Visit: Payer: Self-pay | Admitting: Cardiovascular Disease

## 2020-10-06 ENCOUNTER — Other Ambulatory Visit: Payer: Self-pay | Admitting: Cardiology

## 2020-10-06 DIAGNOSIS — I10 Essential (primary) hypertension: Secondary | ICD-10-CM

## 2020-10-06 DIAGNOSIS — E785 Hyperlipidemia, unspecified: Secondary | ICD-10-CM

## 2020-10-06 DIAGNOSIS — I2510 Atherosclerotic heart disease of native coronary artery without angina pectoris: Secondary | ICD-10-CM

## 2020-10-06 NOTE — Telephone Encounter (Signed)
Refill Request.  

## 2020-10-24 ENCOUNTER — Other Ambulatory Visit (HOSPITAL_COMMUNITY): Payer: Self-pay

## 2020-11-04 ENCOUNTER — Ambulatory Visit (INDEPENDENT_AMBULATORY_CARE_PROVIDER_SITE_OTHER): Payer: Managed Care, Other (non HMO) | Admitting: Pharmacist

## 2020-11-04 ENCOUNTER — Other Ambulatory Visit: Payer: Self-pay

## 2020-11-04 DIAGNOSIS — E785 Hyperlipidemia, unspecified: Secondary | ICD-10-CM

## 2020-11-04 DIAGNOSIS — R7309 Other abnormal glucose: Secondary | ICD-10-CM | POA: Diagnosis not present

## 2020-11-04 DIAGNOSIS — I2510 Atherosclerotic heart disease of native coronary artery without angina pectoris: Secondary | ICD-10-CM

## 2020-11-04 NOTE — Patient Instructions (Addendum)
It was a pleasure to meet you!  Please work on cutting back on sweet tea and soda to eventually drinking nothing with added sugars  Try reducing your cigarettes one cigarette per week.  I will submit a prior authorization to your insurance company for [employer]. I will call you once it is approved.  Please call me at [phone] with any questions  TIPS for Living a healthier life  SUGAR  Sugar is a huge problem in the modern day diet. Sugar is a HUGE contributor to heart disease, diabetes, high triglyceride levels, fatty liver diease and obesity. Sugar is hidden in almost all packaged foods/beverages. Added sugar is extra sugar that is added beyond what is naturally found. It adds no nutritional benefit to your body and can cause major harm. The American Heart Association recommends limiting added sugars to no more than 25g for women and 36 grams for men per day.  There are many names for sugar maltose, sucrose (names ending in "ose"), high fructose corn syrup, molasses, cane sugar, corn sweetener, raw sugar, syrup, honey or fruit juice concentrate.   One of the best ways to limit your added sugars is to stop drinking sweetened beverages such as soda, sweet tea, fruit juice or fancy coffee's. There is 65g of added sugars in one 20oz bottle of Coke!! That is equal to 6 donuts.   Pay attention and read all nutrition facts labels. Below is an examples of a nutrition facts label. The #1 is showing you the total sugars where the # 2 is showing you the added sugars. This one severing has almost the max amount of added sugars per day!  Watch out for items that say "low fat" or "no added sugar" as these products are typically very high in sugar. The food industry uses these terms to fool you into thinking they are healthy.  For more information on the dangers of sugar watch WHY Sugar is as Bad as Alcohol (Fructose, The Liver Toxin) on YouTube.    EXERCISE  Exercise is good. We've all heard  that. In an ideal world, we would all have time and resources to  get plenty of it. When you are active your heart pumps more efficiently and you will feel better.  Multiple studies show that even walking regularly has benefits that include living a longer life.  The American Heart Association recommends 90-150 minutes per week of exercise (30 minutes  per day most days of the week). You can do this in any increment you wish. Nine or more  10-minute walks count. So does an hour-long exercise class. Break the time apart into what will  work in your life. Some of the best things you can do include walking briskly, jogging, cycling or  swimming laps. Not everyone is ready to "exercise." Sometimes we need to start with just getting active. Here  are some easy ways to be more active throughout the day: Marland Kitchen Take the stairs instead of the elevator . Go for a 10-15 minute walk during your lunch break (find a friend to make it more enjoyable) . When shopping, park at the back of the parking lot . If you take public transportation, get off one stop early and walk the extra distance . Pace around while making phone calls (most of [place] are not attached to phone cords any longer!) Check with your doctor if you aren't sure what your limitations may be. Always remember to drink plenty of water when doing any type of  exercise. Don't feel like a failure if you're not getting the 90-150 minutes per week. If you started by being  a couch potato, then just a 10-minute walk each day is a huge improvement. Start with little  victories and work your way up.   Healthy Eating Tips  When looking to improve your eating habits, whether to lose weight, lower blood pressure or just be healthier, it helps to know what a serving size is.   Grains 1 slice of bread,  bagel,  cup pasta or rice  Vegetables 1 cup fresh or raw vegetables,  cup cooked or canned Fruits 1 piece of medium sized fruit,  cup canned,    Meats/Proteins  cup dried       1 oz meat, 1 egg,  cup cooked beans, nuts or seeds  Dairy        Fats Individual yogurt container, 1 cup (8oz)    1 teaspoon margarine/butter or vegetable  milk or milk alternative, 1 slice of cheese          oil; 1 tablespoon mayonnaise or salad dressing                  Plan ahead: make a menu of the meals for a week then create a grocery list to go with  that menu. Consider meals that easily stretch into a night of leftovers, such as stews or  casseroles. Or consider making two of your favorite meal and put one in the freezer or fridge for  another night.  When you get home from the grocery store wash and prepare your vegetables and fruits.  Then when you need them they are ready to go.  Tips for going to the grocery store: . Buy store or generic brands . Check the weekly ad from your store on-line or in their in-store flyer . Look at the unit price on the shelf tag to compare/contrast the costs of different items . Buy fruits/vegetables in season . Carrots, bananas and apples are low-cost, naturally healthy items . If meats or frozen vegetables are on sale, buy some extras and put in your freezer . Limit buying prepared or "ready to eat" items, even if they are pre-made salads or fruit snacks . Do not shop when you're hungry . Foods at eye level tend to be more expensive. Look on the high and low shelves for deals. . Consider shopping at the farmer's market for fresh foods in season. . Choose canned tuna or salmon instead of fresh . Avoid the cookie and chip aisles (these are expensive, high in calories and low in  nutritional value). Shop on the outside of the grocery store.  Aim to have one 12 hour fast each day. This means no eating after dinner until breakfast. For example, if you eat dinner around 6 PM then you would not eat anything until 6 AM the next day. This is a great way to help lower your insulin levels, lose weight and reduce your  blood pressure.   Healthy food preparations: . If you can't get lean hamburger, be sure to drain the fat when cooking . Steam, saut (in olive oil), grill or bake foods . Experiment with different seasonings to avoid adding salt to your foods. Kosher salt, sea salt and Himalayan salt are all still salt and should be avoided Try seasoning food with onion, garlic, thyme, rosemary, basil ect. Onion powder or garlic powder is ok. Avoid if it says salt (ie garlic salt).  Resources: American Heart Association - InstantFinish.fi Go to the Healthy Living tab to get more information American Diabetes Association - www.diabetes.org You don't have to be diabetic - check out the Food and Fitness tab

## 2020-11-04 NOTE — Progress Notes (Signed)
Patient ID: David Cowan                 DOB: 1961-06-03                    MRN: [id number]     HPI: David Cowan is a 60 y.o. male patient referred to lipid clinic by [name]. PMH is significant for CAD, s/p remote NSTEMI, dyslipidemia, HTN, and arthritis, PVD and HLD. He is currently on rosuvastatin 40mg  and fenofibrate 160mg . LDL and TG are uncontrolled. He was referred to lipid clinic for consideration of PCSK9i.  Patient presents today to lipid clinic. He states he is still smoking, but wants to quite on his own. He does not do any exercise other than his job which is physical. 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 He eats a lot of fast food and a large amount of sweet tea. Although it sounds like his tea and soda consumption is less than it use to be. He takes his medications as prescribed.  Current Medications: rosuvastatin 40mg  daily, fenofibrate 160mg  daily Risk Factors: premature CAD, HTN, PVD, tobacco use LDL goal: <55  Diet: eats twice a day Brunch: [place], [place] mikes, cook out (always eats out) Holiday representative: baked beans, pork loin, green beans (eats out twice a week for dinner) Tries not to eat anything after 8PM Drink: sweet tea, water, some soda (not as much) Snacks: nabs, oatmeal cookie  Exercise: physical job, but not much other exercise  Family History:  Family History  Problem Relation Age of Onset  . Heart attack Father 21  . Coronary artery disease Other         fam hx, early  . Heart attack Other 40       uncle  . Factor V Leiden deficiency Sister   . Congestive Heart Failure Mother 60  . Lung cancer Mother   . Colon cancer Neg Hx      Social History: current smoker  Labs: 09/26/20 TC 192, TG 270, HDL 35, LDL 110 (rosuvastatin 40mg  daily, fenofibrate 160mg  daily)  Past Medical History:  Diagnosis Date  . Arthritis   . Bronchitis   . CAD (coronary artery  disease)   . Dyslipidemia   . Hand pain   . Hard of hearing    left ear  . History of MI (myocardial infarction)   . Hyperlipidemia   . Hypertension   . Myocardial infarction ([place]) 2000  . Right flank pain     Current Outpatient Medications on File Prior to Visit  Medication Sig Dispense Refill  . aspirin 81 MG tablet Take 1 tablet (81 mg total) by mouth daily. 90 tablet 3  . cetirizine (ZYRTEC) 10 MG tablet Take 10 mg by mouth at bedtime.    . cilostazol (PLETAL) 100 MG tablet Take 1 tablet (100 mg total) by mouth 2 (two) times daily. 180 tablet 1  . enalapril (VASOTEC) 10 MG tablet Take 1 tablet (10 mg total) by mouth daily. 90 tablet 1  . fenofibrate 160 MG tablet TAKE 1 TABLET BY MOUTH EVERY DAY 90 tablet 3  . fluticasone (FLONASE) 50 MCG/ACT nasal spray Place 2 sprays into both nostrils as needed for allergies or rhinitis. 16 g 3  . meloxicam (MOBIC) 15 MG tablet Take 1 tablet (15 mg total) by mouth daily as needed for pain. 30 tablet  3  . metoprolol succinate (TOPROL-XL) 100 MG 24 hr tablet TAKE 1 TABLET BY MOUTH AT BEDTIME. TAKE WITH OR IMMEDIATELY FOLLOWING A MEAL. (Patient taking differently: TAKE 1 TABLET BY MOUTH AT  BEDTIME. TAKE WITH OR  IMMEDIATELY FOLLOWING A  MEAL.) 90 tablet 3  . nitroGLYCERIN (NITROSTAT) 0.4 MG SL tablet Place 1 tablet (0.4 mg total) under the tongue every 5 (five) minutes as needed for chest pain. 25 tablet 3  . rosuvastatin (CRESTOR) 40 MG tablet TAKE 1 TABLET BY MOUTH EVERY DAY 90 tablet 1  . triamterene-hydrochlorothiazide (MAXZIDE-25) 37.5-25 MG tablet TAKE 1 TABLET BY MOUTH EVERY DAY 90 tablet 3   No current facility-administered medications on file prior to visit.    Allergies  Allergen Reactions  . Cucumber Extract Anaphylaxis and Other (See Comments)    "Black out"  . Amoxicillin Rash  . Penicillins Rash    Assessment/Plan:  1. Hyperlipidemia - LDL is above goal of <55. TG are above goal of <150 as well. We had a long conversation  about diet. Strongly encouraged patient to stop drinking sweetened beverages, limit his fast food and sweets. We talked about packing his own lunch. We also discussed added PCSK9i. Reviewed injection technique, side effects and cost. Patient is agreeable. Will submit prior authorization for Praluent. Continue rosuvastatin 40mg  daily and fenofibrate 160mg  daily. I will call patient once approved. Check lipids in 2-3 months. I encouraged him to increase his exercise. Try walking, resting as needed.  2. Tobacco Abuse- Patient desires to quite, but does not wish to use any quite aid. We discussed decreasing the # of cigarettes he smokes by 1 each week until he reaches 0. Patient in agreement to try.   Thank you,   Ramond Dial, Pharm.D, BCPS, CPP [place]  [address], [place], [place] [address]  Phone: [phone]; Fax: [phone]

## 2020-11-05 ENCOUNTER — Telehealth: Payer: Self-pay | Admitting: Pharmacist

## 2020-11-05 MED ORDER — PRALUENT 75 MG/ML ~~LOC~~ SOAJ: 1.0000 "pen " | SUBCUTANEOUS | 11 refills | Status: DC

## 2020-11-05 NOTE — Telephone Encounter (Signed)
Praluent approved through 11/05/2021 Rx copay card actived BIN [id number] PCN: [id number] GRP: [id number] ID: [id number]  Spoke with patient and advised him that medication was approved. Cost would be $25. I will call patient in 2 months to set up labs

## 2020-11-28 ENCOUNTER — Other Ambulatory Visit: Payer: Self-pay | Admitting: Cardiovascular Disease

## 2021-01-11 ENCOUNTER — Other Ambulatory Visit: Payer: Self-pay | Admitting: Cardiovascular Disease

## 2021-01-12 NOTE — Telephone Encounter (Signed)
Please advise if refill is appropriate for NSAID medication.

## 2021-01-14 ENCOUNTER — Telehealth: Payer: Self-pay

## 2021-01-14 DIAGNOSIS — E785 Hyperlipidemia, unspecified: Secondary | ICD-10-CM

## 2021-01-14 NOTE — Telephone Encounter (Signed)
Called and spoke w/pt and stated that they need to schedule appt for labs fasting on 01/19/21 at 830am

## 2021-01-19 ENCOUNTER — Other Ambulatory Visit: Payer: Managed Care, Other (non HMO) | Admitting: *Deleted

## 2021-01-19 ENCOUNTER — Other Ambulatory Visit: Payer: Self-pay

## 2021-01-19 DIAGNOSIS — E785 Hyperlipidemia, unspecified: Secondary | ICD-10-CM

## 2021-01-19 LAB — LIPID PANEL
Chol/HDL Ratio: 4.5 ratio (ref 0.0–5.0)
Cholesterol, Total: 177 mg/dL (ref 100–199)
HDL: 39 mg/dL — ABNORMAL LOW (ref >39)
LDL Chol Calc (NIH): 92 mg/dL (ref 0–99)
Triglycerides: 273 mg/dL — ABNORMAL HIGH (ref 0–149)
VLDL Cholesterol Cal: 46 mg/dL — ABNORMAL HIGH (ref 5–40)

## 2021-01-19 LAB — HEPATIC FUNCTION PANEL
ALT: 10 IU/L (ref 0–44)
AST: 16 IU/L (ref 0–40)
Albumin: 3.5 g/dL — ABNORMAL LOW (ref 3.8–4.9)
Alkaline Phosphatase: 38 IU/L — ABNORMAL LOW (ref 44–121)
Bilirubin Total: 0.2 mg/dL (ref 0.0–1.2)
Bilirubin, Direct: <0.1 mg/dL (ref 0.00–0.40)
Total Protein: 6 g/dL (ref 6.0–8.5)

## 2021-01-20 ENCOUNTER — Telehealth: Payer: Self-pay | Admitting: Pharmacist

## 2021-01-20 DIAGNOSIS — E785 Hyperlipidemia, unspecified: Secondary | ICD-10-CM

## 2021-01-20 NOTE — Telephone Encounter (Signed)
Called patient to review his lab results. Very minimal reduction in LDL seen. TG still elevated. Will need to assess compliance to praluent, rosuvastatin and fenofibrate. Will also need to discuss reducing his sweetened beverages.  If patient reports compliance, then will increase praluent to '150mg'$ . LVM for pt to call back

## 2021-01-21 MED ORDER — PRALUENT 150 MG/ML ~~LOC~~ SOAJ: 1.0000 "pen " | SUBCUTANEOUS | 11 refills | Status: DC

## 2021-01-21 NOTE — Telephone Encounter (Signed)
Pt returned call. Reports adherence to Praluent, rosuvastatin, and fenofibrate. States he could decrease intake of carbs and sweets. Drinks at least 1 glass of sweet tea a day which I have advised him to stop. Will also increase Praluent to '150mg'$  Q2W. Scheduled f/u labs in November.

## 2021-01-22 ENCOUNTER — Other Ambulatory Visit: Payer: Self-pay | Admitting: Cardiovascular Disease

## 2021-01-22 DIAGNOSIS — I2510 Atherosclerotic heart disease of native coronary artery without angina pectoris: Secondary | ICD-10-CM

## 2021-01-22 DIAGNOSIS — E785 Hyperlipidemia, unspecified: Secondary | ICD-10-CM

## 2021-01-22 DIAGNOSIS — I10 Essential (primary) hypertension: Secondary | ICD-10-CM

## 2021-01-22 NOTE — Telephone Encounter (Signed)
Refill request

## 2021-02-02 MED ORDER — PRALUENT 150 MG/ML ~~LOC~~ SOAJ: 1.0000 "pen " | SUBCUTANEOUS | 11 refills | Status: DC

## 2021-02-02 NOTE — Addendum Note (Signed)
Addended by: Marcelle Overlie D on: 02/02/2021 02:10 PM   Modules accepted: Orders

## 2021-03-11 ENCOUNTER — Other Ambulatory Visit: Payer: Self-pay | Admitting: Cardiovascular Disease

## 2021-03-11 DIAGNOSIS — E785 Hyperlipidemia, unspecified: Secondary | ICD-10-CM

## 2021-03-11 DIAGNOSIS — I2510 Atherosclerotic heart disease of native coronary artery without angina pectoris: Secondary | ICD-10-CM

## 2021-03-11 DIAGNOSIS — I10 Essential (primary) hypertension: Secondary | ICD-10-CM

## 2021-03-22 ENCOUNTER — Other Ambulatory Visit: Payer: Self-pay | Admitting: Internal Medicine

## 2021-03-22 DIAGNOSIS — I10 Essential (primary) hypertension: Secondary | ICD-10-CM

## 2021-04-14 ENCOUNTER — Other Ambulatory Visit: Payer: Self-pay

## 2021-04-14 ENCOUNTER — Other Ambulatory Visit: Payer: Managed Care, Other (non HMO) | Admitting: *Deleted

## 2021-04-14 DIAGNOSIS — E785 Hyperlipidemia, unspecified: Secondary | ICD-10-CM

## 2021-04-14 LAB — LIPID PANEL
Chol/HDL Ratio: 2.2 ratio (ref 0.0–5.0)
Cholesterol, Total: 87 mg/dL — ABNORMAL LOW (ref 100–199)
HDL: 40 mg/dL
LDL Chol Calc (NIH): 27 mg/dL (ref 0–99)
Triglycerides: 108 mg/dL (ref 0–149)
VLDL Cholesterol Cal: 20 mg/dL (ref 5–40)

## 2021-04-17 ENCOUNTER — Telehealth: Payer: Self-pay | Admitting: Internal Medicine

## 2021-04-17 NOTE — Telephone Encounter (Signed)
   Pt c/o medication issue:  1. Name of Medication:   Alirocumab (PRALUENT) 150 MG/ML SOAJ Inject     2. How are you currently taking this medication (dosage and times per day)? Inject 1 pen into the skin every 14 (fourteen) days.  3. Are you having a reaction (difficulty breathing--STAT)?   4. What is your medication issue? Pt did a recent lipids check and he said everything is low and wanted to know if he needs to take his praluent this Sunday. He said if someone can call him today, pt provided this values  Cholesterol, Total 100 - 199 mg/dL 87 Low    Triglycerides 0 - 149 mg/dL 108    HDL >39 mg/dL 40    VLDL Cholesterol Cal 5 - 40 mg/dL 20    LDL Chol Calc (NIH) 0 - 99 mg/dL 27   Chol/HDL Ratio 0.0 - 5.0 ratio 2.2

## 2021-04-17 NOTE — Telephone Encounter (Signed)
Yes, patient receiving expected benefit from Praluent.  Recommend continuing to inject every 2 weeks.  Should not have any adverse effects from low LDL.

## 2021-04-20 NOTE — Telephone Encounter (Signed)
Response sent in mychart message this AM

## 2021-04-20 NOTE — Telephone Encounter (Signed)
Ramond Dial, RPH-CPP to [name]     7:10 AM Mr. Eichelberger,   Yes, it is ok for your total cholesterol to be 87. There have been no long term adverse effects seen in any of the Repatha or Praluent studies when LDL was <25. In fact when we are born, our LDL is around 30. Please continue taking your Praluent 150mg  every 14 days.   Lenna Sciara, PharmD  Last read by [name] at 7:31 AM on 04/20/2021.

## 2021-06-29 ENCOUNTER — Other Ambulatory Visit: Payer: Self-pay | Admitting: Internal Medicine

## 2021-06-29 DIAGNOSIS — I10 Essential (primary) hypertension: Secondary | ICD-10-CM

## 2021-07-05 ENCOUNTER — Other Ambulatory Visit: Payer: Self-pay | Admitting: Cardiovascular Disease

## 2021-09-16 ENCOUNTER — Encounter: Payer: Self-pay | Admitting: Nurse Practitioner

## 2021-09-16 ENCOUNTER — Ambulatory Visit (INDEPENDENT_AMBULATORY_CARE_PROVIDER_SITE_OTHER)
Admission: RE | Admit: 2021-09-16 | Discharge: 2021-09-16 | Disposition: A | Discharge status: Home / Self Care | Payer: Managed Care, Other (non HMO) | Source: Ambulatory Visit | Attending: Nurse Practitioner | Admitting: Nurse Practitioner

## 2021-09-16 ENCOUNTER — Ambulatory Visit (INDEPENDENT_AMBULATORY_CARE_PROVIDER_SITE_OTHER): Payer: Managed Care, Other (non HMO) | Admitting: Nurse Practitioner

## 2021-09-16 VITALS — BP 152/78 | HR 77 | Temp 97.6°F | Resp 16 | Ht 71.0 in | Wt 257.1 lb

## 2021-09-16 DIAGNOSIS — Z6835 Body mass index (BMI) 35.0-35.9, adult: Secondary | ICD-10-CM

## 2021-09-16 DIAGNOSIS — Z0000 Encounter for general adult medical examination without abnormal findings: Secondary | ICD-10-CM

## 2021-09-16 DIAGNOSIS — Z125 Encounter for screening for malignant neoplasm of prostate: Secondary | ICD-10-CM | POA: Diagnosis not present

## 2021-09-16 DIAGNOSIS — I10 Essential (primary) hypertension: Secondary | ICD-10-CM

## 2021-09-16 DIAGNOSIS — I739 Peripheral vascular disease, unspecified: Secondary | ICD-10-CM | POA: Diagnosis not present

## 2021-09-16 DIAGNOSIS — I252 Old myocardial infarction: Secondary | ICD-10-CM

## 2021-09-16 DIAGNOSIS — M25522 Pain in left elbow: Secondary | ICD-10-CM | POA: Diagnosis not present

## 2021-09-16 DIAGNOSIS — Z720 Tobacco use: Secondary | ICD-10-CM

## 2021-09-16 DIAGNOSIS — Z122 Encounter for screening for malignant neoplasm of respiratory organs: Secondary | ICD-10-CM

## 2021-09-16 LAB — CBC WITH DIFFERENTIAL/PLATELET
Basophils Absolute: 0.1 10*3/uL (ref 0.0–0.1)
Basophils Relative: 0.8 % (ref 0.0–3.0)
Eosinophils Absolute: 0.4 10*3/uL (ref 0.0–0.7)
Eosinophils Relative: 4 % (ref 0.0–5.0)
HCT: 40.1 % (ref 39.0–52.0)
Hemoglobin: 13.2 g/dL (ref 13.0–17.0)
Lymphocytes Relative: 21.7 % (ref 12.0–46.0)
Lymphs Abs: 2 10*3/uL (ref 0.7–4.0)
MCHC: 32.8 g/dL (ref 30.0–36.0)
MCV: 91.4 fl (ref 78.0–100.0)
Monocytes Absolute: 0.7 10*3/uL (ref 0.1–1.0)
Monocytes Relative: 7.7 % (ref 3.0–12.0)
Neutro Abs: 6.2 10*3/uL (ref 1.4–7.7)
Neutrophils Relative %: 65.8 % (ref 43.0–77.0)
Platelets: 211 10*3/uL (ref 150.0–400.0)
RBC: 4.39 Mil/uL (ref 4.22–5.81)
RDW: 15.1 % (ref 11.5–15.5)
WBC: 9.4 10*3/uL (ref 4.0–10.5)

## 2021-09-16 LAB — COMPREHENSIVE METABOLIC PANEL
ALT: 8 U/L (ref 0–53)
AST: 15 U/L (ref 0–37)
Albumin: 3.1 g/dL — ABNORMAL LOW (ref 3.5–5.2)
Alkaline Phosphatase: 34 U/L — ABNORMAL LOW (ref 39–117)
BUN: 30 mg/dL — ABNORMAL HIGH (ref 6–23)
CO2: 29 mEq/L (ref 19–32)
Calcium: 8.3 mg/dL — ABNORMAL LOW (ref 8.4–10.5)
Chloride: 106 mEq/L (ref 96–112)
Creatinine, Ser: 2.12 mg/dL — ABNORMAL HIGH (ref 0.40–1.50)
GFR: 33.11 mL/min — ABNORMAL LOW (ref >60.00)
Glucose, Bld: 96 mg/dL (ref 70–99)
Potassium: 4.2 mEq/L (ref 3.5–5.1)
Sodium: 141 mEq/L (ref 135–145)
Total Bilirubin: 0.2 mg/dL (ref 0.2–1.2)
Total Protein: 5.3 g/dL — ABNORMAL LOW (ref 6.0–8.3)

## 2021-09-16 LAB — HEMOGLOBIN A1C: Hgb A1c MFr Bld: 6 % (ref 4.6–6.5)

## 2021-09-16 LAB — TSH: TSH: 3.73 u[IU]/mL (ref 0.35–5.50)

## 2021-09-16 LAB — PSA: PSA: 1.59 ng/mL (ref 0.10–4.00)

## 2021-09-16 NOTE — Assessment & Plan Note (Signed)
Managed on enalapril, metoprolol, and maxzide. Blood pressure above goal today even after recheck. Patient states that he has taken his medications today. Continue medications as prescribed and follow up with cardiology as recommended ?

## 2021-09-16 NOTE — Assessment & Plan Note (Signed)
Hx of the same. Approx 20 years ago. Followed by cardiology ?

## 2021-09-16 NOTE — Assessment & Plan Note (Signed)
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  No ecchymosis.  Edema and tenderness pending x-ray ?

## 2021-09-16 NOTE — Patient Instructions (Signed)
Nice to see you today ?I will be in touch with the lab results ?Follow up with me in 1 year, sooner if you need me ?

## 2021-09-16 NOTE — Assessment & Plan Note (Signed)
Did discuss with patient.  He is not interested in medical assisted smoking cessation.  States he has been weaning off the tobacco slowly by himself.  Encouraged to continue to do so and to quit.  We will place LDCT referral ?

## 2021-09-16 NOTE — Progress Notes (Signed)
? ?New Patient Office Visit ? ?Subjective:  ?Patient ID: David Cowan, male    DOB: 1960/06/23  Age: 61 y.o. MRN: [id number] ? ?CC:  ?Chief Complaint  ?Patient presents with  ? Tranfer of Care  ?  Use to see Dr Deborra Medina with [facility].  ? Annual Exam  ? ? ?HPI ?David Cowan presents for for complete physical and follow up of chronic conditions. ? ?Immunizations: ?-Tetanus:2019 ?-Influenza:rout of season ?-Covid-19: pfizer x2 ?-Shingles: information given  ?-Pneumonia: information given ? ?-HPV: aged out ? ?Diet: Fair diet. Eat around 10-11am (eating out) and then will eat a snack 2-3 oclock. 730-8 dinner. Will drink water ?Exercise: No regular exercise.  ? ?Eye exam: Completes annually. Completed 2023  ?Dental exam: Completes semi-annually  ? ? ?Colonoscopy: Completed in  2015 and repeat in 2025 with Dr. Fuller Plan ?Dexa: NA ?PSA: Due, nocturia every 3 hours ? ?Lung Cancer Screening:  Ambulatory referral placed ? ?HTN: Does not normally check blood pressure at home but can ? ?RW:[id number] by cardiology and sees dr taylor yearly ? ?HLD: currently maintained on praluent and crestor '40mg'$   ? ?Tobacco abuse: has tried to stop smoking in the past. States that he is slowing down on his own. He states that he does not want medical assisted smoking cessation  ? ?Past Medical History:  ?Diagnosis Date  ? Arthritis   ? Bronchitis   ? CAD (coronary artery disease)   ? Dyslipidemia   ? Hand pain   ? Hard of hearing   ? left ear  ? History of MI (myocardial infarction)   ? Hyperlipidemia   ? Hypertension   ? Myocardial infarction [facility]) 2000  ? Right flank pain   ? ? ?Past Surgical History:  ?Procedure Laterality Date  ? CARDIAC CATHETERIZATION    ? TOTAL HIP ARTHROPLASTY Left 12/10/2014  ? Procedure: LEFT TOTAL HIP ARTHROPLASTY ANTERIOR APPROACH;  Surgeon: Paralee Cancel, MD;  Location: [facility];  Service: Orthopedics;  Laterality: Left;  ? VASECTOMY    ? ? ?Family History  ?Problem Relation Age of Onset  ? Congestive Heart Failure Mother 50  ?  Lung cancer Mother   ? Heart attack Father 24  ? Factor V Leiden deficiency Sister   ? Factor V Leiden deficiency Brother   ? Coronary artery disease Other   ?      fam hx, early  ? Heart attack Other 94  ?     uncle  ? Colon cancer Neg Hx   ? ? ?Social History  ? ?Socioeconomic History  ? Marital status: Married  ?  Spouse name: Not on file  ? Number of children: 2  ? Years of education: Not on file  ? Highest education level: Not on file  ?Occupational History  ? Occupation: SERVICE MANAGER  ?  Employer: [employer]  ?Tobacco Use  ? Smoking status: Every Day  ?  Packs/day: 0.75  ?  Years: 40.00  ?  Pack years: 30.00  ?  Types: Cigarettes  ? Smokeless tobacco: Never  ?Vaping Use  ? Vaping Use: Never used  ?Substance and Sexual Activity  ? Alcohol use: Yes  ?  Comment: once a week at 6 beers  ? Drug use: Yes  ?  Types: Marijuana  ?  Comment: once a week  ? Sexual activity: Not on file  ?Other Topics Concern  ? Not on file  ?Social History Narrative  ? 2 daughters  ?   ? 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 ?Social Determinants of Health  ? ?Financial Resource Strain: Not on file  ?Food Insecurity: Not on file  ?Transportation Needs: Not on file  ?Physical Activity: Not on file  ?Stress: Not on file  ?Social Connections: Not on file  ?Intimate Partner Violence: Not on file  ? ? ?ROS ?Review of Systems  ?Constitutional:  Positive for fatigue. Negative for chills and fever.  ?Respiratory:  Positive for cough. Negative for shortness of breath.   ?Cardiovascular:  Negative for chest pain and leg swelling.  ?Gastrointestinal:  Negative for diarrhea, nausea and vomiting.  ?     BM daily ?  ?Genitourinary:  Negative for difficulty urinating, dysuria and hematuria.  ?     Nocturia every 3 hours. ? ?Postvoid dribble "+"  ?Neurological:  Negative for dizziness, weakness, light-headedness, numbness and headaches.  ?Psychiatric/Behavioral:  Negative for hallucinations and suicidal ideas.   ? ?Objective:  ? ?Today's  Vitals: BP (!) 152/78   Pulse 77   Temp 97.6 ?F (36.4 ?C)   Resp 16   Ht '5\' 11"'$  (1.803 m)   Wt 257 lb 2 oz (116.6 kg)   SpO2 92%   BMI 35.86 kg/m?  ? ?Physical Exam ?Vitals and nursing note reviewed. Exam conducted with a chaperone present [facility], RMA).  ?Constitutional:   ?   Appearance: Normal appearance.  ?HENT:  ?   Right Ear: Tympanic membrane, ear canal and external ear normal.  ?   Left Ear: Tympanic membrane, ear canal and external ear normal.  ?   Mouth/Throat:  ?   Mouth: Mucous membranes are moist.  ?   Pharynx: Oropharynx is clear.  ?Eyes:  ?   Extraocular Movements: Extraocular movements intact.  ?   Pupils: Pupils are equal, round, and reactive to light.  ?   Comments: Wears correctiv elenses  ?Cardiovascular:  ?   Rate and Rhythm: Normal rate and regular rhythm.  ?   Pulses: Normal pulses.  ?   Heart sounds: Normal heart sounds.  ?Pulmonary:  ?   Effort: Pulmonary effort is normal.  ?   Breath sounds: Normal breath sounds.  ?Abdominal:  ?   General: Bowel sounds are normal. There is no distension.  ?   Palpations: Abdomen is soft. There is no mass.  ?   Tenderness: There is no abdominal tenderness.  ?   Hernia: No hernia is present. There is no hernia in the left inguinal area or right inguinal area.  ?Genitourinary: ?   Penis: Normal.   ?   Testes: Normal.  ?   Epididymis:  ?   Right: Normal.  ?   Left: Normal.  ?Musculoskeletal:  ?   Right lower leg: No edema.  ?   Left lower leg: No edema.  ?Lymphadenopathy:  ?   Lower Body: No right inguinal adenopathy. No left inguinal adenopathy.  ?Neurological:  ?   General: No focal deficit present.  ?   Mental Status: He is alert.  ?   Deep Tendon Reflexes:  ?   Reflex Scores: ?     Bicep reflexes are 2+ on the right side and 2+ on the left side. ?     Patellar reflexes are 2+ on the right side and 2+ on the left side. ?   Comments: Bilateral upper an dlowe extremity strength 5/5  ?Psychiatric:     ?   Mood and Affect: Mood normal.     ?  Behavior: Behavior normal.     ?   Thought Content: Thought content normal.     ?   Judgment: Judgment normal.  ? ? ?Assessment & Plan:  ? ?Problem List Items Addressed This Visit   ? ?  ? Cardiovascular and Mediastinum  ? Essential hypertension  ?  Managed on enalapril, metoprolol, and maxzide. Blood pressure above goal today even after recheck. Patient states that he has taken his medications today. Continue medications as prescribed and follow up with cardiology as recommended ?  ?  ? Relevant Orders  ? CBC with Differential/Platelet  ? Comprehensive metabolic panel  ? TSH  ? MYOCARDIAL INFARCTION, HX OF  ?  Hx of the same. Approx 20 years ago. Followed by cardiology ?  ?  ? PVD (peripheral vascular disease) ([place])  ?  He is followed by vascular. States 100% blockage on the right leg below the knee. States 75% on the left hip. Continue following with vascular.  Continue taking Cilostazol as prescribed. ?  ?  ?  ? Other  ? Tobacco abuse  ?  Did discuss with patient.  He is not interested in medical assisted smoking cessation.  States he has been weaning off the tobacco slowly by himself.  Encouraged to continue to do so and to quit.  We will place LDCT referral ?  ?  ? Preventative health care - Primary  ? Relevant Orders  ? CBC with Differential/Platelet  ? Comprehensive metabolic panel  ? TSH  ? Hemoglobin A1c  ? Obese  ? Relevant Orders  ? Hemoglobin A1c  ? Left elbow pain  ?  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  No ecchymosis.  Edema and tenderness pending x-ray ?  ?  ? Relevant Orders  ? DG Elbow Complete Left  ? ?Other Visit Diagnoses   ? ? Screening for prostate cancer      ? Relevant Orders  ? PSA  ? Screening for lung cancer      ? Relevant Orders  ? Ambulatory Referral Lung Cancer Screening [facility]  ? ?  ? ? ?Outpatient Encounter Medications as of 09/16/2021  ?Medication Sig  ? Alirocumab (PRALUENT) 150 MG/ML SOAJ Inject 1  pen into the skin every 14 (fourteen) days.  ? aspirin 81 MG tablet Take 1 tablet (81 mg total) by mouth daily.  ? cetirizine (ZYRTEC) 10 MG tablet Take 10 mg by mouth at bedtime.  ? cilostazol (PLETAL) 1

## 2021-09-16 NOTE — Assessment & Plan Note (Addendum)
He is followed by vascular. States 100% blockage on the right leg below the knee. States 75% on the left hip. Continue following with vascular.  Continue taking Cilostazol as prescribed. ?

## 2021-09-22 ENCOUNTER — Telehealth: Payer: Self-pay | Admitting: Nurse Practitioner

## 2021-09-22 NOTE — Telephone Encounter (Signed)
-----   Message from Califon sent at 09/22/2021 12:27 PM EDT ----- ?Patient states he is scheduled to see his cardiologist on 10/19/21 and will be off that day for that appointment and wanted to see if kidney function can be checked during his visit then as he usually gets labs done with their office when goes anyway. Advised I would send a note to cardiologist to ask if this is ok. Can this be drawn on that day? ?

## 2021-09-22 NOTE — Telephone Encounter (Signed)
I am ok with that for sure. Let me know if they cardiologist is good for that too. ?

## 2021-09-24 ENCOUNTER — Other Ambulatory Visit: Payer: Self-pay | Admitting: Internal Medicine

## 2021-09-24 ENCOUNTER — Other Ambulatory Visit: Payer: Self-pay | Admitting: Cardiovascular Disease

## 2021-09-24 DIAGNOSIS — I10 Essential (primary) hypertension: Secondary | ICD-10-CM

## 2021-09-24 DIAGNOSIS — E785 Hyperlipidemia, unspecified: Secondary | ICD-10-CM

## 2021-09-24 DIAGNOSIS — I2510 Atherosclerotic heart disease of native coronary artery without angina pectoris: Secondary | ICD-10-CM

## 2021-09-24 NOTE — Telephone Encounter (Signed)
Will ask Dr Lovena Le if this is ok. Waiting on feedback ?

## 2021-09-25 NOTE — Telephone Encounter (Signed)
Yes just BMP ?

## 2021-09-25 NOTE — Telephone Encounter (Signed)
Refill request

## 2021-10-19 ENCOUNTER — Ambulatory Visit (INDEPENDENT_AMBULATORY_CARE_PROVIDER_SITE_OTHER): Payer: Managed Care, Other (non HMO) | Admitting: Internal Medicine

## 2021-10-19 ENCOUNTER — Encounter: Payer: Self-pay | Admitting: Internal Medicine

## 2021-10-19 VITALS — BP 148/70 | HR 96 | Ht 70.5 in | Wt 256.6 lb

## 2021-10-19 DIAGNOSIS — I2510 Atherosclerotic heart disease of native coronary artery without angina pectoris: Secondary | ICD-10-CM | POA: Diagnosis not present

## 2021-10-19 DIAGNOSIS — E785 Hyperlipidemia, unspecified: Secondary | ICD-10-CM

## 2021-10-19 DIAGNOSIS — I10 Essential (primary) hypertension: Secondary | ICD-10-CM

## 2021-10-19 LAB — BASIC METABOLIC PANEL
BUN/Creatinine Ratio: 17 (ref 10–24)
BUN: 41 mg/dL — ABNORMAL HIGH (ref 8–27)
CO2: 23 mmol/L (ref 20–29)
Calcium: 8.6 mg/dL (ref 8.6–10.2)
Chloride: 105 mmol/L (ref 96–106)
Creatinine, Ser: 2.43 mg/dL — ABNORMAL HIGH (ref 0.76–1.27)
Glucose: 116 mg/dL — ABNORMAL HIGH (ref 70–99)
Potassium: 4 mmol/L (ref 3.5–5.2)
Sodium: 139 mmol/L (ref 134–144)
eGFR: 30 mL/min/{1.73_m2} — ABNORMAL LOW (ref >59)

## 2021-10-19 NOTE — Patient Instructions (Addendum)
Medication Instructions:  ?Your physician recommends that you continue on your current medications as directed. Please refer to the Current Medication list given to you today. ? ?Labwork: ?You will get lab work today:  BMP ? ?Testing/Procedures: ?None ordered. ? ?Follow-Up: ? ?Your physician wants you to follow-up in: one year with [name].   You will receive a reminder letter in the mail two months in advance. If you don't receive a letter, please call our office to schedule the follow-up appointment. ? ? ?Any Other Special Instructions Will Be Listed Below (If Applicable). ? ?If you need a refill on your cardiac medications before your next appointment, please call your pharmacy.  ? ?Important Information About Sugar ? ? ? ? ? ? ? ?

## 2021-10-19 NOTE — Progress Notes (Signed)
? ? ? ? ?HPI ?[name] returns today for followup. He is a pleasant 61 yo man with premature CAD, s/p remote NSTEMI over 20 years ago. He has had trouble with his diet. He eats too much sugar. He has developed peripheral vascular disease and has been treated with more aggressive lipid lowering. He denies chest pain or sob. No palpitations. Review of his most recent lipids demonstrates an LDL cholesterol of 27. His triglycerides have come down from 273 to 108. His claudication is present but better.  ?Allergies  ?Allergen Reactions  ? Cucumber Extract Anaphylaxis and Other (See Comments)  ?  "Black out"  ? Amoxicillin Rash  ? Penicillins Rash  ? ? ? ?Current Outpatient Medications  ?Medication Sig Dispense Refill  ? Alirocumab (PRALUENT) 150 MG/ML SOAJ Inject 1 pen into the skin every 14 (fourteen) days. 2 mL 11  ? aspirin 81 MG tablet Take 1 tablet (81 mg total) by mouth daily. 90 tablet 3  ? cetirizine (ZYRTEC) 10 MG tablet Take 10 mg by mouth at bedtime.    ? cilostazol (PLETAL) 100 MG tablet TAKE 1 TABLET BY MOUTH TWICE A DAY 180 tablet 3  ? enalapril (VASOTEC) 10 MG tablet TAKE 1 TABLET BY MOUTH EVERY DAY 90 tablet 0  ? fenofibrate 160 MG tablet Take 1 tablet (160 mg total) by mouth daily. Schedule an appointment for further refills, 1st attempt 60 tablet 0  ? fluticasone (FLONASE) 50 MCG/ACT nasal spray Place 2 sprays into both nostrils as needed for allergies or rhinitis. 16 g 3  ? meloxicam (MOBIC) 15 MG tablet Take 1 tablet (15 mg total) by mouth daily as needed for pain. 30 tablet 3  ? metoprolol succinate (TOPROL-XL) 100 MG 24 hr tablet TAKE 1 TABLET BY MOUTH AT BEDTIME. TAKE WITH OR IMMEDIATELY FOLLOWING A MEAL. 90 tablet 3  ? nitroGLYCERIN (NITROSTAT) 0.4 MG SL tablet Place 1 tablet (0.4 mg total) under the tongue every 5 (five) minutes as needed for chest pain. 25 tablet 3  ? rosuvastatin (CRESTOR) 40 MG tablet TAKE 1 TABLET BY MOUTH EVERY DAY 90 tablet 1  ? triamterene-hydrochlorothiazide  (MAXZIDE-25) 37.5-25 MG tablet TAKE 1 TABLET BY MOUTH EVERY DAY 90 tablet 0  ? ?No current facility-administered medications for this visit.  ? ? ? ?Past Medical History:  ?Diagnosis Date  ? Arthritis   ? Bronchitis   ? CAD (coronary artery disease)   ? Dyslipidemia   ? Hand pain   ? Hard of hearing   ? left ear  ? History of MI (myocardial infarction)   ? Hyperlipidemia   ? Hypertension   ? Myocardial infarction [facility]) 2000  ? Right flank pain   ? ? ?ROS: ? ? All systems reviewed and negative except as noted in the HPI. ? ? ?Past Surgical History:  ?Procedure Laterality Date  ? CARDIAC CATHETERIZATION    ? TOTAL HIP ARTHROPLASTY Left 12/10/2014  ? Procedure: LEFT TOTAL HIP ARTHROPLASTY ANTERIOR APPROACH;  Surgeon: Paralee Cancel, MD;  Location: [facility];  Service: Orthopedics;  Laterality: Left;  ? VASECTOMY    ? ? ? ?Family History  ?Problem Relation Age of Onset  ? Congestive Heart Failure Mother 25  ? Lung cancer Mother   ? Heart attack Father 82  ? Factor V Leiden deficiency Sister   ? Factor V Leiden deficiency Brother   ? Coronary artery disease Other   ?      fam hx, early  ? Heart attack Other 40  ?  uncle  ? Colon cancer Neg Hx   ? ? ? ?Social History  ? ?Socioeconomic History  ? Marital status: Married  ?  Spouse name: Not on file  ? Number of children: 2  ? Years of education: Not on file  ? Highest education level: Not on file  ?Occupational History  ? Occupation: SERVICE MANAGER  ?  Employer: [employer]  ?Tobacco Use  ? Smoking status: Every Day  ?  Packs/day: 0.75  ?  Years: 40.00  ?  Pack years: 30.00  ?  Types: Cigarettes  ? Smokeless tobacco: Never  ?Vaping Use  ? Vaping Use: Never used  ?Substance and Sexual Activity  ? Alcohol use: Yes  ?  Comment: once a week at 6 beers  ? Drug use: Yes  ?  Types: Marijuana  ?  Comment: once a week  ? Sexual activity: Not on file  ?Other Topics Concern  ? Not on file  ?Social History Narrative  ? 2 daughters  ?   ? 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 ?Social Determinants of Health  ? ?Financial Resource Strain: Not on file  ?Food Insecurity: Not on file  ?Transportation Needs: Not on file  ?Physical Activity: Not on file  ?Stress: Not on file  ?Social Connections: Not on file  ?Intimate Partner Violence: Not on file  ? ? ? ?BP (!) 148/70   Pulse 96   Ht 5' 10.5" (1.791 m)   Wt 256 lb 9.6 oz (116.4 kg)   SpO2 90%   BMI 36.30 kg/m?  ? ?Physical Exam: ? ?Obese appearing NAD ?HEENT: Unremarkable ?Neck:  No JVD, no thyromegally ?Lymphatics:  No adenopathy ?Back:  No CVA tenderness ?Lungs:  Clear with no wheezes ?HEART:  Regular rate rhythm, no murmurs, no rubs, no clicks ?Abd:  soft, positive bowel sounds, no organomegally, no rebound, no guarding ?Ext:  2 plus pulses, no edema, no cyanosis, no clubbing ?Skin:  No rashes no nodules ?Neuro:  CN II through XII intact, motor grossly intact ? ?EKG - nsr ? ?Assess/Plan:  ?1. CAD - he denies anginal symptoms. He will continue his current meds. ?2. Dyslipidemia - He has started a PCSK9 inhibitor and his cholesterol is much improved. ?3. HTN - his sbp is not well controlled. He is encouraged to reduce his salt intake and to lose weight. ?4. Obesity - I have strongly encouraged him to give up sweets of all kinds and explained the detrimental role of fructose. He is still drinking a large amount of sweet tea and I asked him to stop. ?  ?Carleene Overlie Adalaya Irion,MD ?

## 2021-10-21 ENCOUNTER — Telehealth: Payer: Self-pay | Admitting: Nurse Practitioner

## 2021-10-21 ENCOUNTER — Telehealth: Payer: Self-pay

## 2021-10-21 DIAGNOSIS — N19 Unspecified kidney failure: Secondary | ICD-10-CM

## 2021-10-21 NOTE — Telephone Encounter (Signed)
Left detailed message on Pt's wife VM per DPR. ? ?Advised to stop enalapril.  Advised would be referring to a kidney specialist. ? ?Removed enalapril from med list.  Referral placed. ? ? ?

## 2021-10-21 NOTE — Telephone Encounter (Signed)
-----   Message from Evans Lance, MD sent at 10/21/2021 12:47 PM EDT ----- ?Stop ace inhibitor and refer to nephrology Dr. Arty Baumgartner and colleagues ?

## 2021-10-21 NOTE — Telephone Encounter (Signed)
Received secure epic chat that patient had renal function rechecked and decreased even further.  Dr. Crissie Sickles recommended discontinuing ACE inhibitor and referring to nephrology.  I agree with his assessment and for David Julian, RN states that she will get this process going ?

## 2021-10-24 ENCOUNTER — Encounter: Payer: Self-pay | Admitting: Nurse Practitioner

## 2021-12-05 ENCOUNTER — Other Ambulatory Visit: Payer: Self-pay | Admitting: Cardiovascular Disease

## 2021-12-11 NOTE — Telephone Encounter (Signed)
LVM TO SCHEDULE FOLLOW UP WITH DR. Fletcher Anon IN THE [facility] OFFICE

## 2021-12-24 ENCOUNTER — Other Ambulatory Visit: Payer: Self-pay | Admitting: Internal Medicine

## 2021-12-24 DIAGNOSIS — E785 Hyperlipidemia, unspecified: Secondary | ICD-10-CM

## 2021-12-24 DIAGNOSIS — I2510 Atherosclerotic heart disease of native coronary artery without angina pectoris: Secondary | ICD-10-CM

## 2021-12-24 DIAGNOSIS — I10 Essential (primary) hypertension: Secondary | ICD-10-CM

## 2021-12-26 ENCOUNTER — Other Ambulatory Visit: Payer: Self-pay | Admitting: Cardiovascular Disease

## 2021-12-26 DIAGNOSIS — E785 Hyperlipidemia, unspecified: Secondary | ICD-10-CM

## 2021-12-26 DIAGNOSIS — I2510 Atherosclerotic heart disease of native coronary artery without angina pectoris: Secondary | ICD-10-CM

## 2021-12-26 DIAGNOSIS — I10 Essential (primary) hypertension: Secondary | ICD-10-CM

## 2021-12-28 NOTE — Telephone Encounter (Signed)
Refill request

## 2022-01-08 ENCOUNTER — Other Ambulatory Visit: Payer: Self-pay | Admitting: Cardiovascular Disease

## 2022-01-08 NOTE — Telephone Encounter (Signed)
LVM to schedule

## 2022-01-08 NOTE — Telephone Encounter (Signed)
Please contact pt for future appointment. Pt overdue for f/u. Pt needing refills. 

## 2022-01-18 ENCOUNTER — Other Ambulatory Visit: Payer: Self-pay | Admitting: Nephrology

## 2022-01-18 ENCOUNTER — Other Ambulatory Visit (HOSPITAL_COMMUNITY): Payer: Self-pay | Admitting: Nephrology

## 2022-01-18 DIAGNOSIS — N1832 Chronic kidney disease, stage 3b: Secondary | ICD-10-CM

## 2022-01-18 DIAGNOSIS — R809 Proteinuria, unspecified: Secondary | ICD-10-CM

## 2022-01-18 DIAGNOSIS — I10 Essential (primary) hypertension: Secondary | ICD-10-CM

## 2022-01-18 NOTE — Telephone Encounter (Signed)
Patient is scheduled 8/22 at [facility]

## 2022-01-19 ENCOUNTER — Other Ambulatory Visit: Payer: Self-pay | Admitting: Cardiovascular Disease

## 2022-01-19 ENCOUNTER — Encounter: Payer: Self-pay | Admitting: Pharmacist

## 2022-01-19 ENCOUNTER — Telehealth: Payer: Self-pay | Admitting: Pharmacist

## 2022-01-19 DIAGNOSIS — E785 Hyperlipidemia, unspecified: Secondary | ICD-10-CM

## 2022-01-19 DIAGNOSIS — I10 Essential (primary) hypertension: Secondary | ICD-10-CM

## 2022-01-19 DIAGNOSIS — I2510 Atherosclerotic heart disease of native coronary artery without angina pectoris: Secondary | ICD-10-CM

## 2022-01-19 MED ORDER — REPATHA SURECLICK 140 MG/ML ~~LOC~~ SOAJ: 1.0000 | SUBCUTANEOUS | 11 refills | Status: DC

## 2022-01-19 NOTE — Telephone Encounter (Signed)
Scheduled

## 2022-01-19 NOTE — Telephone Encounter (Signed)
Refill Request.  

## 2022-01-19 NOTE — Telephone Encounter (Signed)
Patient made aware of message below. Advised to get copay card from [email or web address]

## 2022-01-19 NOTE — Telephone Encounter (Signed)
Called pt to let him know that his insurance now covers [name] and not Praluent. LVM for pt to call back. Will also send mychart message.

## 2022-01-25 ENCOUNTER — Other Ambulatory Visit: Payer: Self-pay | Admitting: Radiology

## 2022-01-25 DIAGNOSIS — N179 Acute kidney failure, unspecified: Secondary | ICD-10-CM

## 2022-01-26 ENCOUNTER — Ambulatory Visit (HOSPITAL_COMMUNITY): Admission: RE | Admit: 2022-01-26 | Payer: Managed Care, Other (non HMO) | Source: Ambulatory Visit

## 2022-01-28 ENCOUNTER — Other Ambulatory Visit: Payer: Self-pay | Admitting: Radiology

## 2022-01-29 ENCOUNTER — Other Ambulatory Visit (HOSPITAL_COMMUNITY): Payer: Self-pay | Admitting: Radiology

## 2022-02-01 ENCOUNTER — Other Ambulatory Visit (HOSPITAL_COMMUNITY): Payer: Self-pay | Admitting: Nephrology

## 2022-02-01 ENCOUNTER — Ambulatory Visit (HOSPITAL_COMMUNITY)
Admission: RE | Admit: 2022-02-01 | Discharge: 2022-02-01 | Disposition: A | Discharge status: Home / Self Care | Payer: Managed Care, Other (non HMO) | Source: Ambulatory Visit | Attending: Nephrology | Admitting: Nephrology

## 2022-02-01 ENCOUNTER — Other Ambulatory Visit: Payer: Self-pay

## 2022-02-01 ENCOUNTER — Encounter (HOSPITAL_COMMUNITY): Payer: Self-pay

## 2022-02-01 DIAGNOSIS — I2510 Atherosclerotic heart disease of native coronary artery without angina pectoris: Secondary | ICD-10-CM | POA: Insufficient documentation

## 2022-02-01 DIAGNOSIS — N179 Acute kidney failure, unspecified: Secondary | ICD-10-CM

## 2022-02-01 DIAGNOSIS — E785 Hyperlipidemia, unspecified: Secondary | ICD-10-CM | POA: Diagnosis not present

## 2022-02-01 DIAGNOSIS — I252 Old myocardial infarction: Secondary | ICD-10-CM | POA: Insufficient documentation

## 2022-02-01 DIAGNOSIS — R809 Proteinuria, unspecified: Secondary | ICD-10-CM

## 2022-02-01 DIAGNOSIS — N1832 Chronic kidney disease, stage 3b: Secondary | ICD-10-CM

## 2022-02-01 DIAGNOSIS — I10 Essential (primary) hypertension: Secondary | ICD-10-CM | POA: Diagnosis not present

## 2022-02-01 LAB — CBC
HCT: 39.4 % (ref 39.0–52.0)
Hemoglobin: 12.6 g/dL — ABNORMAL LOW (ref 13.0–17.0)
MCH: 29.9 pg (ref 26.0–34.0)
MCHC: 32 g/dL (ref 30.0–36.0)
MCV: 93.6 fL (ref 80.0–100.0)
Platelets: 211 10*3/uL (ref 150–400)
RBC: 4.21 MIL/uL — ABNORMAL LOW (ref 4.22–5.81)
RDW: 14.8 % (ref 11.5–15.5)
WBC: 10.1 10*3/uL (ref 4.0–10.5)
nRBC: 0 % (ref 0.0–0.2)

## 2022-02-01 LAB — PROTIME-INR
INR: 1 (ref 0.8–1.2)
Prothrombin Time: 12.6 seconds (ref 11.4–15.2)

## 2022-02-01 MED ORDER — HYDRALAZINE HCL 20 MG/ML IJ SOLN
INTRAMUSCULAR | Status: AC
  Filled 2022-02-01: qty 1

## 2022-02-01 MED ORDER — HYDRALAZINE HCL 20 MG/ML IJ SOLN
INTRAMUSCULAR | Status: AC | PRN
  Administered 2022-02-01: 10 mg via INTRAVENOUS

## 2022-02-01 MED ORDER — LIDOCAINE HCL 1 % IJ SOLN: 10.0000 mL | Freq: Once | INTRAMUSCULAR | Status: DC

## 2022-02-01 MED ORDER — SODIUM CHLORIDE 0.9 % IV SOLN: INTRAVENOUS | Status: DC

## 2022-02-01 MED ORDER — FENTANYL CITRATE (PF) 100 MCG/2ML IJ SOLN
INTRAMUSCULAR | Status: AC | PRN
  Administered 2022-02-01: 50 ug via INTRAVENOUS

## 2022-02-01 MED ORDER — MIDAZOLAM HCL 2 MG/2ML IJ SOLN
INTRAMUSCULAR | Status: AC
  Filled 2022-02-01: qty 2

## 2022-02-01 MED ORDER — LIDOCAINE HCL 1 % IJ SOLN
INTRAMUSCULAR | Status: AC
  Filled 2022-02-01: qty 10

## 2022-02-01 MED ORDER — GELATIN ABSORBABLE 12-7 MM EX MISC
CUTANEOUS | Status: AC
  Filled 2022-02-01: qty 1

## 2022-02-01 MED ORDER — FENTANYL CITRATE (PF) 100 MCG/2ML IJ SOLN
INTRAMUSCULAR | Status: AC
  Filled 2022-02-01: qty 2

## 2022-02-01 MED ORDER — HYDRALAZINE HCL 20 MG/ML IJ SOLN
10.0000 mg | Freq: Once | INTRAMUSCULAR | Status: AC
  Administered 2022-02-01: 10 mg via INTRAVENOUS

## 2022-02-01 MED ORDER — MIDAZOLAM HCL 2 MG/2ML IJ SOLN
INTRAMUSCULAR | Status: AC | PRN
  Administered 2022-02-01: 1 mg via INTRAVENOUS

## 2022-02-01 NOTE — Procedures (Signed)
Interventional Radiology Procedure Note  Procedure: CT RENAL CORE BX    Complications: None  Estimated Blood Loss:  MIN  Findings: 18 G X2 OF THE LLP IN SALINE    Tamera Punt, MD

## 2022-02-01 NOTE — H&P (Addendum)
Chief Complaint: Patient was seen in consultation today for proteinuria  Referring Physician(s): Upton,Elizabeth  Supervising Physician: Daryll Brod  Patient Status: [facility] - Out-pt  History of Present Illness: David Cowan is a 61 y.o. male with history of CAD s/p remote NSTEMI, HTN, HLD who has been followed by Nephrology for proteinuria.  He is now referred for kidney biopsy at the request of Dr. Hollie Salk. Case reviewed and approved by Dr.Shick.   David Cowan presents to [facility] Radiology today in his usual state of health.  He has been NPO.  He does not take blood thinners. His daughter will be assisting with transportation and his wife will be available for care at home.   Past Medical History:  Diagnosis Date   Arthritis    Bronchitis    CAD (coronary artery disease)    Dyslipidemia    Hand pain    Hard of hearing    left ear   History of MI (myocardial infarction)    Hyperlipidemia    Hypertension    Myocardial infarction ([place]) 2000   Right flank pain     Past Surgical History:  Procedure Laterality Date   CARDIAC CATHETERIZATION     TOTAL HIP ARTHROPLASTY Left 12/10/2014   Procedure: LEFT TOTAL HIP ARTHROPLASTY ANTERIOR APPROACH;  Surgeon: Paralee Cancel, MD;  Location: [facility] ORS;  Service: Orthopedics;  Laterality: Left;   VASECTOMY      Allergies: Cucumber extract, Amoxicillin, and Penicillins  Medications: Prior to Admission medications   Medication Sig Start Date End Date Taking? Authorizing Provider  Acetaminophen (TYLENOL PO) Take 1 tablet by mouth daily as needed (pain).    [provider]  aspirin 81 MG tablet Take 1 tablet (81 mg total) by mouth daily. 03/21/18   Wellington Hampshire, MD  cetirizine (ZYRTEC) 10 MG tablet Take 10 mg by mouth at bedtime.    [provider]  cilostazol (PLETAL) 100 MG tablet Take 1 tablet (100 mg total) by mouth 2 (two) times daily. KEEP UPCOMING APPOINTMENT FOR FUTURE REFILLS. 01/20/22   Wellington Hampshire, MD   Evolocumab (REPATHA SURECLICK) 852 MG/ML SOAJ Inject 1 Pen into the skin every 14 (fourteen) days. 01/19/22   Evans Lance, MD  fenofibrate 160 MG tablet Take 1 tablet (160 mg total) by mouth daily. Patient taking differently: Take 160 mg by mouth at bedtime. 12/29/21   Wellington Hampshire, MD  fluticasone (FLONASE) 50 MCG/ACT nasal spray Place 2 sprays into both nostrils as needed for allergies or rhinitis. Patient taking differently: Place 2 sprays into both nostrils at bedtime. 03/27/14   [name], MD  metoprolol succinate (TOPROL-XL) 100 MG 24 hr tablet TAKE 1 TABLET BY MOUTH AT BEDTIME. TAKE WITH OR IMMEDIATELY FOLLOWING A MEAL. 01/19/22   Wellington Hampshire, MD  nitroGLYCERIN (NITROSTAT) 0.4 MG SL tablet Place 1 tablet (0.4 mg total) under the tongue every 5 (five) minutes as needed for chest pain. 03/04/20   Wellington Hampshire, MD  PROTEIN PO Take 1 Can by mouth daily. Protein shake    [provider]  rosuvastatin (CRESTOR) 40 MG tablet TAKE 1 TABLET BY MOUTH EVERY DAY Patient taking differently: Take 40 mg by mouth at bedtime. 01/12/22   Wellington Hampshire, MD  triamterene-hydrochlorothiazide (MAXZIDE-25) 37.5-25 MG tablet TAKE 1 TABLET BY MOUTH EVERY DAY 12/24/21   Evans Lance, MD     Family History  Problem Relation Age of Onset   Congestive Heart Failure Mother 70  Lung cancer Mother    Heart attack Father 9   Factor V Leiden deficiency Sister    Factor V Leiden deficiency Brother    Coronary artery disease Other         fam hx, early   Heart attack Other 34       uncle   Colon cancer Neg Hx     Social History   Socioeconomic History   Marital status: Married    Spouse name: Not on file   Number of children: 2   Years of education: Not on file   Highest education level: Not on file  Occupational History   Occupation: SERVICE MANAGER    Employer: [employer]  Tobacco Use   Smoking status: Every Day    Packs/day: 0.75    Years: 40.00    Total  pack years: 30.00    Types: Cigarettes   Smokeless tobacco: Never  Vaping Use   Vaping Use: Never used  Substance and Sexual Activity   Alcohol use: Yes    Comment: once a week at 6 beers   Drug use: Yes    Types: Marijuana    Comment: once a week   Sexual activity: Not on file  Other Topics Concern   Not on file  Social History Narrative   2 daughters      Fulltime: Runner, broadcasting/film/video and part owner   Social Determinants of Health   Financial Resource Strain: Not on file  Food Insecurity: Not on file  Transportation Needs: Not on file  Physical Activity: Not on file  Stress: Not on file  Social Connections: Not on file     Review of Systems: A 12 point ROS discussed and pertinent positives are indicated in the HPI above.  All other systems are negative.  Review of Systems  Constitutional:  Negative for activity change, fatigue and fever.  Respiratory:  Negative for cough and choking.   Gastrointestinal:  Negative for abdominal pain, nausea and vomiting.  Genitourinary:  Negative for dysuria.  Musculoskeletal:  Negative for back pain.  Psychiatric/Behavioral:  Negative for behavioral problems and confusion.     Vital Signs: BP (!) 160/92   Pulse 65   Temp 97.8 F (36.6 C) (Temporal)   Resp (!) 22   Ht '5\' 10"'$  (1.778 m)   Wt 248 lb (112.5 kg)   SpO2 95%   BMI 35.58 kg/m   Physical Exam Vitals and nursing note reviewed.  Constitutional:      General: He is not in acute distress.    Appearance: Normal appearance. He is not ill-appearing.  Cardiovascular:     Rate and Rhythm: Normal rate and regular rhythm.  Pulmonary:     Effort: Pulmonary effort is normal. No respiratory distress.     Breath sounds: Normal breath sounds.  Abdominal:     General: Abdomen is flat. There is no distension.     Palpations: Abdomen is soft.  Skin:    General: Skin is warm and dry.  Neurological:     Mental Status: He is alert. Mental status is at baseline.  Psychiatric:         Mood and Affect: Mood normal.        Behavior: Behavior normal.          Imaging: No results found.  Labs:  CBC: Recent Labs    09/16/21 0926  WBC 9.4  HGB 13.2  HCT 40.1  PLT 211.0    COAGS: No results  for input(s): "INR", "APTT" in the last 8760 hours.  BMP: Recent Labs    09/16/21 0926 10/19/21 1217  NA 141 139  K 4.2 4.0  CL 106 105  CO2 29 23  GLUCOSE 96 116*  BUN 30* 41*  CALCIUM 8.3* 8.6  CREATININE 2.12* 2.43*    LIVER FUNCTION TESTS: Recent Labs    09/16/21 0926  BILITOT 0.2  AST 15  ALT 8  ALKPHOS 34*  PROT 5.3*  ALBUMIN 3.1*    TUMOR MARKERS: No results for input(s): "AFPTM", "CEA", "CA199", "CHROMGRNA" in the last 8760 hours.  Assessment and Plan: Patient with past medical history of CAD, remote NSTEMI, HLD presents with complaint of proteinuria.  IR consulted for random renal biopsy at the request of Dr. Madelon Lips. Case reviewed by Dr. Annamaria Boots who approves patient for procedure.  Patient presents today in their usual state of health.  He has been NPO and is not currently on blood thinners.  BP this AM is 160/92, HR 65.  Per Dr. Annamaria Boots, give IV hydralazine.  '10mg'$  ordered.  Risks and benefits of biopsy was discussed with the patient and/or patient's family including, but not limited to bleeding, infection, damage to adjacent structures or low yield requiring additional tests.  All of the questions were answered and there is agreement to proceed.  Consent signed and in chart.  Thank you for this interesting consult.  I greatly enjoyed meeting David Cowan and look forward to participating in their care.  A copy of this report was sent to the requesting provider on this date.  Electronically Signed: Docia Barrier, PA 02/01/2022, 8:45 AM   I spent a total of  30 Minutes   in face to face in clinical consultation, greater than 50% of which was counseling/coordinating care for proteinuria.

## 2022-02-02 ENCOUNTER — Ambulatory Visit (INDEPENDENT_AMBULATORY_CARE_PROVIDER_SITE_OTHER): Payer: Managed Care, Other (non HMO) | Admitting: Cardiovascular Disease

## 2022-02-02 ENCOUNTER — Encounter: Payer: Self-pay | Admitting: Cardiovascular Disease

## 2022-02-02 VITALS — BP 148/81 | HR 89 | Ht 70.0 in | Wt 239.0 lb

## 2022-02-02 DIAGNOSIS — I10 Essential (primary) hypertension: Secondary | ICD-10-CM

## 2022-02-02 DIAGNOSIS — E782 Mixed hyperlipidemia: Secondary | ICD-10-CM | POA: Diagnosis not present

## 2022-02-02 DIAGNOSIS — I739 Peripheral vascular disease, unspecified: Secondary | ICD-10-CM

## 2022-02-02 DIAGNOSIS — I2510 Atherosclerotic heart disease of native coronary artery without angina pectoris: Secondary | ICD-10-CM

## 2022-02-02 DIAGNOSIS — Z720 Tobacco use: Secondary | ICD-10-CM

## 2022-02-02 MED ORDER — VARENICLINE TARTRATE 1 MG PO TABS
1.0000 mg | ORAL_TABLET | Freq: Two times a day (BID) | ORAL | 1 refills | Status: DC
Start: 2022-02-02 — End: 2022-04-06

## 2022-02-02 MED ORDER — VARENICLINE TARTRATE 0.5 MG X 11 & 1 MG X 42 PO TBPK
ORAL_TABLET | ORAL | 0 refills | Status: DC
Start: 2022-02-02 — End: 2022-03-11

## 2022-02-02 NOTE — Patient Instructions (Signed)
Medication Instructions:  STARt chantix as directed -- there is a starting pack and then 2 months of the continuation pack/dose  *If you need a refill on your cardiac medications before your next appointment, please call your pharmacy*   Follow-Up: At [facility], you and your health needs are our priority.  As part of our continuing mission to provide you with exceptional heart care, we have created designated Provider Care Teams.  These Care Teams include your primary Cardiologist (physician) and Advanced Practice Providers (APPs -  Physician Assistants and Nurse Practitioners) who all work together to provide you with the care you need, when you need it.  We recommend signing up for the patient portal called "MyChart".  Sign up information is provided on this After Visit Summary.  MyChart is used to connect with patients for Virtual Visits (Telemedicine).  Patients are able to view lab/test results, encounter notes, upcoming appointments, etc.  Non-urgent messages can be sent to your provider as well.   To learn more about what you can do with MyChart, go to [email or web address].    Your next appointment:   6 month(s)  The format for your next appointment:   In Person  Provider:   Kathlyn Sacramento, MD {

## 2022-02-02 NOTE — Progress Notes (Signed)
Cardiology Office Note   Date:  02/02/2022   ID:  Kenzel, Ruesch [birth year], MRN [id number]  PCP:  Michela Pitcher, NP  Cardiologist:  [name]  No chief complaint on file.     History of Present Illness: David Cowan is a 61 y.o. male who is here today for follow-up visit regarding peripheral arterial disease.   He has known history of coronary artery disease status post remote non-ST elevation myocardial infarction, hyperlipidemia, tobacco use, hypertension and arthritis. He was seen in 2019 for severe right calf claudication.He underwent noninvasive vascular evaluation which showed an ABI of 0.54 on the right and 0.72 on the left.  Duplex showed occluded right popliteal artery with one-vessel runoff below the knee.  On the left, there was significant proximal SFA stenosis.  He was treated with cilostazol.    Most recent lower extremity arterial Doppler studies in March 2022 showed an ABI of 0.64 on the right and 0.79 on the left.   He has been dealing recently with acute on chronic kidney disease with significant proteinuria.  He had kidney biopsy yesterday.  He reports stable bilateral calf claudication worse on the right than the left.  No rest pain or lower extremity ulceration.  Had symptoms of allergies and upper respiratory tract infections for 2 weeks.  No fever.  He continues to smoke but wants to quit.    Past Medical History:  Diagnosis Date   Arthritis    Bronchitis    CAD (coronary artery disease)    Dyslipidemia    Hand pain    Hard of hearing    left ear   History of MI (myocardial infarction)    Hyperlipidemia    Hypertension    Myocardial infarction ([place]) 2000   Right flank pain     Past Surgical History:  Procedure Laterality Date   CARDIAC CATHETERIZATION     TOTAL HIP ARTHROPLASTY Left 12/10/2014   Procedure: LEFT TOTAL HIP ARTHROPLASTY ANTERIOR APPROACH;  Surgeon: Paralee Cancel, MD;  Location: [facility]S;  Service: Orthopedics;   Laterality: Left;   VASECTOMY       Current Outpatient Medications  Medication Sig Dispense Refill   aspirin 81 MG tablet Take 1 tablet (81 mg total) by mouth daily. 90 tablet 3   cetirizine (ZYRTEC) 10 MG tablet Take 10 mg by mouth at bedtime.     cilostazol (PLETAL) 100 MG tablet Take 1 tablet (100 mg total) by mouth 2 (two) times daily. KEEP UPCOMING APPOINTMENT FOR FUTURE REFILLS. 60 tablet 0   Evolocumab (REPATHA SURECLICK) 035 MG/ML SOAJ Inject 1 Pen into the skin every 14 (fourteen) days. 2 mL 11   fenofibrate 160 MG tablet Take 1 tablet (160 mg total) by mouth daily. (Patient taking differently: Take 160 mg by mouth at bedtime.) 90 tablet 3   fluticasone (FLONASE) 50 MCG/ACT nasal spray Place 2 sprays into both nostrils as needed for allergies or rhinitis. (Patient taking differently: Place 2 sprays into both nostrils at bedtime.) 16 g 3   metoprolol succinate (TOPROL-XL) 100 MG 24 hr tablet TAKE 1 TABLET BY MOUTH AT BEDTIME. TAKE WITH OR IMMEDIATELY FOLLOWING A MEAL. 90 tablet 3   PROTEIN PO Take 1 Can by mouth daily. Protein shake     rosuvastatin (CRESTOR) 40 MG tablet TAKE 1 TABLET BY MOUTH EVERY DAY (Patient taking differently: Take 40 mg by mouth at bedtime.) 30 tablet 0   triamterene-hydrochlorothiazide (MAXZIDE-25) 37.5-25 MG tablet TAKE 1 TABLET  BY MOUTH EVERY DAY 90 tablet 3   Acetaminophen (TYLENOL PO) Take 1 tablet by mouth daily as needed (pain). (Patient not taking: Reported on 02/02/2022)     nitroGLYCERIN (NITROSTAT) 0.4 MG SL tablet Place 1 tablet (0.4 mg total) under the tongue every 5 (five) minutes as needed for chest pain. (Patient not taking: Reported on 02/02/2022) 25 tablet 3   No current facility-administered medications for this visit.    Allergies:   Cucumber extract, Amoxicillin, and Penicillins    Social History:  The patient  reports that he has been smoking cigarettes. He has a 30.00 pack-year smoking history. He has never used smokeless tobacco. He  reports current alcohol use. He reports current drug use. Drug: Marijuana.   Family History:  The patient's family history includes Congestive Heart Failure (age of onset: 64) in his mother; Coronary artery disease in an other family member; Factor V Leiden deficiency in his brother and sister; Heart attack (age of onset: 22) in his father; Heart attack (age of onset: 58) in an other family member; Lung cancer in his mother.    ROS:  Please see the history of present illness.   Otherwise, review of systems are positive for none.   All other systems are reviewed and negative.    PHYSICAL EXAM: VS:  BP (!) 148/81   Pulse 89   Ht '5\' 10"'$  (1.778 m)   Wt 239 lb (108.4 kg)   SpO2 91%   BMI 34.29 kg/m  , BMI Body mass index is 34.29 kg/m. GEN: Well nourished, well developed, in no acute distress  HEENT: normal  Neck: no JVD, carotid bruits, or masses Cardiac: RRR; no murmurs, rubs, or gallops, mild bilateral leg edema Respiratory:  clear to auscultation bilaterally, normal work of breathing GI: soft, nontender, nondistended, + BS MS: no deformity or atrophy  Skin: warm and dry, no rash Neuro:  Strength and sensation are intact Psych: euthymic mood, full affect Vascular: Radial pulses normal bilaterally.   Distal pulses are not palpable.  EKG:  EKG is not ordered today.    Recent Labs: 09/16/2021: ALT 8; TSH 3.73 10/19/2021: BUN 41; Creatinine, Ser 2.43; Potassium 4.0; Sodium 139 02/01/2022: Hemoglobin 12.6; Platelets 211    Lipid Panel    Component Value Date/Time   CHOL 87 (L) 04/14/2021 0930   TRIG 108 04/14/2021 0930   HDL 40 04/14/2021 0930   CHOLHDL 2.2 04/14/2021 0930   CHOLHDL 6.8 (H) 05/17/2016 1037   VLDL 49 (H) 05/17/2016 1037   LDLCALC 27 04/14/2021 0930   LDLDIRECT 95.7 01/05/2013 0946      Wt Readings from Last 3 Encounters:  02/02/22 239 lb (108.4 kg)  02/01/22 248 lb (112.5 kg)  10/19/21 256 lb 9.6 oz (116.4 kg)           No data to display             ASSESSMENT AND PLAN:  1.  Peripheral arterial disease with moderate bilateral calf claudication right worse than left.  This is due to an occluded right popliteal artery with below the knee disease as well.  He also has left SFA disease.  His symptoms are stable on cilostazol 100 mg twice daily.  Continue aggressive medical therapy.  No indication for angiography at the present time especially with recent worsening of renal function.  2.  Tobacco use: He cut down on tobacco use but has not quit completely.  He wants something to help him quit and thus  I prescribed Chantix.  3.  Mixed hyperlipidemia: Taking Praluent but switch recently to Repatha due to insurance reasons but have not started the medication yet.  4.  Essential hypertension: Blood pressure is reasonably controlled.  5.  Coronary artery disease involving native coronary arteries without angina: Stable overall.  Continue medical therapy.   Disposition:   FU with me in 6 months  Signed,  Kathlyn Sacramento, MD  02/02/2022 8:30 AM    [place]

## 2022-02-03 NOTE — Progress Notes (Incomplete)
Test

## 2022-02-04 ENCOUNTER — Encounter (HOSPITAL_COMMUNITY): Payer: Self-pay

## 2022-02-04 ENCOUNTER — Other Ambulatory Visit: Payer: Self-pay | Admitting: Cardiovascular Disease

## 2022-02-05 LAB — SURGICAL PATHOLOGY

## 2022-02-06 ENCOUNTER — Other Ambulatory Visit: Payer: Self-pay | Admitting: Cardiovascular Disease

## 2022-02-10 ENCOUNTER — Other Ambulatory Visit: Payer: Self-pay | Admitting: Cardiovascular Disease

## 2022-03-02 ENCOUNTER — Ambulatory Visit: Payer: Managed Care, Other (non HMO) | Admitting: Family Medicine

## 2022-03-02 ENCOUNTER — Ambulatory Visit (INDEPENDENT_AMBULATORY_CARE_PROVIDER_SITE_OTHER): Payer: Managed Care, Other (non HMO) | Admitting: Family

## 2022-03-02 ENCOUNTER — Encounter: Payer: Self-pay | Admitting: Family

## 2022-03-02 ENCOUNTER — Ambulatory Visit (INDEPENDENT_AMBULATORY_CARE_PROVIDER_SITE_OTHER)
Admission: RE | Admit: 2022-03-02 | Discharge: 2022-03-02 | Disposition: A | Discharge status: Home / Self Care | Payer: Managed Care, Other (non HMO) | Source: Ambulatory Visit | Attending: Family | Admitting: Family

## 2022-03-02 VITALS — BP 136/78 | HR 73 | Temp 98.4°F | Resp 16 | Ht 70.0 in | Wt 242.4 lb

## 2022-03-02 DIAGNOSIS — I2510 Atherosclerotic heart disease of native coronary artery without angina pectoris: Secondary | ICD-10-CM | POA: Diagnosis not present

## 2022-03-02 DIAGNOSIS — R062 Wheezing: Secondary | ICD-10-CM

## 2022-03-02 DIAGNOSIS — J440 Chronic obstructive pulmonary disease with acute lower respiratory infection: Secondary | ICD-10-CM | POA: Diagnosis not present

## 2022-03-02 DIAGNOSIS — I10 Essential (primary) hypertension: Secondary | ICD-10-CM

## 2022-03-02 DIAGNOSIS — E785 Hyperlipidemia, unspecified: Secondary | ICD-10-CM

## 2022-03-02 DIAGNOSIS — J209 Acute bronchitis, unspecified: Secondary | ICD-10-CM | POA: Diagnosis not present

## 2022-03-02 MED ORDER — FLUTICASONE PROPIONATE 50 MCG/ACT NA SUSP: 2.0000 | Freq: Every day | NASAL | Status: AC

## 2022-03-02 MED ORDER — PREDNISONE 20 MG PO TABS: ORAL_TABLET | ORAL | 0 refills | Status: DC

## 2022-03-02 MED ORDER — FENOFIBRATE 160 MG PO TABS: 160.0000 mg | ORAL_TABLET | Freq: Every day | ORAL | Status: DC

## 2022-03-02 MED ORDER — DOXYCYCLINE HYCLATE 100 MG PO TABS: 100.0000 mg | ORAL_TABLET | Freq: Two times a day (BID) | ORAL | 0 refills | Status: AC

## 2022-03-02 NOTE — Patient Instructions (Signed)
  Complete xray(s) prior to leaving today. I will notify you of your results once received.   

## 2022-03-02 NOTE — Assessment & Plan Note (Signed)
Stat cxr today in office revealing bronchitis and COPD.

## 2022-03-02 NOTE — Progress Notes (Signed)
Established Patient Office Visit  Subjective:  Patient ID: David Cowan, male    DOB: 05/23/1961  Age: 61 y.o. MRN: [id number]  CC:  Chief Complaint  Patient presents with  . Cough    X 1 month  . Nasal Congestion    Nose is clear and coughing up mucus is clear    HPI David Cowan is here today with concerns.   States stuffy nose and nasal congestion for the last one month. Felt like it was getting better with some otc mucinex, and symptoms got a little better for a few days but then came back. Some frontal sinus pressure. No ear pain or sore throat. Slight chest congestion, slight sob at times but not often.   8 month old grandson with cough, for a few weeks.   Past Medical History:  Diagnosis Date  . Arthritis   . Bronchitis   . CAD (coronary artery disease)   . Dyslipidemia   . Hand pain   . Hard of hearing    left ear  . History of MI (myocardial infarction)   . Hyperlipidemia   . Hypertension   . Myocardial infarction ([place]) 2000  . Right flank pain     Past Surgical History:  Procedure Laterality Date  . CARDIAC CATHETERIZATION    . 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 Congestive Heart Failure Mother 72  . Lung cancer Mother   . Heart attack Father 40  . Factor V Leiden deficiency Sister   . Factor V Leiden deficiency Brother   . Coronary artery disease Other         fam hx, early  . Heart attack Other 40       uncle  . Colon cancer Neg Hx     Social History   Socioeconomic History  . Marital status: Married    Spouse name: Not on file  . Number of children: 2  . Years of education: Not on file  . Highest education level: Not on file  Occupational History  . Occupation: SERVICE MANAGER    Employer: [employer]   Tobacco Use  . Smoking status: Every Day    Packs/day: 0.75    Years: 40.00    Total pack years: 30.00    Types: Cigarettes  . Smokeless tobacco: Never  Vaping Use  . Vaping Use: Never used  Substance and Sexual Activity  . Alcohol use: Yes    Comment: once a week at 6 beers  . Drug use: Yes    Types: Marijuana    Comment: once a week  . Sexual activity: Not on file  Other Topics Concern  . Not on file  Social History Narrative   2 daughters      Fulltime: Runner, broadcasting/film/video and part owner   Social Determinants of Health   Financial Resource Strain: Not on file  Food Insecurity: Not on file  Transportation Needs: Not on file  Physical Activity: Not on file  Stress: Not on file  Social Connections: Not on file  Intimate Partner Violence: Not on file    Outpatient Medications Prior to Visit  Medication Sig Dispense Refill  . aspirin 81 MG tablet Take 1 tablet (81 mg total) by mouth daily.  90 tablet 3  . cetirizine (ZYRTEC) 10 MG tablet Take 10 mg by mouth at bedtime.    . cilostazol (PLETAL) 100 MG tablet TAKE 1 TABLET BY MOUTH TWICE A DAY 180 tablet 3  . Evolocumab (REPATHA SURECLICK) 458 MG/ML SOAJ Inject 1 Pen into the skin every 14 (fourteen) days. 2 mL 11  . fenofibrate 160 MG tablet Take 1 tablet (160 mg total) by mouth daily. (Patient taking differently: Take 160 mg by mouth at bedtime.) 90 tablet 3  . fluticasone (FLONASE) 50 MCG/ACT nasal spray Place 2 sprays into both nostrils as needed for allergies or rhinitis. (Patient taking differently: Place 2 sprays into both nostrils at bedtime.) 16 g 3  . losartan (COZAAR) 25 MG tablet Take 25 mg by mouth daily.    . metoprolol succinate (TOPROL-XL) 100 MG 24 hr tablet TAKE 1 TABLET BY MOUTH AT BEDTIME. TAKE WITH OR IMMEDIATELY FOLLOWING A MEAL. 90 tablet 3  . nitroGLYCERIN (NITROSTAT) 0.4 MG SL tablet Place 1 tablet (0.4 mg total) under the tongue every 5 (five) minutes as needed for chest pain. 25 tablet 3  . PROTEIN PO Take  1 Can by mouth daily. Protein shake    . rosuvastatin (CRESTOR) 40 MG tablet TAKE 1 TABLET BY MOUTH EVERY DAY 30 tablet 0  . triamterene-hydrochlorothiazide (MAXZIDE-25) 37.5-25 MG tablet TAKE 1 TABLET BY MOUTH EVERY DAY 90 tablet 3  . varenicline (CHANTIX CONTINUING MONTH PAK) 1 MG tablet Take 1 tablet (1 mg total) by mouth 2 (two) times daily. 30 tablet 1  . varenicline (CHANTIX PAK) 0.5 MG X 11 & 1 MG X 42 tablet Take one 0.5 mg tablet by mouth once daily for 3 days, then increase to one 0.5 mg tablet twice daily for 4 days, then increase to one 1 mg tablet twice daily. 53 tablet 0  . Acetaminophen (TYLENOL PO) Take 1 tablet by mouth daily as needed (pain). (Patient not taking: Reported on 02/02/2022)     No facility-administered medications prior to visit.    Allergies  Allergen Reactions  . Cucumber Extract Anaphylaxis and Other (See Comments)    "Black out"  . Amoxicillin Rash  . Penicillins Rash        Objective:    Physical Exam Constitutional:      General: He is awake. He is not in acute distress.    Appearance: Normal appearance. He is obese. He is not ill-appearing.  HENT:     Right Ear: Tympanic membrane normal.     Left Ear: Tympanic membrane normal.     Nose: Nose normal.     Right Turbinates: Not enlarged or swollen.     Left Turbinates: Not enlarged or swollen.     Right Sinus: No maxillary sinus tenderness or frontal sinus tenderness.     Left Sinus: No maxillary sinus tenderness or frontal sinus tenderness.     Mouth/Throat:     Mouth: Mucous membranes are moist.     Pharynx: No pharyngeal swelling, oropharyngeal exudate or posterior oropharyngeal erythema.  Eyes:     Extraocular Movements: Extraocular movements intact.     Pupils: Pupils are equal, round, and reactive to light.  Cardiovascular:     Rate and Rhythm: Normal rate and regular rhythm.  Pulmonary:     Effort: Pulmonary effort is normal.     Breath sounds: Normal breath sounds. No wheezing.   Neurological:     Mental Status: He is alert.    BP 136/78   Pulse  73   Temp 98.4 F (36.9 C)   Resp 16   Ht $R'5\' 10"'hZ$  (1.778 m)   Wt 242 lb 6 oz (109.9 kg)   SpO2 94%   BMI 34.78 kg/m  Wt Readings from Last 3 Encounters:  03/02/22 242 lb 6 oz (109.9 kg)  02/02/22 239 lb (108.4 kg)  02/01/22 248 lb (112.5 kg)     Health Maintenance Due  Topic Date Due  . HIV Screening  Never done  . Hepatitis C Screening  Never done  . Zoster Vaccines- Shingrix (1 of 2) Never done  . COVID-19 Vaccine (3 - Pfizer risk series) 09/26/2019  . INFLUENZA VACCINE  Never done    There are no preventive care reminders to display for this patient.  Lab Results  Component Value Date   TSH 3.73 09/16/2021   Lab Results  Component Value Date   WBC 10.1 02/01/2022   HGB 12.6 (L) 02/01/2022   HCT 39.4 02/01/2022   MCV 93.6 02/01/2022   PLT 211 02/01/2022   Lab Results  Component Value Date   NA 139 10/19/2021   K 4.0 10/19/2021   CO2 23 10/19/2021   GLUCOSE 116 (H) 10/19/2021   BUN 41 (H) 10/19/2021   CREATININE 2.43 (H) 10/19/2021   BILITOT 0.2 09/16/2021   ALKPHOS 34 (L) 09/16/2021   AST 15 09/16/2021   ALT 8 09/16/2021   PROT 5.3 (L) 09/16/2021   ALBUMIN 3.1 (L) 09/16/2021   CALCIUM 8.6 10/19/2021   ANIONGAP 12 08/09/2020   EGFR 30 (L) 10/19/2021   GFR 33.11 (L) 09/16/2021   Lab Results  Component Value Date   HGBA1C 6.0 09/16/2021      Assessment & Plan:   Problem List Items Addressed This Visit   None   No orders of the defined types were placed in this encounter.   Follow-up: No follow-ups on file.    Eugenia Pancoast, FNP

## 2022-03-02 NOTE — Progress Notes (Signed)
Please advise pt chest xray with copd and bronchitis.  Sent in antbx and steroid to start. F/u one week with matt in office.  He may also consider starting on daily inhaler if necessary for copd.

## 2022-03-03 ENCOUNTER — Other Ambulatory Visit: Payer: Self-pay | Admitting: Cardiovascular Disease

## 2022-03-03 DIAGNOSIS — J209 Acute bronchitis, unspecified: Secondary | ICD-10-CM | POA: Insufficient documentation

## 2022-03-03 NOTE — Telephone Encounter (Signed)
Please review

## 2022-03-03 NOTE — Assessment & Plan Note (Signed)
RX doxycycline 100 mg po bid x 10 days rx prednisone 40 mg once daily x 5 days Take antibiotic as prescribed. Increase oral fluids. Pt to f/u if sx worsen and or fail to improve in 2-3 days.  Advised pt may consider starting on inhaler for copd once f/u with matt and bronchitis resolved.

## 2022-03-03 NOTE — Assessment & Plan Note (Signed)
Refilled fenofibrate 160 mg twice daily Patient advised to work on low-cholesterol diet and exercise as tolerated

## 2022-03-11 ENCOUNTER — Encounter: Payer: Self-pay | Admitting: Nurse Practitioner

## 2022-03-11 ENCOUNTER — Ambulatory Visit (INDEPENDENT_AMBULATORY_CARE_PROVIDER_SITE_OTHER): Payer: Managed Care, Other (non HMO) | Admitting: Nurse Practitioner

## 2022-03-11 VITALS — BP 138/66 | HR 83 | Temp 96.9°F | Resp 18 | Ht 70.0 in | Wt 244.4 lb

## 2022-03-11 DIAGNOSIS — J449 Chronic obstructive pulmonary disease, unspecified: Secondary | ICD-10-CM

## 2022-03-11 MED ORDER — ALBUTEROL SULFATE HFA 108 (90 BASE) MCG/ACT IN AERS: 2.0000 | INHALATION_SPRAY | Freq: Four times a day (QID) | RESPIRATORY_TRACT | 0 refills | Status: AC | PRN

## 2022-03-11 NOTE — Assessment & Plan Note (Signed)
Noticed on chest x-ray in office.  Patient was referred to low-dose CT scan but was unable to get it done as he had to have a biopsy of his kidney per his report.  Gave patient information to have low-dose CT scan performed.  Discussed inhaler use patient does not want a maintenance inhaler at this juncture.  We will give an albuterol Hailer to use as needed.  Lungs were clear to auscultation but diminished.

## 2022-03-11 NOTE — Patient Instructions (Addendum)
Nice to see you today I sent in an albuterol inhaler that you can use when you need it.  If you are using it 2-3 times a day let me know and we need to start a daily inhaler Follow up with me in 3 months for a recheck, sooner if you need me  Call this number to get that lung scan set up please  [facility] Address: [address], [place], [place] [address]   Phone: [phone]

## 2022-03-11 NOTE — Progress Notes (Signed)
   Established Patient Office Visit  Subjective   Patient ID: ELIYAS SUDDRETH, male    DOB: 06/27/1960  Age: 61 y.o. MRN: [id number]  Chief Complaint  Patient presents with   Follow-up    On results from chest xray-showed COPD and Bronchitis. Feels better but still has some wheezing    HPI  URI/COPD: Patient was seen by a collegaue on 03/02/2022 for an acute visit CXR was obtained and showed bronchitis and COPD. Patient is an every day smoker with a 30 pack year history   Patient is here for follow up States that he has finished up the prednisone and still has some doxycycline left. States that he has been on chantix for approx 3 weeks and seems to be helping.  States that he is smoking approx 0.5 packs a day has decreased some.   States that he does get shortness of breath with certain activities.  But overall improved since starting medications.    Review of Systems  Constitutional:  Negative for chills and fever.  Respiratory:  Positive for cough and wheezing. Negative for shortness of breath.   Cardiovascular:  Negative for chest pain.      Objective:     BP 138/66   Pulse 83   Temp (!) 96.9 F (36.1 C) (Temporal)   Resp 18   Ht '5\' 10"'$  (1.778 m)   Wt 244 lb 6 oz (110.8 kg)   SpO2 95%   BMI 35.06 kg/m    Physical Exam Constitutional:      Appearance: Normal appearance.  Cardiovascular:     Rate and Rhythm: Normal rate and regular rhythm.     Heart sounds: Normal heart sounds.  Pulmonary:     Effort: Pulmonary effort is normal. No respiratory distress.     Breath sounds: No wheezing or rhonchi.     Comments: Decreased globally  Neurological:     Mental Status: He is alert.      No results found for any visits on 03/11/22.    The ASCVD Risk score (Arnett DK, et al., 2019) failed to calculate for the following reasons:   The patient has a prior MI or stroke diagnosis    Assessment & Plan:   Problem List Items Addressed This Visit       Respiratory    Chronic obstructive pulmonary disease ([place]) - Primary    Noticed on chest x-ray in office.  Patient was referred to low-dose CT scan but was unable to get it done as he had to have a biopsy of his kidney per his report.  Gave patient information to have low-dose CT scan performed.  Discussed inhaler use patient does not want a maintenance inhaler at this juncture.  We will give an albuterol Hailer to use as needed.  Lungs were clear to auscultation but diminished.      Relevant Medications   albuterol (VENTOLIN HFA) 108 (90 Base) MCG/ACT inhaler    Return in about 3 months (around 06/10/2022) for COPD recheck .    Romilda Garret, NP

## 2022-03-16 ENCOUNTER — Other Ambulatory Visit: Payer: Self-pay | Admitting: *Deleted

## 2022-03-16 DIAGNOSIS — Z122 Encounter for screening for malignant neoplasm of respiratory organs: Secondary | ICD-10-CM

## 2022-03-16 DIAGNOSIS — F17210 Nicotine dependence, cigarettes, uncomplicated: Secondary | ICD-10-CM

## 2022-03-16 DIAGNOSIS — Z87891 Personal history of nicotine dependence: Secondary | ICD-10-CM

## 2022-04-04 ENCOUNTER — Other Ambulatory Visit: Payer: Self-pay | Admitting: Cardiovascular Disease

## 2022-04-06 ENCOUNTER — Telehealth: Payer: Self-pay | Admitting: Cardiovascular Disease

## 2022-04-06 MED ORDER — VARENICLINE TARTRATE 1 MG PO TABS: 1.0000 mg | ORAL_TABLET | Freq: Two times a day (BID) | ORAL | 0 refills | Status: DC

## 2022-04-06 NOTE — Telephone Encounter (Signed)
Spoke with patient of Dr. Fletcher Anon re: Chantix   Last cigarette was on 10/15. He did smoke some on the starter pack  He cannot remember exactly when he started the medication but thinks it was later than prescribed (8/22) The 8/22 prescription was only for 30 tabs, which was 2 weeks of med He said his bottle of '1mg'$  tablets indicates there is 1 refill but he is unsure about this  [facility] - he picked [facility] 30 tabs of the '1mg'$  on 8/22 and is due for a fill now He did pick up starting dose prior to 8/22 Changed Rx of '1mg'$  tab BID to #60 w/0 refills Appears he will be short 2 weeks of med therapy but he is hopeful he will not need thins  Advised to call towards end of his next fill/pill bottle if he needs the additional 2 weeks that he was short on

## 2022-04-06 NOTE — Telephone Encounter (Signed)
*  STAT* If patient is at the pharmacy, call can be transferred to refill team.   1. Which medications need to be refilled? (please list name of each medication and dose if known) varenicline (CHANTIX CONTINUING MONTH PAK) 1 MG tablet  2. Which pharmacy/location (including street and city if local pharmacy) is medication to be sent to? [id number] [place], [place] - [address]  3. Do they need a 30 day or 90 day supply? [address]

## 2022-04-06 NOTE — Telephone Encounter (Signed)
Attempted to call patient, left message for patient to call back to office.   

## 2022-04-06 NOTE — Telephone Encounter (Signed)
Please see below.

## 2022-04-06 NOTE — Telephone Encounter (Signed)
Patient returned call

## 2022-04-06 NOTE — Telephone Encounter (Signed)
Left a message for the patient to call back. The chantix continuing pack should be taken for two months. This was started in August.

## 2022-04-06 NOTE — Telephone Encounter (Signed)
Pt is returning call. Requesting call back.  

## 2022-04-12 ENCOUNTER — Other Ambulatory Visit: Payer: Self-pay | Admitting: Nurse Practitioner

## 2022-04-12 DIAGNOSIS — J449 Chronic obstructive pulmonary disease, unspecified: Secondary | ICD-10-CM

## 2022-04-13 NOTE — Telephone Encounter (Signed)
Patient states he does not need this. He has not used the first prescription yet. Advised we will deny this RX

## 2022-04-20 ENCOUNTER — Encounter: Payer: Self-pay | Admitting: Acute Care

## 2022-04-20 ENCOUNTER — Ambulatory Visit
Admission: RE | Admit: 2022-04-20 | Discharge: 2022-04-20 | Disposition: A | Discharge status: Home / Self Care | Payer: Managed Care, Other (non HMO) | Source: Ambulatory Visit | Attending: Acute Care | Admitting: Acute Care

## 2022-04-20 ENCOUNTER — Ambulatory Visit (INDEPENDENT_AMBULATORY_CARE_PROVIDER_SITE_OTHER): Payer: Managed Care, Other (non HMO) | Admitting: Acute Care

## 2022-04-20 DIAGNOSIS — Z122 Encounter for screening for malignant neoplasm of respiratory organs: Secondary | ICD-10-CM

## 2022-04-20 DIAGNOSIS — Z87891 Personal history of nicotine dependence: Secondary | ICD-10-CM

## 2022-04-20 DIAGNOSIS — F17210 Nicotine dependence, cigarettes, uncomplicated: Secondary | ICD-10-CM

## 2022-04-20 NOTE — Progress Notes (Signed)
Virtual Visit via Telephone Note  I connected with David Cowan on 04/20/22 at  8:30 AM EST by telephone and verified that I am speaking with the correct person using two identifiers.  Location: Patient: At home Provider: [place], [place], [place], [address]    I discussed the limitations, risks, security and privacy concerns of performing an evaluation and management service by telephone and the availability of in person appointments. I also discussed with the patient that there may be a patient responsible charge related to this service. The patient expressed understanding and agreed to proceed.      David Spatz, NP Shared Decision Making Visit Lung Cancer Screening Program [phone])   Eligibility: Age 61 y.o. Pack Years Smoking History Calculation 49 pack year smoking history (# packs/per year x # years smoked) Recent History of coughing up blood  no Unexplained weight loss? no ( >Than 15 pounds within the last 6 months ) Prior History Lung / other cancer no (Diagnosis within the last 5 years already requiring surveillance chest CT Scans). Smoking Status Former Smoker Former Smokers: Years since quit: < 1 year  Quit Date: 03/2022  Visit Components: Discussion included one or more decision making aids. yes Discussion included risk/benefits of screening. yes Discussion included potential follow up diagnostic testing for abnormal scans. yes Discussion included meaning and risk of over diagnosis. yes Discussion included meaning and risk of False Positives. yes Discussion included meaning of total radiation exposure. yes  Counseling Included: Importance of adherence to annual lung cancer LDCT screening. yes Impact of comorbidities on ability to participate in the program. yes Ability and willingness to under diagnostic treatment. yes  Smoking Cessation Counseling: Current Smokers:  Discussed importance of smoking cessation. yes Information about tobacco  cessation classes and interventions provided to patient. yes Patient provided with "ticket" for LDCT Scan. yes Symptomatic Patient. no  Counseling NA Diagnosis Code: Tobacco Use Z72.0 Asymptomatic Patient yes  Counseling (Intermediate counseling: > three minutes counseling) W9675 Former Smokers:  Discussed the importance of maintaining cigarette abstinence. yes Diagnosis Code: Personal History of Nicotine Dependence. F16.384 Information about tobacco cessation classes and interventions provided to patient. Yes Patient provided with "ticket" for LDCT Scan. yes Written Order for Lung Cancer Screening with LDCT placed in Epic. Yes (CT Chest Lung Cancer Screening Low Dose W/O CM) [id number] Z12.2-Screening of respiratory organs Z87.891-Personal history of nicotine dependence  I spent 25 minutes of face to face time/virtual visit time  with  David Cowan discussing the risks and benefits of lung cancer screening. We took the time to pause the power point at intervals to allow for questions to be asked and answered to ensure understanding. We discussed that he had taken the single most powerful action possible to decrease his risk of developing lung cancer when he quit smoking. I counseled him to remain smoke free, and to contact me if he ever had the desire to smoke again so that I can provide resources and tools to help support the effort to remain smoke free. We discussed the time and location of the scan, and that either  David Glassman RN, David Prince, RN or I  or I will call / send a letter with the results within  24-72 hours of receiving them. He has the office contact information in the event he needs to speak with me,  he verbalized understanding of all of the above and had no further questions upon leaving the office.     I  explained to the patient that there has been a high incidence of coronary artery disease noted on these exams. I explained that this is a non-gated exam therefore degree or  severity cannot be determined. This patient is on statin therapy. I have asked the patient to follow-up with their PCP regarding any incidental finding of coronary artery disease and management with diet or medication as they feel is clinically indicated. The patient verbalized understanding of the above and had no further questions.     David Spatz, NP 04/20/2022

## 2022-04-20 NOTE — Patient Instructions (Signed)
Thank you for participating in the [facility] Screening Program. It was our pleasure to meet you today. We will call you with the results of your scan within the next few days. Your scan will be assigned a Lung RADS category score by the physicians reading the scans.  This Lung RADS score determines follow up scanning.  See below for description of categories, and follow up screening recommendations. We will be in touch to schedule your follow up screening annually or based on recommendations of our providers. We will fax a copy of your scan results to your Primary Care Physician, or the physician who referred you to the program, to ensure they have the results. Please call the office if you have any questions or concerns regarding your scanning experience or results.  Our office number is [phone]. Please speak with Denise Phelps, RN. , or  Denise Buckner RN, They are  our Lung Cancer Screening RN.'s If They are unavailable when you call, Please leave a message on the voice mail. We will return your call at our earliest convenience.This voice mail is monitored several times a day.  Remember, if your scan is normal, we will scan you annually as long as you continue to meet the criteria for the program. (Age 55-77, Current smoker or smoker who has quit within the last 15 years). If you are a smoker, remember, quitting is the single most powerful action that you can take to decrease your risk of lung cancer and other pulmonary, breathing related problems. We know quitting is hard, and we are here to help.  Please let us know if there is anything we can do to help you meet your goal of quitting. If you are a former smoker, congratulations. We are proud of you! Remain smoke free! Remember you can refer friends or family members through the number above.  We will screen them to make sure they meet criteria for the program. Thank you for helping us take better care of you by  participating in Lung Screening.  You can receive free nicotine replacement therapy ( patches, gum or mints) by calling 1-800-QUIT NOW. Please call so we can get you on the path to becoming  a non-smoker. I know it is hard, but you can do this!  Lung RADS Categories:  Lung RADS 1: no nodules or definitely non-concerning nodules.  Recommendation is for a repeat annual scan in 12 months.  Lung RADS 2:  nodules that are non-concerning in appearance and behavior with a very low likelihood of becoming an active cancer. Recommendation is for a repeat annual scan in 12 months.  Lung RADS 3: nodules that are probably non-concerning , includes nodules with a low likelihood of becoming an active cancer.  Recommendation is for a 6-month repeat screening scan. Often noted after an upper respiratory illness. We will be in touch to make sure you have no questions, and to schedule your 6-month scan.  Lung RADS 4 A: nodules with concerning findings, recommendation is most often for a follow up scan in 3 months or additional testing based on our provider's assessment of the scan. We will be in touch to make sure you have no questions and to schedule the recommended 3 month follow up scan.  Lung RADS 4 B:  indicates findings that are concerning. We will be in touch with you to schedule additional diagnostic testing based on our provider's  assessment of the scan.  Other options for assistance in smoking cessation (   As covered by your insurance benefits)  Hypnosis for smoking cessation  [employer] [phone]  Acupuncture for smoking cessation  [facility] [phone]   

## 2022-04-21 ENCOUNTER — Ambulatory Visit (HOSPITAL_COMMUNITY): Payer: Managed Care, Other (non HMO)

## 2022-04-23 ENCOUNTER — Other Ambulatory Visit: Payer: Self-pay

## 2022-04-23 ENCOUNTER — Telehealth: Payer: Self-pay | Admitting: Acute Care

## 2022-04-23 DIAGNOSIS — Z87891 Personal history of nicotine dependence: Secondary | ICD-10-CM

## 2022-04-23 DIAGNOSIS — R911 Solitary pulmonary nodule: Secondary | ICD-10-CM

## 2022-04-23 NOTE — Telephone Encounter (Signed)
Spoke with patient by phone, using two patient identifiers, to review results of LDCT.  Pulmonary nodules noted with largest nodule noted as needing follow up in 6 months.  Advised there was no comparison imaging and though nodule likely benign, it is recommended for follow up imaging sooner than the annual LDCT.  Patient states he did have a very bad bronchitis a few months ago that his provider was worried about it being pneumonia.  Also noted was emphysema and atherosclerosis. Patient is on statin med.  Patient is in agreement to have the 6 month follow up CT. Order placed and results/plan faxed to PCP. Patient had no further questions.

## 2022-05-01 ENCOUNTER — Other Ambulatory Visit: Payer: Self-pay | Admitting: Cardiovascular Disease

## 2022-05-03 NOTE — Telephone Encounter (Signed)
Refill Request.  

## 2022-05-12 LAB — BASIC METABOLIC PANEL
BUN: 64 — AB (ref 4–21)
CO2: 23 — AB (ref 13–22)
Chloride: 104 (ref 99–108)
Creatinine: 3.3 — AB (ref 0.6–1.3)
Glucose: 94
Potassium: 4.7 mEq/L (ref 3.5–5.1)
Sodium: 138 (ref 137–147)

## 2022-05-12 LAB — COMPREHENSIVE METABOLIC PANEL
Albumin: 3.6 (ref 3.5–5.0)
Calcium: 8.9 (ref 8.7–10.7)
eGFR: 20

## 2022-05-21 LAB — BASIC METABOLIC PANEL
BUN: 71 — AB (ref 4–21)
CO2: 23 — AB (ref 13–22)
Chloride: 104 (ref 99–108)
Creatinine: 3.4 — AB (ref 0.6–1.3)
Glucose: 119
Potassium: 4.9 mEq/L (ref 3.5–5.1)
Sodium: 137 (ref 137–147)

## 2022-05-21 LAB — PROTEIN / CREATININE RATIO, URINE
Albumin, U: 2945.1
Creatinine, Urine: 70.9

## 2022-05-21 LAB — COMPREHENSIVE METABOLIC PANEL
Albumin: 3.7 (ref 3.5–5.0)
Calcium: 8.7 (ref 8.7–10.7)
eGFR: 20

## 2022-05-21 LAB — MICROALBUMIN / CREATININE URINE RATIO: Microalb Creat Ratio: 4154

## 2022-05-28 ENCOUNTER — Encounter: Payer: Self-pay | Admitting: Nurse Practitioner

## 2022-06-02 ENCOUNTER — Other Ambulatory Visit: Payer: Self-pay | Admitting: Internal Medicine

## 2022-06-02 ENCOUNTER — Telehealth: Payer: Self-pay | Admitting: Nurse Practitioner

## 2022-06-02 DIAGNOSIS — I10 Essential (primary) hypertension: Secondary | ICD-10-CM

## 2022-06-02 NOTE — Telephone Encounter (Signed)
NO,I was going to send him to access nurse,but I was advised to just send a message back to the pool for advice.

## 2022-06-02 NOTE — Telephone Encounter (Signed)
Patient called in stating that he has had cold symptoms since last Friday,and he has been taking mucinex and tylenol,but he's still having the cough and cold. He has not been running a fever. He would like advice as to how do he know if he's contagious or not?

## 2022-06-02 NOTE — Telephone Encounter (Signed)
FYI Patient called back,and is going to try and be seen through a telehealth video visit.

## 2022-06-10 ENCOUNTER — Encounter: Payer: Self-pay | Admitting: Nurse Practitioner

## 2022-06-10 ENCOUNTER — Ambulatory Visit (INDEPENDENT_AMBULATORY_CARE_PROVIDER_SITE_OTHER): Payer: Managed Care, Other (non HMO) | Admitting: Nurse Practitioner

## 2022-06-10 VITALS — BP 130/78 | HR 81 | Temp 98.2°F | Resp 16 | Ht 70.0 in | Wt 256.1 lb

## 2022-06-10 DIAGNOSIS — J069 Acute upper respiratory infection, unspecified: Secondary | ICD-10-CM | POA: Diagnosis not present

## 2022-06-10 DIAGNOSIS — J431 Panlobular emphysema: Secondary | ICD-10-CM

## 2022-06-10 DIAGNOSIS — R051 Acute cough: Secondary | ICD-10-CM | POA: Diagnosis not present

## 2022-06-10 MED ORDER — DOXYCYCLINE HYCLATE 100 MG PO TABS: 100.0000 mg | ORAL_TABLET | Freq: Two times a day (BID) | ORAL | 0 refills | Status: AC

## 2022-06-10 MED ORDER — GUAIFENESIN-CODEINE 100-10 MG/5ML PO SOLN: 5.0000 mL | Freq: Three times a day (TID) | ORAL | 0 refills | Status: DC | PRN

## 2022-06-10 NOTE — Patient Instructions (Signed)
Nice to see you today I have sent in cough medication and an antibiotic. The cough medication can make you sleepy Follow up in about 5 months for you physical, sooner if you need me

## 2022-06-10 NOTE — Assessment & Plan Note (Signed)
Continue using albuterol inhaler as needed patient has stopped smoking for approximately 3 months.

## 2022-06-10 NOTE — Assessment & Plan Note (Signed)
Will treat patient with doxycycline 100 mg twice daily for 7 days.  Follow-up if no improvement.

## 2022-06-10 NOTE — Progress Notes (Signed)
Established Patient Office Visit  Subjective   Patient ID: David Cowan, male    DOB: [birth year]  Age: 61 y.o. MRN: [id number]  Chief Complaint  Patient presents with   COPD    Follow up       COPD: Last seen on 03/11/2022. Declined a mainatiance inhaler but did start on an alubterol inhaler. He is here for follow up.  States that he felt like his breathing is doing okay.  Patient has not used albuterol inhaler [place] per his report.  Sick symptoms: States that he did a tele health visit last week Symptoms started 05/28/2022 States that his wife did not feel good for a couple days Covid test at home tha was negative Given some singular and atrovent nasal spray. States that he does not feel like he has had great relief.   Review of Systems  Constitutional:  Positive for malaise/fatigue. Negative for chills and fever.  HENT:  Negative for ear discharge, ear pain, sinus pain and sore throat.   Respiratory:  Positive for cough and sputum production. Negative for shortness of breath and wheezing.   Gastrointestinal:  Positive for diarrhea. Negative for abdominal pain, nausea and vomiting.  Musculoskeletal:  Negative for joint pain and myalgias.  Neurological:  Negative for headaches.      Objective:     BP 130/78   Pulse 81   Temp 98.2 F (36.8 C)   Resp 16   Ht '5\' 10"'$  (1.778 m)   Wt 256 lb 2 oz (116.2 kg)   SpO2 95%   BMI 36.75 kg/m  BP Readings from Last 3 Encounters:  06/10/22 130/78  03/11/22 138/66  03/02/22 136/78   Wt Readings from Last 3 Encounters:  06/10/22 256 lb 2 oz (116.2 kg)  03/11/22 244 lb 6 oz (110.8 kg)  03/02/22 242 lb 6 oz (109.9 kg)      Physical Exam Vitals and nursing note reviewed.  Constitutional:      Appearance: Normal appearance. He is obese.  HENT:     Right Ear: Tympanic membrane, ear canal and external ear normal.     Left Ear: Tympanic membrane, ear canal and external ear normal.     Nose:     Right Sinus: No maxillary  sinus tenderness or frontal sinus tenderness.     Left Sinus: No maxillary sinus tenderness or frontal sinus tenderness.     Mouth/Throat:     Mouth: Mucous membranes are moist.     Pharynx: Oropharynx is clear.  Cardiovascular:     Rate and Rhythm: Normal rate and regular rhythm.     Heart sounds: Normal heart sounds.  Pulmonary:     Comments: Decreased in lower lobes Abdominal:     General: Bowel sounds are normal.  Lymphadenopathy:     Cervical: No cervical adenopathy.  Neurological:     Mental Status: He is alert.      No results found for any visits on 06/10/22.    The ASCVD Risk score (Arnett DK, et al., 2019) failed to calculate for the following reasons:   The patient has a prior MI or stroke diagnosis    Assessment & Plan:   Problem List Items Addressed This Visit       Respiratory   Chronic obstructive pulmonary disease ([place])    Continue using albuterol inhaler as needed patient has stopped smoking for approximately 3 months.      Relevant Medications   ipratropium (ATROVENT) 0.03 % nasal spray  guaiFENesin-codeine 100-10 MG/5ML syrup   Upper respiratory tract infection - Primary    Will treat patient with doxycycline 100 mg twice daily for 7 days.  Follow-up if no improvement.      Relevant Medications   doxycycline (VIBRA-TABS) 100 MG tablet     Other   Acute cough    Codeine-guaifenesin cough syrup 5 mL 3 times daily as needed.  Sedation precautions reviewed      Relevant Medications   guaiFENesin-codeine 100-10 MG/5ML syrup    Return in about 5 months (around 11/09/2022) for CPE and Labs.    Romilda Garret, NP

## 2022-06-10 NOTE — Assessment & Plan Note (Signed)
Codeine-guaifenesin cough syrup 5 mL 3 times daily as needed.  Sedation precautions reviewed

## 2022-07-09 ENCOUNTER — Telehealth: Payer: Self-pay | Admitting: Pharmacist

## 2022-07-09 NOTE — Telephone Encounter (Signed)
[facility] that new [facility] PA is needed. This has been submitted, Key: [id number].

## 2022-07-12 NOTE — Telephone Encounter (Signed)
Additional fax questions sent to our office, these have been submitted today.

## 2022-07-14 MED ORDER — REPATHA SURECLICK 140 MG/ML ~~LOC~~ SOAJ: 1.0000 | SUBCUTANEOUS | 11 refills | Status: DC

## 2022-07-14 NOTE — Telephone Encounter (Addendum)
Prior auth denied. No clear reason why as pt meets all of their requirements. Called his insurance. They stated they see an approval through 09/11/23, sounds like they must have realized their error and corrected it. They refaxed over approval letter which has been scanned into Media tab.

## 2022-07-20 ENCOUNTER — Ambulatory Visit: Payer: BC Managed Care – PPO | Admitting: Cardiovascular Disease

## 2022-08-05 DIAGNOSIS — I10 Essential (primary) hypertension: Secondary | ICD-10-CM | POA: Diagnosis not present

## 2022-08-05 DIAGNOSIS — N051 Unspecified nephritic syndrome with focal and segmental glomerular lesions: Secondary | ICD-10-CM | POA: Diagnosis not present

## 2022-08-05 DIAGNOSIS — E785 Hyperlipidemia, unspecified: Secondary | ICD-10-CM | POA: Diagnosis not present

## 2022-08-05 DIAGNOSIS — N1832 Chronic kidney disease, stage 3b: Secondary | ICD-10-CM | POA: Diagnosis not present

## 2022-08-05 LAB — COMPREHENSIVE METABOLIC PANEL
Albumin: 3.6 (ref 3.5–5.0)
Calcium: 8.2 — AB (ref 8.7–10.7)
eGFR: 18

## 2022-08-05 LAB — BASIC METABOLIC PANEL
BUN: 62 — AB (ref 4–21)
Chloride: 107 (ref 99–108)
Creatinine: 3.8 — AB (ref 0.6–1.3)
Glucose: 102
Potassium: 4.2 mEq/L (ref 3.5–5.1)
Sodium: 142 (ref 137–147)

## 2022-08-05 LAB — MICROALBUMIN / CREATININE URINE RATIO: Microalb Creat Ratio: 4328

## 2022-08-05 LAB — PROTEIN / CREATININE RATIO, URINE
Albumin, U: 4106.8
Creatinine, Urine: 94.9

## 2022-10-08 ENCOUNTER — Ambulatory Visit: Payer: BC Managed Care – PPO | Attending: Cardiovascular Disease | Admitting: Cardiovascular Disease

## 2022-10-08 ENCOUNTER — Encounter: Payer: Self-pay | Admitting: Cardiovascular Disease

## 2022-10-08 VITALS — BP 138/60 | HR 81 | Ht 70.5 in | Wt 258.2 lb

## 2022-10-08 DIAGNOSIS — E782 Mixed hyperlipidemia: Secondary | ICD-10-CM

## 2022-10-08 DIAGNOSIS — I739 Peripheral vascular disease, unspecified: Secondary | ICD-10-CM

## 2022-10-08 DIAGNOSIS — I2510 Atherosclerotic heart disease of native coronary artery without angina pectoris: Secondary | ICD-10-CM

## 2022-10-08 DIAGNOSIS — I10 Essential (primary) hypertension: Secondary | ICD-10-CM

## 2022-10-08 NOTE — Progress Notes (Signed)
Cardiology Office Note   Date:  10/08/2022   ID:  David Cowan, David Cowan Jul 19, 1960, MRN [id number]  PCP:  [name], NP  Cardiologist:  Dr. Ladona Ridgel  Chief Complaint  Patient presents with   Follow-up    6 month fu pt would like to discuss labs. Meds reviewed verbally with pt.      History of Present Illness: David Cowan is a 62 y.o. male who is here today for follow-up visit regarding peripheral arterial disease.   He has known history of coronary artery disease status post remote non-ST elevation myocardial infarction, hyperlipidemia, tobacco use, hypertension and arthritis. He was seen in 2019 for severe right calf claudication.He underwent noninvasive vascular evaluation which showed an ABI of 0.54 on the right and 0.72 on the left.  Duplex showed occluded right popliteal artery with one-vessel runoff below the knee.  On the left, there was significant proximal SFA stenosis.  He was treated with cilostazol.    Most recent lower extremity arterial Doppler studies in March 2022 showed an ABI of 0.64 on the right and 0.79 on the left.   He quit smoking last year.  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  He follows with nephrology.  He reports stable bilateral calf claudication.  No chest pain or shortness of breath.   Past Medical History:  Diagnosis Date   Arthritis    Bronchitis    CAD (coronary artery disease)    Dyslipidemia    Hand pain    Hard of hearing    left ear   History of MI (myocardial infarction)    Hyperlipidemia    Hypertension    Myocardial infarction (HCC) 2000   Right flank pain     Past Surgical History:  Procedure Laterality Date   CARDIAC CATHETERIZATION     TOTAL HIP ARTHROPLASTY Left 12/10/2014   Procedure: LEFT TOTAL HIP ARTHROPLASTY ANTERIOR APPROACH;  Surgeon: [name], MD;  Location: [facility];  Service: Orthopedics;  Laterality: Left;   VASECTOMY        Current Outpatient Medications  Medication Sig Dispense Refill   albuterol (VENTOLIN HFA) 108 (90 Base) MCG/ACT inhaler Inhale 2 puffs into the lungs every 6 (six) hours as needed for wheezing or shortness of breath. 8 g 0   aspirin 81 MG tablet Take 1 tablet (81 mg total) by mouth daily. 90 tablet 3   cetirizine (ZYRTEC) 10 MG tablet Take 10 mg by mouth at bedtime.     cilostazol (PLETAL) 100 MG tablet TAKE 1 TABLET BY MOUTH TWICE A DAY 180 tablet 3   Evolocumab (REPATHA SURECLICK) 140 MG/ML SOAJ Inject 140 mg into the skin every 14 (fourteen) days. 2 mL 11   fenofibrate 160 MG tablet Take 1 tablet (160 mg total) by mouth at bedtime.     fluticasone (FLONASE) 50 MCG/ACT nasal spray Place 2 sprays into both nostrils at bedtime.     guaiFENesin-codeine 100-10 MG/5ML syrup Take 5 mLs by mouth 3 (three) times daily as needed for cough. 120 mL 0   ipratropium (ATROVENT) 0.03 % nasal spray Place 2 sprays into both nostrils 2 (two) times daily.     losartan (COZAAR) 50 MG tablet Take 50 mg by mouth daily.     metoprolol succinate (TOPROL-XL) 100 MG 24 hr tablet TAKE 1 TABLET BY MOUTH AT BEDTIME. TAKE WITH OR IMMEDIATELY FOLLOWING A MEAL. 90 tablet 3   nitroGLYCERIN (NITROSTAT)  0.4 MG SL tablet Place 1 tablet (0.4 mg total) under the tongue every 5 (five) minutes as needed for chest pain. 25 tablet 3   PROTEIN PO Take 1 Can by mouth daily. Protein shake     rosuvastatin (CRESTOR) 40 MG tablet TAKE 1 TABLET BY MOUTH EVERY DAY 90 tablet 3   triamterene-hydrochlorothiazide (MAXZIDE-25) 37.5-25 MG tablet TAKE 1 TABLET BY MOUTH EVERY DAY 90 tablet 3   No current facility-administered medications for this visit.    Allergies:   Cucumber extract, Amoxicillin, and Penicillins    Social History:  The patient  reports that he quit smoking about 5 months ago. His smoking use included cigarettes. He has a 49.00 pack-year smoking history. He has never used smokeless tobacco. He reports current alcohol  use. He reports current drug use. Drug: Marijuana.   Family History:  The patient's family history includes Congestive Heart Failure (age of onset: 73) in his mother; Coronary artery disease in an other family member; Factor V Leiden deficiency in his brother and sister; Heart attack (age of onset: 100) in his father; Heart attack (age of onset: 5) in an other family member; Lung cancer in his mother.    ROS:  Please see the history of present illness.   Otherwise, review of systems are positive for none.   All other systems are reviewed and negative.    PHYSICAL EXAM: VS:  BP 138/60 (BP Location: Left Arm, Patient Position: Sitting, Cuff Size: Normal)   Pulse 81   Ht 5' 10.5" (1.791 m)   Wt 258 lb 4 oz (117.1 kg)   SpO2 95%   BMI 36.53 kg/m  , BMI Body mass index is 36.53 kg/m. GEN: Well nourished, well developed, in no acute distress  HEENT: normal  Neck: no JVD, carotid bruits, or masses Cardiac: RRR; no murmurs, rubs, or gallops, mild bilateral leg edema Respiratory:  clear to auscultation bilaterally, normal work of breathing GI: soft, nontender, nondistended, + BS MS: no deformity or atrophy  Skin: warm and dry, no rash Neuro:  Strength and sensation are intact Psych: euthymic mood, full affect Vascular: Radial pulses normal bilaterally.   Distal pulses are not palpable.  EKG:  EKG is  ordered today. EKG showed normal sinus rhythm with no significant ST or T wave changes.   Recent Labs: 02/01/2022: Hemoglobin 12.6; Platelets 211 08/05/2022: BUN 62; Creatinine 3.8; Potassium 4.2; Sodium 142    Lipid Panel    Component Value Date/Time   CHOL 87 (L) 04/14/2021 0930   TRIG 108 04/14/2021 0930   HDL 40 04/14/2021 0930   CHOLHDL 2.2 04/14/2021 0930   CHOLHDL 6.8 (H) 05/17/2016 1037   VLDL 49 (H) 05/17/2016 1037   LDLCALC 27 04/14/2021 0930   LDLDIRECT 95.7 01/05/2013 0946      Wt Readings from Last 3 Encounters:  10/08/22 258 lb 4 oz (117.1 kg)  06/10/22 256 lb 2  oz (116.2 kg)  03/11/22 244 lb 6 oz (110.8 kg)           No data to display            ASSESSMENT AND PLAN:  1.  Peripheral arterial disease with moderate bilateral calf claudication right worse than left.  This is due to an occluded right popliteal artery with below the knee disease as well.  He also has left SFA disease.  His symptoms are stable on cilostazol 100 mg twice daily.  Continue aggressive medical therapy.    2.  Tobacco use: I congratulated him on smoking cessation.  3.  Mixed hyperlipidemia: He is doing very well on Repatha.  Most recent lipid profile showed an LDL of 27.  4.  Essential hypertension: Blood pressure is reasonably controlled.  5.  Coronary artery disease involving native coronary arteries without angina: Stable overall.  Continue medical therapy.  6.  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  Followed by nephrology.   Disposition:   FU with me in 12 months  Signed,  Lorine Bears, MD  10/08/2022 12:00 PM     [facility]

## 2022-10-08 NOTE — Patient Instructions (Signed)
Medication Instructions:  No changes *If you need a refill on your cardiac medications before your next appointment, please call your pharmacy*   Lab Work: None ordered If you have labs (blood work) drawn today and your tests are completely normal, you will receive your results only by: MyChart Message (if you have MyChart) OR A paper copy in the mail If you have any lab test that is abnormal or we need to change your treatment, we will call you to review the results.   Testing/Procedures: None ordered   Follow-Up: At [facility], you and your health needs are our priority.  As part of our continuing mission to provide you with exceptional heart care, we have created designated Provider Care Teams.  These Care Teams include your primary Cardiologist (physician) and Advanced Practice Providers (APPs -  Physician Assistants and Nurse Practitioners) who all work together to provide you with the care you need, when you need it.  We recommend signing up for the patient portal called "MyChart".  Sign up information is provided on this After Visit Summary.  MyChart is used to connect with patients for Virtual Visits (Telemedicine).  Patients are able to view lab/test results, encounter notes, upcoming appointments, etc.  Non-urgent messages can be sent to your provider as well.   To learn more about what you can do with MyChart, go to [email or web address].    Your next appointment:   12 month(s)  Provider:   You may see Dr. Arida or one of the following Advanced Practice Providers on your designated Care Team:   Christopher Berge, NP Ryan Dunn, PA-C Cadence Furth, PA-C Sheri Hammock, NP    

## 2022-10-19 ENCOUNTER — Ambulatory Visit
Admission: RE | Admit: 2022-10-19 | Discharge: 2022-10-19 | Disposition: A | Discharge status: Home / Self Care | Payer: BC Managed Care – PPO | Source: Ambulatory Visit | Attending: Acute Care | Admitting: Acute Care

## 2022-10-19 DIAGNOSIS — R911 Solitary pulmonary nodule: Secondary | ICD-10-CM | POA: Diagnosis not present

## 2022-10-19 DIAGNOSIS — Z122 Encounter for screening for malignant neoplasm of respiratory organs: Secondary | ICD-10-CM | POA: Diagnosis not present

## 2022-10-19 DIAGNOSIS — I700 Atherosclerosis of aorta: Secondary | ICD-10-CM | POA: Diagnosis not present

## 2022-10-19 DIAGNOSIS — J439 Emphysema, unspecified: Secondary | ICD-10-CM | POA: Diagnosis not present

## 2022-10-19 DIAGNOSIS — Z87891 Personal history of nicotine dependence: Secondary | ICD-10-CM

## 2022-10-22 ENCOUNTER — Telehealth: Payer: Self-pay | Admitting: Acute Care

## 2022-10-22 NOTE — Telephone Encounter (Signed)
Received a call report from [facility]iology for patient's lung cancer screening CT. Below is a copy of the impression:   " IMPRESSION: 1. Lung-RADS 3, probably benign findings. Short-term follow-up in 6 months is recommended with repeat low-dose chest CT without contrast (please use the following order, "CT CHEST LCS NODULE FOLLOW-UP W/O CM"). 2. Coronary artery calcifications. 3. Aortic Atherosclerosis (ICD10-I70.0) and Emphysema (ICD10-J43.9)."  David Cowan, can you please advise?

## 2022-10-22 NOTE — Telephone Encounter (Signed)
Call Report CT scan

## 2022-10-25 ENCOUNTER — Other Ambulatory Visit: Payer: Self-pay

## 2022-10-25 ENCOUNTER — Telehealth: Payer: Self-pay | Admitting: Acute Care

## 2022-10-25 DIAGNOSIS — Z87891 Personal history of nicotine dependence: Secondary | ICD-10-CM

## 2022-10-25 DIAGNOSIS — R911 Solitary pulmonary nodule: Secondary | ICD-10-CM

## 2022-10-25 NOTE — Telephone Encounter (Signed)
Spoke with patient by phone, using two patient identifiers, to review results of recent LDCT.  Emphysema and atherosclerosis, as previously noted.  Previous lung nodule of interest has resolved.  New nodule noted in right lung with recommendation for additional 6 months follow up LDCT.  Patient had recent cough and congestion, about a month ago.  Feels it has cleared now.  Advised nodule likely benign and as a precaution, would like to repeat LDCT in 6 months to follow nodule stability.  Patient in agreement and had no questions.  Order placed for 6 months nodule follow up LDCT and results/plan faxed to PCP

## 2022-11-10 ENCOUNTER — Ambulatory Visit (INDEPENDENT_AMBULATORY_CARE_PROVIDER_SITE_OTHER): Payer: BC Managed Care – PPO | Admitting: Nurse Practitioner

## 2022-11-10 ENCOUNTER — Encounter: Payer: Self-pay | Admitting: Nurse Practitioner

## 2022-11-10 VITALS — BP 134/76 | HR 79 | Temp 98.2°F | Resp 16 | Ht 70.0 in | Wt 255.5 lb

## 2022-11-10 DIAGNOSIS — Z0000 Encounter for general adult medical examination without abnormal findings: Secondary | ICD-10-CM | POA: Diagnosis not present

## 2022-11-10 DIAGNOSIS — I10 Essential (primary) hypertension: Secondary | ICD-10-CM

## 2022-11-10 DIAGNOSIS — E785 Hyperlipidemia, unspecified: Secondary | ICD-10-CM

## 2022-11-10 DIAGNOSIS — Z87891 Personal history of nicotine dependence: Secondary | ICD-10-CM | POA: Diagnosis not present

## 2022-11-10 DIAGNOSIS — I739 Peripheral vascular disease, unspecified: Secondary | ICD-10-CM | POA: Diagnosis not present

## 2022-11-10 DIAGNOSIS — Z125 Encounter for screening for malignant neoplasm of prostate: Secondary | ICD-10-CM

## 2022-11-10 DIAGNOSIS — E669 Obesity, unspecified: Secondary | ICD-10-CM | POA: Diagnosis not present

## 2022-11-10 DIAGNOSIS — R5383 Other fatigue: Secondary | ICD-10-CM | POA: Diagnosis not present

## 2022-11-10 DIAGNOSIS — J449 Chronic obstructive pulmonary disease, unspecified: Secondary | ICD-10-CM

## 2022-11-10 LAB — COMPREHENSIVE METABOLIC PANEL
ALT: 10 U/L (ref 0–53)
AST: 16 U/L (ref 0–37)
Albumin: 3.7 g/dL (ref 3.5–5.2)
Alkaline Phosphatase: 32 U/L — ABNORMAL LOW (ref 39–117)
BUN: 54 mg/dL — ABNORMAL HIGH (ref 6–23)
CO2: 21 mEq/L (ref 19–32)
Calcium: 8.5 mg/dL (ref 8.4–10.5)
Chloride: 110 mEq/L (ref 96–112)
Creatinine, Ser: 3.68 mg/dL — ABNORMAL HIGH (ref 0.40–1.50)
GFR: 16.94 mL/min — ABNORMAL LOW (ref >60.00)
Glucose, Bld: 81 mg/dL (ref 70–99)
Potassium: 4.6 mEq/L (ref 3.5–5.1)
Sodium: 139 mEq/L (ref 135–145)
Total Bilirubin: 0.3 mg/dL (ref 0.2–1.2)
Total Protein: 6.9 g/dL (ref 6.0–8.3)

## 2022-11-10 LAB — URINALYSIS, MICROSCOPIC ONLY: RBC / HPF: NONE SEEN (ref 0–?)

## 2022-11-10 LAB — LIPID PANEL
Cholesterol: 87 mg/dL (ref 0–200)
HDL: 33.9 mg/dL — ABNORMAL LOW (ref >39.00)
LDL Cholesterol: 26 mg/dL (ref 0–99)
NonHDL: 52.86
Total CHOL/HDL Ratio: 3
Triglycerides: 135 mg/dL (ref 0.0–149.0)
VLDL: 27 mg/dL (ref 0.0–40.0)

## 2022-11-10 LAB — CBC
HCT: 37 % — ABNORMAL LOW (ref 39.0–52.0)
Hemoglobin: 11.7 g/dL — ABNORMAL LOW (ref 13.0–17.0)
MCHC: 31.5 g/dL (ref 30.0–36.0)
MCV: 92.9 fl (ref 78.0–100.0)
Platelets: 228 10*3/uL (ref 150.0–400.0)
RBC: 3.98 Mil/uL — ABNORMAL LOW (ref 4.22–5.81)
RDW: 14.9 % (ref 11.5–15.5)
WBC: 11.4 10*3/uL — ABNORMAL HIGH (ref 4.0–10.5)

## 2022-11-10 LAB — TSH: TSH: 5.8 u[IU]/mL — ABNORMAL HIGH (ref 0.35–5.50)

## 2022-11-10 LAB — VITAMIN D 25 HYDROXY (VIT D DEFICIENCY, FRACTURES): VITD: 8.44 ng/mL — ABNORMAL LOW (ref 30.00–100.00)

## 2022-11-10 LAB — VITAMIN B12: Vitamin B-12: 350 pg/mL (ref 211–911)

## 2022-11-10 LAB — PSA: PSA: 2.76 ng/mL (ref 0.10–4.00)

## 2022-11-10 LAB — HEMOGLOBIN A1C: Hgb A1c MFr Bld: 5.7 % (ref 4.6–6.5)

## 2022-11-10 NOTE — Patient Instructions (Signed)
Nice to see you today I will be in touch with the labs once I have them Follow up with me in 1 year, sooner if you need me  Consider getting the shingles vaccine

## 2022-11-10 NOTE — Assessment & Plan Note (Signed)
Patient has stopped smoking.  Will check urine microscopy to rule out microscopic hematuria

## 2022-11-10 NOTE — Assessment & Plan Note (Signed)
Patient currently maintained on losartan, metoprolol, Maxide.  He is followed by cardiology.  Continue taking medication as prescribed follow-up with specialist as recommended

## 2022-11-10 NOTE — Assessment & Plan Note (Signed)
Discussed age-appropriate immunizations and screening exams.  Did review patient's personal, surgical, social, family histories.  Patient is up-to-date on all age-appropriate vaccinations that he wants.  Did discuss shingles information given at discharge to patient.  Patient up-to-date on CRC screening, lung cancer screening.  Did do PSA today for prostate cancer screening.  Patient was given information at discharge about preventative healthcare maintenance with anticipatory guidance.

## 2022-11-10 NOTE — Assessment & Plan Note (Signed)
Ambiguous in nature.  Pending labs inclusive of TSH, CBC, CMP, B12, vitamin D.

## 2022-11-10 NOTE — Assessment & Plan Note (Signed)
Noted on CT scan of chest.  Patient is not needed albuterol inhaler per his report.  He does have 1 at home in case he needs it

## 2022-11-10 NOTE — Assessment & Plan Note (Signed)
Pletal followed by cardiology/vascular.  Continue follow-up with specialist as recommended

## 2022-11-10 NOTE — Assessment & Plan Note (Signed)
Pending labs inclusive of TSH, lipid, A1c

## 2022-11-10 NOTE — Progress Notes (Signed)
Established Patient Office Visit  Subjective   Patient ID: David Cowan, male    DOB: 1960/10/23  Age: 62 y.o. MRN: [id number]  Chief Complaint  Patient presents with   Annual Exam    HPI  HTN: losartan, metoprolol, maxzide. Followed by Cardiology  HLD: crestor, tricor, and repatha. Follwed by cardiology  Tobacco: former smoker. Has been quit almost 8 months per patient report  COPD: currently maintainted on albuterol as needed. States that he has not needed the inhaler at all  for complete physical and follow up of chronic conditions.  Immunizations: -Tetanus: Completed in 2019 -Influenza: out of season  -Shingles: discussed in office -Pneumonia:  -covid: pfizer  Diet: Fair diet. States he feels like he is eating more since he stopped smoking. Through the week he eats out. States that he will do water, soda with a meal (sprite or sweet tea) Exercise: No regular exercise. Employment   Eye exam: Completes annually. Wears glasses  Dental exam: Completes semi-annually    Colonoscopy: Completed in 2015, repeat in 10 years  Lung Cancer Screening: Completed in 10/19/2022 with requested 6 month CT follow up   PSA: Due  Sleep: goes to bed around 11 and gets up around 2-3 for a bathroomm break. 6-7 for bathroom. He will ge up at 725. Does not snore but sleeps ina recliner since his hip replacement       Review of Systems  Constitutional:  Negative for chills and fever.  Respiratory:  Negative for shortness of breath.   Cardiovascular:  Negative for chest pain and leg swelling.  Gastrointestinal:  Negative for abdominal pain, blood in stool, constipation, diarrhea, nausea and vomiting.       BM daily  Genitourinary:  Negative for dysuria and hematuria.  Neurological:  Negative for tingling and headaches.  Psychiatric/Behavioral:  Negative for hallucinations and suicidal ideas.       Objective:     BP 134/76   Pulse 79   Temp 98.2 F (36.8 C)   Resp 16   Ht 5'  10" (1.778 m)   Wt 255 lb 8 oz (115.9 kg)   SpO2 94%   BMI 36.66 kg/m  BP Readings from Last 3 Encounters:  11/10/22 134/76  10/08/22 138/60  06/10/22 130/78   Wt Readings from Last 3 Encounters:  11/10/22 255 lb 8 oz (115.9 kg)  10/08/22 258 lb 4 oz (117.1 kg)  06/10/22 256 lb 2 oz (116.2 kg)      Physical Exam Vitals and nursing note reviewed.  Constitutional:      Appearance: Normal appearance.  HENT:     Right Ear: Tympanic membrane, ear canal and external ear normal.     Left Ear: Tympanic membrane, ear canal and external ear normal.     Mouth/Throat:     Mouth: Mucous membranes are moist.     Pharynx: Oropharynx is clear.  Eyes:     Extraocular Movements: Extraocular movements intact.     Pupils: Pupils are equal, round, and reactive to light.  Cardiovascular:     Rate and Rhythm: Normal rate and regular rhythm.     Pulses: Normal pulses.     Heart sounds: Normal heart sounds.  Pulmonary:     Effort: Pulmonary effort is normal.     Breath sounds: Normal breath sounds.  Abdominal:     General: Bowel sounds are normal. There is no distension.     Palpations: There is no mass.     Tenderness:  There is no abdominal tenderness.     Hernia: No hernia is present.  Genitourinary:    Comments: Deferred  Musculoskeletal:     Right lower leg: No edema.     Left lower leg: No edema.  Lymphadenopathy:     Cervical: No cervical adenopathy.  Skin:    General: Skin is warm.  Neurological:     General: No focal deficit present.     Mental Status: He is alert.     Deep Tendon Reflexes:     Reflex Scores:      Bicep reflexes are 2+ on the right side and 2+ on the left side.      Patellar reflexes are 2+ on the right side and 2+ on the left side.    Comments: Bilateral upper and lower extremity strength 5/5  Psychiatric:        Mood and Affect: Mood normal.        Behavior: Behavior normal.        Thought Content: Thought content normal.        Judgment: Judgment  normal.      No results found for any visits on 11/10/22.    The ASCVD Risk score (Arnett DK, et al., 2019) failed to calculate for the following reasons:   The patient has a prior MI or stroke diagnosis    Assessment & Plan:   Problem List Items Addressed This Visit       Cardiovascular and Mediastinum   Essential hypertension - Primary    Patient currently maintained on losartan, metoprolol, Maxide.  He is followed by cardiology.  Continue taking medication as prescribed follow-up with specialist as recommended      Relevant Orders   CBC   Comprehensive metabolic panel   PVD (peripheral vascular disease) (HCC)    Pletal followed by cardiology/vascular.  Continue follow-up with specialist as recommended        Respiratory   Chronic obstructive pulmonary disease (HCC)    Noted on CT scan of chest.  Patient is not needed albuterol inhaler per his report.  He does have 1 at home in case he needs it        Other   Hyperlipidemia   Relevant Orders   Hemoglobin A1c   Lipid panel   Preventative health care    Discussed age-appropriate immunizations and screening exams.  Did review patient's personal, surgical, social, family histories.  Patient is up-to-date on all age-appropriate vaccinations that he wants.  Did discuss shingles information given at discharge to patient.  Patient up-to-date on CRC screening, lung cancer screening.  Did do PSA today for prostate cancer screening.  Patient was given information at discharge about preventative healthcare maintenance with anticipatory guidance.      Relevant Orders   CBC   Comprehensive metabolic panel   Obesity (BMI 16-10.9)    Pending labs inclusive of TSH, lipid, A1c      Relevant Orders   Hemoglobin A1c   Lipid panel   Fatigue    Ambiguous in nature.  Pending labs inclusive of TSH, CBC, CMP, B12, vitamin D.      Relevant Orders   Vitamin B12   TSH   VITAMIN D 25 Hydroxy (Vit-D Deficiency, Fractures)   Former  tobacco use    Patient has stopped smoking.  Will check urine microscopy to rule out microscopic hematuria      Relevant Orders   Urine Microscopic   Other Visit Diagnoses  Screening for prostate cancer       Relevant Orders   PSA       Return in about 1 year (around 11/10/2023) for CPE and Labs.    Audria Nine, NP

## 2022-11-12 ENCOUNTER — Other Ambulatory Visit (INDEPENDENT_AMBULATORY_CARE_PROVIDER_SITE_OTHER): Payer: BC Managed Care – PPO

## 2022-11-12 ENCOUNTER — Other Ambulatory Visit: Payer: Self-pay | Admitting: Nurse Practitioner

## 2022-11-12 DIAGNOSIS — R7989 Other specified abnormal findings of blood chemistry: Secondary | ICD-10-CM

## 2022-11-12 DIAGNOSIS — E559 Vitamin D deficiency, unspecified: Secondary | ICD-10-CM

## 2022-11-12 LAB — IBC PANEL
Iron: 86 ug/dL (ref 42–165)
Saturation Ratios: 20.4 % (ref 20.0–50.0)
TIBC: 421.4 ug/dL (ref 250.0–450.0)
Transferrin: 301 mg/dL (ref 212.0–360.0)

## 2022-11-12 MED ORDER — VITAMIN D (ERGOCALCIFEROL) 1.25 MG (50000 UNIT) PO CAPS: 50000.0000 [IU] | ORAL_CAPSULE | ORAL | 0 refills | Status: AC

## 2022-11-12 NOTE — Addendum Note (Signed)
Addended by: Clearnce Sorrel on: 11/12/2022 02:32 PM   Modules accepted: Orders

## 2022-11-12 NOTE — Addendum Note (Signed)
Addended by: Jethro Bastos, Elvena Oyer I on: 11/12/2022 03:30 PM   Modules accepted: Orders

## 2022-12-08 DIAGNOSIS — N1832 Chronic kidney disease, stage 3b: Secondary | ICD-10-CM | POA: Diagnosis not present

## 2022-12-08 DIAGNOSIS — E785 Hyperlipidemia, unspecified: Secondary | ICD-10-CM | POA: Diagnosis not present

## 2022-12-08 DIAGNOSIS — I10 Essential (primary) hypertension: Secondary | ICD-10-CM | POA: Diagnosis not present

## 2022-12-08 DIAGNOSIS — N051 Unspecified nephritic syndrome with focal and segmental glomerular lesions: Secondary | ICD-10-CM | POA: Diagnosis not present

## 2022-12-09 LAB — LAB REPORT - SCANNED
Albumin, Urine POC: 3886.7
Albumin/Creatinine Ratio, Urine, POC: 5128
Creatinine, POC: 75.8 mg/dL

## 2023-01-15 ENCOUNTER — Other Ambulatory Visit: Payer: Self-pay | Admitting: Internal Medicine

## 2023-01-15 ENCOUNTER — Other Ambulatory Visit: Payer: Self-pay | Admitting: Cardiovascular Disease

## 2023-01-15 DIAGNOSIS — I10 Essential (primary) hypertension: Secondary | ICD-10-CM

## 2023-01-15 DIAGNOSIS — E785 Hyperlipidemia, unspecified: Secondary | ICD-10-CM

## 2023-01-15 DIAGNOSIS — I2510 Atherosclerotic heart disease of native coronary artery without angina pectoris: Secondary | ICD-10-CM

## 2023-02-02 DIAGNOSIS — L918 Other hypertrophic disorders of the skin: Secondary | ICD-10-CM | POA: Diagnosis not present

## 2023-02-02 DIAGNOSIS — D485 Neoplasm of uncertain behavior of skin: Secondary | ICD-10-CM | POA: Diagnosis not present

## 2023-02-02 DIAGNOSIS — C44311 Basal cell carcinoma of skin of nose: Secondary | ICD-10-CM | POA: Diagnosis not present

## 2023-02-02 DIAGNOSIS — B078 Other viral warts: Secondary | ICD-10-CM | POA: Diagnosis not present

## 2023-02-05 ENCOUNTER — Other Ambulatory Visit: Payer: Self-pay | Admitting: Cardiovascular Disease

## 2023-02-25 ENCOUNTER — Other Ambulatory Visit: Payer: Self-pay | Admitting: Cardiovascular Disease

## 2023-02-25 DIAGNOSIS — I2510 Atherosclerotic heart disease of native coronary artery without angina pectoris: Secondary | ICD-10-CM

## 2023-02-25 DIAGNOSIS — E785 Hyperlipidemia, unspecified: Secondary | ICD-10-CM

## 2023-02-25 DIAGNOSIS — I10 Essential (primary) hypertension: Secondary | ICD-10-CM

## 2023-03-12 ENCOUNTER — Other Ambulatory Visit: Payer: Self-pay | Admitting: Cardiovascular Disease

## 2023-03-12 ENCOUNTER — Encounter: Payer: Self-pay | Admitting: Nurse Practitioner

## 2023-03-12 DIAGNOSIS — Z79899 Other long term (current) drug therapy: Secondary | ICD-10-CM

## 2023-03-14 NOTE — Telephone Encounter (Signed)
This gentleman wants to make sure there are not drug to drug interactions between this supplement and his medications

## 2023-03-16 ENCOUNTER — Other Ambulatory Visit: Payer: BC Managed Care – PPO | Admitting: Pharmacist

## 2023-03-16 NOTE — Progress Notes (Deleted)
Called and spoke to patient regarding medication Question: ProstaVive   ProstaVive supplement is marketed by several different websites/distributors, each containing very different ingredients. Called patient to clarify contents of the supplement that he has been looking into trying.   We discussed the supplement marketed on [email or web address]   Fenugreek Cynara sclymus Ashwigandha Cordycepts: inhibits platelet aggregation and my increase the effect of anticoagulant medications (aspirin) Maca root Beet rot Stinging nettle Tongkat Ali Panax ginseng Boron citrate

## 2023-03-17 ENCOUNTER — Other Ambulatory Visit: Payer: BC Managed Care – PPO

## 2023-03-21 ENCOUNTER — Encounter: Payer: Self-pay | Admitting: Pharmacist

## 2023-03-21 NOTE — Patient Instructions (Addendum)
David Cowan,   It was a pleasure to see you today! As we discussed:   Overall, the supplements/herbals within ProstaVive are likely safe the small quantities/when taken as directed on the bottle. The largest theoretical concern is increased bleed risk while taking aspirin given possible antiplatelet effects of several ingredients (ginseng, stinging nettle, codycepts). It is certainly reasonable to try the supplement if desired with monitoring for any bleeding/bruising. Please note that data surrounding natural herbals is extremely limited, so most of the side effects/concerns are theoretical based on individual case reports.  Additionally, it is always good to be aware that we cannot guarantee the safety, purity or contents of dietary supplements/herbal products given that they are not regulated by the FDA. Typically, products with "USP" printed on the label are preferred as this means the individual ingredients are regulated by FDA for purity/safety though does not correlate to proven efficacy.   Please let me know if any other questions/concerns come up.   Berenice Primas, PharmD - Clinical Pharmacist  ______________________________________________________________________________________________   Herbal/ingredient: Possible concern with current medications or chronic conditions Fenugreek: N/A Cynara sclymus: N/A Ashwigandha: N/A - Case reports of liver toxicity  Cordycepts: May inhibit platelet aggregation and my increase the effect of certain medications such as aspirin. Maca root: N/A Beet root: N/A Stinging nettle: Potential to cause low blood pressure. Potential to increase blood sugar. Possible effect on antiplatelet medications such as aspirin could contribute to increased bleed risk.  Arbutus Ped: N/A Panax ginseng: May increase effect of antiplatelet medications. May increase risk of bleeding in those on aspirin.  Boron citrate: N/A (typically safe in lower doses <20  mg/day)

## 2023-03-21 NOTE — Progress Notes (Signed)
Called and spoke to patient regarding medication Question: ProstaVive   ProstaVive supplement is marketed by several different websites/distributors, each containing very different ingredients. Called patient to clarify contents of the supplement that he has been looking into trying.   We discussed the supplement marketed on [email or web address]   Herbal/ingredient: Possible concern with current medications or chronic conditions Fenugreek: N/A Cynara sclymus: N/A Ashwigandha: N/A - Case reports of liver toxicity  Cordycepts: inhibits platelet aggregation and my increase the effect of anticoagulant medications (aspirin) Maca root: N/A Beet root: N/A Stinging nettle: Potential to cause low blood pressure. Potential to increase blood sugar. Possible effect on antiplatelet medications such as aspirin could contribute to increased bleed risk.  Arbutus Ped: N/A Panax ginseng: May increase effect of antiplatelet medications. May increase risk of bleeding in those on aspirin.  Boron citrate: N/A (typically safe in lower doses <20 mg/day)   Overall, we reviewed that in small quantities, most supplements/herbals within ProstaVive are likely safe. The largest theoretical concern is increased bleed risk while taking aspirin given possible antiplatelet effects of several ingredients. It is certainly reasonable to try the supplement if desired with monitoring for s/sx bleeding/bruising.   Additionally, the patient is aware that we cannot guarantee the safety, purity or contents of dietary supplements/herbal products given that they are not regulated by the FDA. Typically, products with "USP" printed on the label are preferred as this means the individual ingredients are regulated by FDA for purity/safety though does not correlate to proven efficacy.    Loree Fee, PharmD Clinical Pharmacist [facility] [phone]

## 2023-04-13 DIAGNOSIS — E785 Hyperlipidemia, unspecified: Secondary | ICD-10-CM | POA: Diagnosis not present

## 2023-04-13 DIAGNOSIS — N1832 Chronic kidney disease, stage 3b: Secondary | ICD-10-CM | POA: Diagnosis not present

## 2023-04-13 DIAGNOSIS — I10 Essential (primary) hypertension: Secondary | ICD-10-CM | POA: Diagnosis not present

## 2023-04-13 DIAGNOSIS — N051 Unspecified nephritic syndrome with focal and segmental glomerular lesions: Secondary | ICD-10-CM | POA: Diagnosis not present

## 2023-04-14 LAB — LAB REPORT - SCANNED
Albumin, Urine POC: 2920.1
Creatinine, POC: 79.4 mg/dL
EGFR: 13
Microalb Creat Ratio: 3678

## 2023-04-18 DIAGNOSIS — L578 Other skin changes due to chronic exposure to nonionizing radiation: Secondary | ICD-10-CM | POA: Diagnosis not present

## 2023-04-18 DIAGNOSIS — C44311 Basal cell carcinoma of skin of nose: Secondary | ICD-10-CM | POA: Diagnosis not present

## 2023-04-18 DIAGNOSIS — L814 Other melanin hyperpigmentation: Secondary | ICD-10-CM | POA: Diagnosis not present

## 2023-04-21 ENCOUNTER — Ambulatory Visit
Admission: RE | Admit: 2023-04-21 | Discharge: 2023-04-21 | Disposition: A | Discharge status: Home / Self Care | Payer: BC Managed Care – PPO | Source: Ambulatory Visit | Attending: Nurse Practitioner | Admitting: Nurse Practitioner

## 2023-04-21 DIAGNOSIS — J439 Emphysema, unspecified: Secondary | ICD-10-CM | POA: Diagnosis not present

## 2023-04-21 DIAGNOSIS — R911 Solitary pulmonary nodule: Secondary | ICD-10-CM

## 2023-04-21 DIAGNOSIS — R918 Other nonspecific abnormal finding of lung field: Secondary | ICD-10-CM | POA: Diagnosis not present

## 2023-04-21 DIAGNOSIS — I700 Atherosclerosis of aorta: Secondary | ICD-10-CM | POA: Diagnosis not present

## 2023-04-21 DIAGNOSIS — Z87891 Personal history of nicotine dependence: Secondary | ICD-10-CM

## 2023-04-25 ENCOUNTER — Encounter: Payer: Self-pay | Admitting: Nephrology

## 2023-05-19 ENCOUNTER — Other Ambulatory Visit: Payer: Self-pay

## 2023-05-19 DIAGNOSIS — N051 Unspecified nephritic syndrome with focal and segmental glomerular lesions: Secondary | ICD-10-CM | POA: Diagnosis not present

## 2023-05-19 DIAGNOSIS — I10 Essential (primary) hypertension: Secondary | ICD-10-CM | POA: Diagnosis not present

## 2023-05-19 DIAGNOSIS — E785 Hyperlipidemia, unspecified: Secondary | ICD-10-CM | POA: Diagnosis not present

## 2023-05-19 DIAGNOSIS — Z122 Encounter for screening for malignant neoplasm of respiratory organs: Secondary | ICD-10-CM

## 2023-05-19 DIAGNOSIS — N185 Chronic kidney disease, stage 5: Secondary | ICD-10-CM | POA: Diagnosis not present

## 2023-05-19 DIAGNOSIS — Z87891 Personal history of nicotine dependence: Secondary | ICD-10-CM

## 2023-05-19 LAB — LAB REPORT - SCANNED: EGFR: 16

## 2023-06-16 DIAGNOSIS — D485 Neoplasm of uncertain behavior of skin: Secondary | ICD-10-CM | POA: Diagnosis not present

## 2023-06-16 DIAGNOSIS — L905 Scar conditions and fibrosis of skin: Secondary | ICD-10-CM | POA: Diagnosis not present

## 2023-06-16 DIAGNOSIS — D225 Melanocytic nevi of trunk: Secondary | ICD-10-CM | POA: Diagnosis not present

## 2023-06-16 DIAGNOSIS — D2262 Melanocytic nevi of left upper limb, including shoulder: Secondary | ICD-10-CM | POA: Diagnosis not present

## 2023-06-16 DIAGNOSIS — D2272 Melanocytic nevi of left lower limb, including hip: Secondary | ICD-10-CM | POA: Diagnosis not present

## 2023-06-16 DIAGNOSIS — D2239 Melanocytic nevi of other parts of face: Secondary | ICD-10-CM | POA: Diagnosis not present

## 2023-06-16 DIAGNOSIS — L720 Epidermal cyst: Secondary | ICD-10-CM | POA: Diagnosis not present

## 2023-06-16 DIAGNOSIS — L570 Actinic keratosis: Secondary | ICD-10-CM | POA: Diagnosis not present

## 2023-06-16 DIAGNOSIS — D2261 Melanocytic nevi of right upper limb, including shoulder: Secondary | ICD-10-CM | POA: Diagnosis not present

## 2023-06-16 DIAGNOSIS — L820 Inflamed seborrheic keratosis: Secondary | ICD-10-CM | POA: Diagnosis not present

## 2023-07-01 DIAGNOSIS — E785 Hyperlipidemia, unspecified: Secondary | ICD-10-CM | POA: Diagnosis not present

## 2023-07-01 DIAGNOSIS — N051 Unspecified nephritic syndrome with focal and segmental glomerular lesions: Secondary | ICD-10-CM | POA: Diagnosis not present

## 2023-07-01 DIAGNOSIS — I10 Essential (primary) hypertension: Secondary | ICD-10-CM | POA: Diagnosis not present

## 2023-07-01 DIAGNOSIS — N185 Chronic kidney disease, stage 5: Secondary | ICD-10-CM | POA: Diagnosis not present

## 2023-07-02 LAB — LAB REPORT - SCANNED
Albumin, Urine POC: 3448.9
Calcium: 8.1
Creatinine, POC: 94.1 mg/dL
EGFR: 15
Microalb Creat Ratio: 3665
PTH: 192

## 2023-07-19 ENCOUNTER — Other Ambulatory Visit: Payer: Self-pay | Admitting: Cardiovascular Disease

## 2023-07-26 ENCOUNTER — Ambulatory Visit: Payer: Self-pay | Admitting: Nurse Practitioner

## 2023-07-26 NOTE — Telephone Encounter (Signed)
Chief Complaint: diarrhea Symptoms: sore rectum Frequency: ongoing since Friday 2-3 times within 24 hours Pertinent Negatives: Patient denies fever Disposition: [] ED /[] Urgent Care (no appt availability in office) / [x] Appointment(In office/virtual)/ []  [facility]/ [] Home Care/ [] Refused Recommended Disposition /[] [place]/ []  Follow-up with PCP Additional Notes: The patient reported that since Friday he has had diarrhea.  In the past 24 hours he has gone 2-3 times.  It is a watery consistency.  He has had an intermittent burning sensation in his abdomen that he rated a 4-10.  He is unsure if the pain is relieved by passing stool.  His rectum has been sore and he has been using hemorrhoid wipes and A&D ointment to relieve the soreness.  He said it does not feel like hemorrhoids but he is unsure of what else it could be.  He has been drinking "plenty of water" since he started having these symptoms.  He was scheduled for a next day appointment with his pcp for further evaluation. Reason for Disposition  Abdominal pain  (Exception: Pain clears with each passage of diarrhea stool.)  Answer Assessment - Initial Assessment Questions 1. DIARRHEA SEVERITY: "How bad is the diarrhea?" "How many more stools have you had in the past 24 hours than normal?"    - NO DIARRHEA (SCALE 0)   - MILD (SCALE 1-3): Few loose or mushy BMs; increase of 1-3 stools over normal daily number of stools; mild increase in ostomy output.   -  MODERATE (SCALE 4-7): Increase of 4-6 stools daily over normal; moderate increase in ostomy output.   -  SEVERE (SCALE 8-10; OR "WORST POSSIBLE"): Increase of 7 or more stools daily over normal; moderate increase in ostomy output; incontinence.     Small amount of loose stool but has not been eating much 2-3 times  2. ONSET: "When did the diarrhea begin?"      Friday  Saturday ate soup Sunday two pieces of toast 1/2 ham and cheese sandwich 3. BM CONSISTENCY:  "How loose or watery is the diarrhea?"      Watery especially after soup on Saturday  4. VOMITING: "Are you also vomiting?" If Yes, ask: "How many times in the past 24 hours?"      None  5. ABDOMEN PAIN: "Are you having any abdomen pain?" If Yes, ask: "What does it feel like?" (e.g., crampy, dull, intermittent, constant)      Burning sensation 6. ABDOMEN PAIN SEVERITY: If present, ask: "How bad is the pain?"  (e.g., Scale 1-10; mild, moderate, or severe)   - MILD (1-3): doesn't interfere with normal activities, abdomen soft and not tender to touch    - MODERATE (4-7): interferes with normal activities or awakens from sleep, abdomen tender to touch    - SEVERE (8-10): excruciating pain, doubled over, unable to do any normal activities       5/10 7. ORAL INTAKE: If vomiting, "Have you been able to drink liquids?" "How much liquids have you had in the past 24 hours?"     Drinking plenty of water  8. HYDRATION: "Any signs of dehydration?" (e.g., dry mouth [not just dry lips], too weak to stand, dizziness, new weight loss) "When did you last urinate?"     No signs of dehydration  9. EXPOSURE: "Have you traveled to a foreign country recently?" "Have you been exposed to anyone with diarrhea?" "Could you have eaten any food that was spoiled?"     No  10. ANTIBIOTIC USE: "  Are you taking antibiotics now or have you taken antibiotics in the past 2 months?"       None  11. OTHER SYMPTOMS: "Do you have any other symptoms?" (e.g., fever, blood in stool)       Rectum is sore - wiping with hemorrhoid wipes and A&D ointment that helped some  Protocols used: Diarrhea-A-AH

## 2023-07-26 NOTE — Telephone Encounter (Signed)
noted

## 2023-07-27 ENCOUNTER — Ambulatory Visit: Payer: BC Managed Care – PPO | Admitting: Nurse Practitioner

## 2023-07-27 ENCOUNTER — Telehealth: Payer: Self-pay

## 2023-07-27 VITALS — BP 102/50 | HR 79 | Temp 98.0°F | Ht 70.0 in | Wt 248.2 lb

## 2023-07-27 DIAGNOSIS — A09 Infectious gastroenteritis and colitis, unspecified: Secondary | ICD-10-CM

## 2023-07-27 DIAGNOSIS — R1013 Epigastric pain: Secondary | ICD-10-CM | POA: Diagnosis not present

## 2023-07-27 DIAGNOSIS — N289 Disorder of kidney and ureter, unspecified: Secondary | ICD-10-CM

## 2023-07-27 LAB — BASIC METABOLIC PANEL
BUN: 118 mg/dL (ref 6–23)
CO2: 12 meq/L — ABNORMAL LOW (ref 19–32)
Calcium: 8.3 mg/dL — ABNORMAL LOW (ref 8.4–10.5)
Chloride: 118 meq/L — ABNORMAL HIGH (ref 96–112)
Creatinine, Ser: 6.89 mg/dL (ref 0.40–1.50)
GFR: 7.94 mL/min — CL (ref >60.00)
Glucose, Bld: 90 mg/dL (ref 70–99)
Potassium: 6 meq/L — ABNORMAL HIGH (ref 3.5–5.1)
Sodium: 140 meq/L (ref 135–145)

## 2023-07-27 LAB — CBC
HCT: 32.6 % — ABNORMAL LOW (ref 39.0–52.0)
Hemoglobin: 10 g/dL — ABNORMAL LOW (ref 13.0–17.0)
MCHC: 30.8 g/dL (ref 30.0–36.0)
MCV: 97.6 fL (ref 78.0–100.0)
Platelets: 232 10*3/uL (ref 150.0–400.0)
RBC: 3.34 Mil/uL — ABNORMAL LOW (ref 4.22–5.81)
RDW: 15.8 % — ABNORMAL HIGH (ref 11.5–15.5)
WBC: 10.1 10*3/uL (ref 4.0–10.5)

## 2023-07-27 MED ORDER — OMEPRAZOLE 20 MG PO CPDR: 20.0000 mg | DELAYED_RELEASE_CAPSULE | Freq: Every day | ORAL | 0 refills | Status: DC

## 2023-07-27 NOTE — Assessment & Plan Note (Signed)
Not physical pain on exam but given increased eructation "burning sensation and relief with Tums with omeprazole 20 mg daily for 1 month

## 2023-07-27 NOTE — Assessment & Plan Note (Signed)
Will check basic electrolytes and renal function since he does have chronic kidney disease.  Continue stay hydrated.  If diarrhea continues or is persistent consider stool studies at that juncture abdominal exam benign today

## 2023-07-27 NOTE — Telephone Encounter (Signed)
Noted. Can we let the patient know that is kidney function has worsened and to work on staying hydrated. I want his urine to be a clear or light yellow color.   I will forward the renal result to Dr. Signe Colt

## 2023-07-27 NOTE — Patient Instructions (Signed)
Nice to see you today I will be int ouch with the labs once I have them Follow up with me in approx 4 months for your physical and labs

## 2023-07-27 NOTE — Telephone Encounter (Signed)
CRITICAL VALUE STICKER  CRITICAL VALUE: BUN 118  Creatine 6.89 GFR 7.94     RECEIVER (on-site recipient of call): Gedalya Jim   DATE & TIME NOTIFIED: 4:05 PM 07/27/23   MESSENGER (representative from lab): Saa   MD NOTIFIED: Mordecai Maes, NP   TIME OF NOTIFICATION:4:06 PM   RESPONSE: Sending as high priority as well as documenting in RED book. Provider verbally informed as well. Will await further instructions.

## 2023-07-27 NOTE — Assessment & Plan Note (Signed)
Pending BMP.  Stay hydrated

## 2023-07-27 NOTE — Progress Notes (Signed)
Acute Office Visit  Subjective:     Patient ID: David Cowan, male    DOB: May 17, 1961, 63 y.o.   MRN: [id number]  Chief Complaint  Patient presents with   Diarrhea    Pt complains of ongoing diarrhea that started Friday. Frequency is 2-3 times a day. Complains of abdominal burning feeling. Pt complains of little appetite. states he was full after eating half a sandwich on Monday. No nausea or vomiting. No bowel movement last night.  Pt has been drinking a lot of water.     Patient is in today for diarrhea with abdominal burning with a history of HTN, MI, PVD, COPD, HLD  States that he has had the diarrhea for approx 1 week. On Friday he felt run down or tired . He is having 2 stools a day. States that he has started vitmain d that was over the past 2 weeks. He states that he did have stool today that was less runny.   Burning sensation that is in the epigastric area. Some increased eructation  States that it is worse at night. He sleeps in a recliner and does not lay down. States he will eat around 7 and goes to sleep around 11. He did take tums the past 2 night for the burning. He is unsure if it helped or not. He was able to get to sleep so he thinks that it helped    Currently being followed by [facility] for the process of kidney transplant Review of Systems  Constitutional:  Positive for malaise/fatigue. Negative for chills and fever.  HENT:  Negative for ear pain and sore throat.   Respiratory:  Negative for cough and shortness of breath.   Cardiovascular:  Negative for chest pain.  Gastrointestinal:  Positive for abdominal pain and diarrhea. Negative for nausea and vomiting.  Neurological:  Negative for headaches.        Objective:    BP (!) 102/50   Pulse 79   Temp 98 F (36.7 C) (Oral)   Ht 5\' 10"  (1.778 m)   Wt 248 lb 3.2 oz (112.6 kg)   SpO2 99%   BMI 35.61 kg/m    Physical Exam Vitals and nursing note reviewed. Exam conducted with a chaperone present  Melina Copa, CMA).  Constitutional:      Appearance: Normal appearance.  Cardiovascular:     Rate and Rhythm: Normal rate and regular rhythm.     Heart sounds: Normal heart sounds.  Pulmonary:     Effort: Pulmonary effort is normal.     Breath sounds: Normal breath sounds.  Abdominal:     General: Bowel sounds are normal. There is no distension.     Palpations: There is no mass.     Tenderness: There is no abdominal tenderness.     Hernia: No hernia is present.     Comments: Rectus diastasis   Genitourinary:    Rectum: Tenderness present. No mass, external hemorrhoid or internal hemorrhoid. Normal anal tone.       Comments: Excoriated skin Neurological:     Mental Status: He is alert.     No results found for any visits on 07/27/23.      Assessment & Plan:   Problem List Items Addressed This Visit       Digestive   Diarrhea of infectious origin   Will check basic electrolytes and renal function since he does have chronic kidney disease.  Continue stay hydrated.  If diarrhea continues or is  persistent consider stool studies at that juncture abdominal exam benign today      Relevant Orders   CBC   Basic metabolic panel     Other   Epigastric pain   Not physical pain on exam but given increased eructation "burning sensation and relief with Tums with omeprazole 20 mg daily for 1 month      Relevant Medications   omeprazole (PRILOSEC) 20 MG capsule   Decreased renal function - Primary   Pending BMP.  Stay hydrated      Relevant Orders   CBC   Basic metabolic panel    Meds ordered this encounter  Medications   omeprazole (PRILOSEC) 20 MG capsule    Sig: Take 1 capsule (20 mg total) by mouth daily.    Dispense:  30 capsule    Refill:  0    Supervising Provider:   Roxy Manns A [[id number]]    Return in about 4 months (around 11/24/2023) for CPE and Labs.  Audria Nine, NP

## 2023-07-28 ENCOUNTER — Encounter: Payer: Self-pay | Admitting: Nurse Practitioner

## 2023-07-28 ENCOUNTER — Other Ambulatory Visit: Payer: Self-pay | Admitting: Nurse Practitioner

## 2023-07-28 ENCOUNTER — Other Ambulatory Visit (INDEPENDENT_AMBULATORY_CARE_PROVIDER_SITE_OTHER): Payer: BC Managed Care – PPO

## 2023-07-28 ENCOUNTER — Telehealth: Payer: Self-pay

## 2023-07-28 DIAGNOSIS — N289 Disorder of kidney and ureter, unspecified: Secondary | ICD-10-CM

## 2023-07-28 DIAGNOSIS — E875 Hyperkalemia: Secondary | ICD-10-CM

## 2023-07-28 LAB — BASIC METABOLIC PANEL
BUN: 117 mg/dL (ref 6–23)
CO2: 13 meq/L — ABNORMAL LOW (ref 19–32)
Calcium: 8.1 mg/dL — ABNORMAL LOW (ref 8.4–10.5)
Chloride: 116 meq/L — ABNORMAL HIGH (ref 96–112)
Creatinine, Ser: 6.72 mg/dL (ref 0.40–1.50)
GFR: 8.19 mL/min — CL (ref >60.00)
Glucose, Bld: 105 mg/dL — ABNORMAL HIGH (ref 70–99)
Potassium: 5.5 meq/L — ABNORMAL HIGH (ref 3.5–5.1)
Sodium: 139 meq/L (ref 135–145)

## 2023-07-28 NOTE — Telephone Encounter (Signed)
Contacted pt and relayed information. Pt verbalized understanding and has no questions or concerns.

## 2023-07-28 NOTE — Telephone Encounter (Signed)
CRITICAL VALUE STICKER  CRITICAL VALUE: BUN 117 Creatinine 6.72 GRF 8.19   RECEIVER (on-site recipient of call): Donnamarie Poag   DATE & TIME NOTIFIED: 1:08 PM 07/28/23   MESSENGER (representative from lab): Henderson Cloud   MD NOTIFIED: Mordecai Maes, NP   TIME OF NOTIFICATION: 1:09 PM   RESPONSE:  documented in red book. Informed provider sending high priority message for his review and directions.

## 2023-07-29 ENCOUNTER — Telehealth: Payer: Self-pay

## 2023-07-29 ENCOUNTER — Encounter: Payer: Self-pay | Admitting: Nurse Practitioner

## 2023-07-29 ENCOUNTER — Other Ambulatory Visit (INDEPENDENT_AMBULATORY_CARE_PROVIDER_SITE_OTHER): Payer: BC Managed Care – PPO

## 2023-07-29 DIAGNOSIS — N289 Disorder of kidney and ureter, unspecified: Secondary | ICD-10-CM

## 2023-07-29 DIAGNOSIS — E875 Hyperkalemia: Secondary | ICD-10-CM

## 2023-07-29 LAB — BASIC METABOLIC PANEL
BUN: 112 mg/dL (ref 6–23)
CO2: 14 meq/L — ABNORMAL LOW (ref 19–32)
Calcium: 8 mg/dL — ABNORMAL LOW (ref 8.4–10.5)
Chloride: 113 meq/L — ABNORMAL HIGH (ref 96–112)
Creatinine, Ser: 6.42 mg/dL (ref 0.40–1.50)
GFR: 8.65 mL/min — CL (ref >60.00)
Glucose, Bld: 110 mg/dL — ABNORMAL HIGH (ref 70–99)
Potassium: 5.2 meq/L — ABNORMAL HIGH (ref 3.5–5.1)
Sodium: 137 meq/L (ref 135–145)

## 2023-07-29 NOTE — Telephone Encounter (Signed)
CRITICAL VALUE STICKER  CRITICAL VALUE: BUN 112, Creatinine 6.42 GFR 6.42  RECEIVER (on-site recipient of call): Karie Georges, CMA  DATE & TIME NOTIFIED: 07-29-23 3:25 pm  MESSENGER (representative from lab): Sia from [facility]  MD NOTIFIED: Audria Nine NP  TIME OF NOTIFICATION:3:25 pm  RESPONSE: TBD once Audria Nine, NP is done with clinic this afternoon.

## 2023-07-29 NOTE — Telephone Encounter (Signed)
Noted

## 2023-08-01 NOTE — Telephone Encounter (Signed)
Pt has been scheduled for OV 08/05/23.

## 2023-08-01 NOTE — Telephone Encounter (Signed)
Can we tell him to stop the losartan and schedule a visit with me on friday or Thursday please

## 2023-08-03 ENCOUNTER — Telehealth: Payer: Self-pay | Admitting: Pharmacy Technician

## 2023-08-03 ENCOUNTER — Other Ambulatory Visit (HOSPITAL_COMMUNITY): Payer: Self-pay

## 2023-08-03 NOTE — Telephone Encounter (Signed)
Pharmacy Patient Advocate Encounter   Received notification from CoverMyMeds that prior authorization for Repatha is required/requested.   Insurance verification completed.   The patient is insured through [facility]  .   Per test claim: PA required; PA submitted to above mentioned insurance via CoverMyMeds Key/confirmation #/EOC [id number] Status is pending

## 2023-08-03 NOTE — Telephone Encounter (Signed)
Pharmacy Patient Advocate Encounter  Received notification from [facility] [place]  that Prior Authorization for Repatha has been APPROVED from 08/03/23 to 08/02/24   PA #/Case ID/Reference #: [id number]

## 2023-08-05 ENCOUNTER — Telehealth: Payer: Self-pay | Admitting: *Deleted

## 2023-08-05 ENCOUNTER — Ambulatory Visit: Payer: BC Managed Care – PPO | Admitting: Nurse Practitioner

## 2023-08-05 VITALS — BP 138/58 | HR 78 | Temp 98.2°F | Ht 70.0 in | Wt 241.4 lb

## 2023-08-05 DIAGNOSIS — I10 Essential (primary) hypertension: Secondary | ICD-10-CM

## 2023-08-05 DIAGNOSIS — E875 Hyperkalemia: Secondary | ICD-10-CM

## 2023-08-05 LAB — BASIC METABOLIC PANEL
BUN: 106 mg/dL (ref 6–23)
CO2: 16 meq/L — ABNORMAL LOW (ref 19–32)
Calcium: 7.8 mg/dL — ABNORMAL LOW (ref 8.4–10.5)
Chloride: 113 meq/L — ABNORMAL HIGH (ref 96–112)
Creatinine, Ser: 5.47 mg/dL (ref 0.40–1.50)
GFR: 10.48 mL/min — CL (ref >60.00)
Glucose, Bld: 114 mg/dL — ABNORMAL HIGH (ref 70–99)
Potassium: 4.8 meq/L (ref 3.5–5.1)
Sodium: 140 meq/L (ref 135–145)

## 2023-08-05 NOTE — Patient Instructions (Signed)
Nice to see you today EKG looked good in office  Stay off the losartan and continue to check blood pressure at home Avoid foods high in potassium (list attached)

## 2023-08-05 NOTE — Telephone Encounter (Signed)
This is improving. Can we deliver the lab information to [facility]. Dr Signe Colt who is the patients nephrologist

## 2023-08-05 NOTE — Assessment & Plan Note (Addendum)
Hyperkalemia has been improving with each blood draw.  In the setting of chronic renal disease patient is being prepped for dialysis and has appointments coming up this month for fistula mapping.  Pending BMP today.  EKG within normal limits

## 2023-08-05 NOTE — Assessment & Plan Note (Signed)
Discontinue losartan 100 mg daily.  In the setting of hyperkalemia.  Blood pressure still well-controlled.  Continue Maxide at this juncture

## 2023-08-05 NOTE — Telephone Encounter (Signed)
Saa with [place] lab called with Critical results.  Critical BUN 106 Critical Creatinine 5.47 Critical GFR 10.48

## 2023-08-05 NOTE — Progress Notes (Signed)
Established Patient Office Visit  Subjective   Patient ID: David Cowan, male    DOB: 24-Mar-1961  Age: 63 y.o. MRN: [id number]  Chief Complaint  Patient presents with   Follow-up    Pt complains of feeling much better. States diarrhea stopped. Has not taken BP in past two days.      HPI   Diarrhea/hyperkalemia: patietn was seen by me on 07/27/2023 for diarrhea. I checked electrolytes and renal function as he has a history of CKD. Patient was found to be hyperkalemic with a potassium of 6.0. he was instructed to come and get it checked again. It trended down to 5.5 and then again to 5.1. I did forward the labs to Dr. Signe Colt. He was told to discontinue his losartan and to follow up today in office   He did stop the losartan that was 100mg  daily. States that Wednesday  was the first day with out diarrhea. He is feeling much better since last time he was in office.  States he will hear back from nephrology and they told to get continue the Prilosec.  Patient is also on Maxide.  He is still making urine.  But states the stream is weaker   Review of Systems  Constitutional:  Negative for chills and fever.  Respiratory:  Negative for shortness of breath.   Cardiovascular:  Negative for chest pain.  Gastrointestinal:  Positive for abdominal pain (burning sensation).  Neurological:  Negative for headaches.      Objective:     BP (!) 138/58   Pulse 78   Temp 98.2 F (36.8 C) (Oral)   Ht 5\' 10"  (1.778 m)   Wt 241 lb 6.4 oz (109.5 kg)   SpO2 96%   BMI 34.64 kg/m  BP Readings from Last 3 Encounters:  08/05/23 (!) 138/58  07/27/23 (!) 102/50  11/10/22 134/76   Wt Readings from Last 3 Encounters:  08/05/23 241 lb 6.4 oz (109.5 kg)  07/27/23 248 lb 3.2 oz (112.6 kg)  11/10/22 255 lb 8 oz (115.9 kg)   SpO2 Readings from Last 3 Encounters:  08/05/23 96%  07/27/23 99%  11/10/22 94%      Physical Exam Vitals and nursing note reviewed.  Constitutional:      Appearance:  Normal appearance.  Cardiovascular:     Rate and Rhythm: Normal rate and regular rhythm.     Heart sounds: Normal heart sounds.  Pulmonary:     Effort: Pulmonary effort is normal.     Breath sounds: Normal breath sounds.  Abdominal:     General: Bowel sounds are normal. There is no distension.     Palpations: There is no mass.     Tenderness: There is no abdominal tenderness.     Hernia: No hernia is present.  Neurological:     Mental Status: He is alert.      No results found for any visits on 08/05/23.    The ASCVD Risk score (Arnett DK, et al., 2019) failed to calculate for the following reasons:   Risk score cannot be calculated because patient has a medical history suggesting prior/existing ASCVD    Assessment & Plan:   Problem List Items Addressed This Visit       Cardiovascular and Mediastinum   Essential hypertension   Discontinue losartan 100 mg daily.  In the setting of hyperkalemia.  Blood pressure still well-controlled.  Continue Maxide at this juncture        Other   Hyperkalemia -  Primary   Hyperkalemia has been improving with each blood draw.  In the setting of chronic renal disease patient is being prepped for dialysis and has appointments coming up this month for fistula mapping.  Pending BMP today.  EKG within normal limits      Relevant Orders   Basic Metabolic Panel   EKG 12-Lead (Completed)    Return if symptoms worsen or fail to improve.    Audria Nine, NP

## 2023-08-07 ENCOUNTER — Encounter: Payer: Self-pay | Admitting: Nurse Practitioner

## 2023-08-08 NOTE — Telephone Encounter (Signed)
 Have faxed results as requested. Do I need to contact patient with any information?

## 2023-08-08 NOTE — Telephone Encounter (Signed)
 I sent a results note to the patient and he has read it 08/07/2023 at 10:18

## 2023-08-09 ENCOUNTER — Other Ambulatory Visit: Payer: Self-pay | Admitting: Cardiovascular Disease

## 2023-08-09 DIAGNOSIS — E785 Hyperlipidemia, unspecified: Secondary | ICD-10-CM

## 2023-08-09 DIAGNOSIS — I10 Essential (primary) hypertension: Secondary | ICD-10-CM

## 2023-08-09 DIAGNOSIS — I2510 Atherosclerotic heart disease of native coronary artery without angina pectoris: Secondary | ICD-10-CM

## 2023-08-10 ENCOUNTER — Other Ambulatory Visit: Payer: Self-pay

## 2023-08-10 DIAGNOSIS — I739 Peripheral vascular disease, unspecified: Secondary | ICD-10-CM

## 2023-08-14 ENCOUNTER — Encounter: Payer: Self-pay | Admitting: *Deleted

## 2023-08-14 ENCOUNTER — Other Ambulatory Visit: Payer: Self-pay

## 2023-08-14 ENCOUNTER — Ambulatory Visit: Admission: EM | Admit: 2023-08-14 | Discharge: 2023-08-14 | Disposition: A | Discharge status: Home / Self Care

## 2023-08-14 DIAGNOSIS — N179 Acute kidney failure, unspecified: Secondary | ICD-10-CM | POA: Diagnosis not present

## 2023-08-14 DIAGNOSIS — B029 Zoster without complications: Secondary | ICD-10-CM

## 2023-08-14 MED ORDER — VALACYCLOVIR HCL 1 G PO TABS: 1000.0000 mg | ORAL_TABLET | Freq: Every day | ORAL | 0 refills | Status: AC

## 2023-08-14 NOTE — ED Triage Notes (Signed)
 Pt has a rash on RT flank areas that are dry.. [facility]. Pt unsure if he should go to class.

## 2023-08-15 ENCOUNTER — Telehealth: Payer: Self-pay

## 2023-08-15 ENCOUNTER — Ambulatory Visit

## 2023-08-15 NOTE — Telephone Encounter (Signed)
 Copied from CRM [id number]. Topic: Clinical - Medical Advice >> Aug 15, 2023  3:29 PM David Cowan wrote: Reason for CRM: Patient would like to know when he could get the vaccine for shingles due to being Diagnosed with shingles as of 08/13/23. Located on side not open or oozing and also would like to know regarding Dialysis. Please call [phone]

## 2023-08-15 NOTE — Telephone Encounter (Signed)
 Returned pt call and informed pt that he has to wait at least 6 months from shingles diagnosis date of 08/13/23.  Pt also wanted to know if he was able to attend his dialysis appointment.  Advised pt to contact their office to see what the recommendations are.  Pt verbalized understanding and has no further questions or concerns.

## 2023-08-15 NOTE — ED Provider Notes (Signed)
 [facility]    CSN: [id number] Arrival date & time: 08/14/23  0856      History   Chief Complaint Chief Complaint  Patient presents with   Rash    HPI David Cowan is a 63 y.o. male.   Patient here today for evaluation of rash to his right flank area that he first noticed a few days ago. Area is somewhat painful. He does not report any fever.  The history is provided by the patient.  Rash Associated symptoms: no fever and no shortness of breath     Past Medical History:  Diagnosis Date   Arthritis    Bronchitis    CAD (coronary artery disease)    Dyslipidemia    Hand pain    Hard of hearing    left ear   History of MI (myocardial infarction)    Hyperlipidemia    Hypertension    Myocardial infarction [facility]) 2000   Right flank pain     Patient Active Problem List   Diagnosis Date Noted   Hyperkalemia 08/05/2023   Epigastric pain 07/27/2023   Diarrhea of infectious origin 07/27/2023   Decreased renal function 07/27/2023   Fatigue 11/10/2022   Former tobacco use 11/10/2022   Upper respiratory tract infection 06/10/2022   Acute cough 06/10/2022   Chronic obstructive pulmonary disease (HCC) 03/11/2022   Acute bronchitis with COPD (HCC) 03/03/2022   Wheezing 03/02/2022   PVD (peripheral vascular disease) (HCC) 02/22/2018   Acute renal failure (HCC) 02/19/2016   Hypotension 02/19/2016   Breast mass, right 02/19/2016   Obesity (BMI 30-39.9) 12/11/2014   S/P left THA, AA 12/10/2014   Allergic rhinitis 03/27/2014   Preventative health care 03/27/2014   OA (osteoarthritis) of hip 03/27/2014   Tobacco abuse 12/19/2013   MYOCARDIAL INFARCTION, HX OF 07/10/2010   HYPERGLYCEMIA 07/10/2010   Hyperlipidemia 03/13/2009   Essential hypertension 03/13/2009   Coronary atherosclerosis 03/13/2009    Past Surgical History:  Procedure Laterality Date   CARDIAC CATHETERIZATION     RENAL BIOPSY     TOTAL HIP ARTHROPLASTY Left 12/10/2014   Procedure: LEFT  TOTAL HIP ARTHROPLASTY ANTERIOR APPROACH;  Surgeon: [name], MD;  Location: [facility];  Service: Orthopedics;  Laterality: Left;   VASECTOMY         Home Medications    Prior to Admission medications   Medication Sig Start Date End Date Taking? Authorizing Provider  albuterol (VENTOLIN HFA) 108 (90 Base) MCG/ACT inhaler Inhale 2 puffs into the lungs every 6 (six) hours as needed for wheezing or shortness of breath. 03/11/22  Yes [name], NP  aspirin 81 MG tablet Take 1 tablet (81 mg total) by mouth daily. 03/21/18  Yes Iran Ouch, MD  cetirizine (ZYRTEC) 10 MG tablet Take 10 mg by mouth at bedtime.   Yes [provider]  cilostazol (PLETAL) 100 MG tablet TAKE 1 TABLET BY MOUTH TWICE A DAY 02/07/23  Yes Iran Ouch, MD  Evolocumab (REPATHA SURECLICK) 140 MG/ML SOAJ INJECT 140 MG INTO THE SKIN EVERY 14 (FOURTEEN) DAYS. 07/19/23  Yes Iran Ouch, MD  fenofibrate 160 MG tablet TAKE 1 TABLET BY MOUTH EVERY DAY 02/25/23  Yes Marinus Maw, MD  fluticasone [facility]) 50 MCG/ACT nasal spray Place 2 sprays into both nostrils at bedtime. 03/02/22  Yes Dugal, Wyatt Mage, FNP  losartan (COZAAR) 100 MG tablet Take 100 mg by mouth daily. 08/09/23  Yes [provider]  metoprolol succinate (TOPROL-XL) 100 MG 24  hr tablet TAKE 1 TABLET BY MOUTH AT BEDTIME. TAKE WITH OR IMMEDIATELY FOLLOWING A MEAL. 01/17/23  Yes Iran Ouch, MD  omeprazole (PRILOSEC) 20 MG capsule Take 1 capsule (20 mg total) by mouth daily. 07/27/23  Yes [name], NP  rosuvastatin (CRESTOR) 40 MG tablet TAKE 1 TABLET BY MOUTH EVERY DAY 08/09/23  Yes Iran Ouch, MD  triamterene-hydrochlorothiazide (MAXZIDE-25) 37.5-25 MG tablet TAKE 1 TABLET BY MOUTH EVERY DAY 08/09/23  Yes Iran Ouch, MD  valACYclovir (VALTREX) 1000 MG tablet Take 1 tablet (1,000 mg total) by mouth daily for 7 days. 08/14/23 08/21/23 Yes Tomi Bamberger, PA-C  Vitamin D, Ergocalciferol, (DRISDOL) 1.25 MG (50000 UNIT)  CAPS capsule Take 1 capsule (50,000 Units total) by mouth every 7 (seven) days. 11/12/22  Yes [name], NP  clobetasol ointment (TEMOVATE) 0.05 % Apply topically. 06/16/23   [provider]  nitroGLYCERIN (NITROSTAT) 0.4 MG SL tablet Place 1 tablet (0.4 mg total) under the tongue every 5 (five) minutes as needed for chest pain. 03/04/20   Iran Ouch, MD  PROTEIN PO Take 1 Can by mouth daily. Protein shake    [provider]    Family History Family History  Problem Relation Age of Onset   Congestive Heart Failure Mother 19   Lung cancer Mother    Heart attack Father 65   Factor V Leiden deficiency Sister    Factor V Leiden deficiency Brother    Coronary artery disease Other         fam hx, early   Heart attack Other 43       uncle   Colon cancer Neg Hx     Social History Social History   Tobacco Use   Smoking status: Former    Current packs/day: 0.00    Average packs/day: 1 pack/day for 49.0 years (49.0 ttl pk-yrs)    Types: Cigarettes    Start date: 05/09/1973    Quit date: 05/09/2022    Years since quitting: 1.2   Smokeless tobacco: Never  Vaping Use   Vaping status: Never Used  Substance Use Topics   Alcohol use: Yes    Comment: once a week at 3 beers   Drug use: Not Currently    Types: Marijuana    Comment: once a week     Allergies   Cucumber extract, Amoxicillin, and Penicillins   Review of Systems Review of Systems  Constitutional:  Negative for chills and fever.  Eyes:  Negative for discharge and redness.  Respiratory:  Negative for shortness of breath.   Skin:  Positive for color change and rash. Negative for wound.  Neurological:  Negative for numbness.     Physical Exam Triage Vital Signs ED Triage Vitals  Encounter Vitals Group     BP 08/14/23 0948 (!) 147/72     Systolic BP Percentile --      Diastolic BP Percentile --      Pulse Rate 08/14/23 0948 63     Resp 08/14/23 0948 18     Temp 08/14/23 0948 98 F (36.7  C)     Temp src --      SpO2 08/14/23 0948 96 %     Weight --      Height --      Head Circumference --      Peak Flow --      Pain Score 08/14/23 0943 0     Pain Loc --  Pain Education --      Exclude from Growth Chart --    No data found.  Updated Vital Signs BP (!) 147/72   Pulse 63   Temp 98 F (36.7 C)   Resp 18   SpO2 96%   Visual Acuity Right Eye Distance:   Left Eye Distance:   Bilateral Distance:    Right Eye Near:   Left Eye Near:    Bilateral Near:     Physical Exam Vitals and nursing note reviewed.  Constitutional:      General: He is not in acute distress.    Appearance: Normal appearance. He is not ill-appearing.  HENT:     Head: Normocephalic and atraumatic.  Eyes:     Conjunctiva/sclera: Conjunctivae normal.  Cardiovascular:     Rate and Rhythm: Normal rate.  Pulmonary:     Effort: Pulmonary effort is normal. No respiratory distress.  Skin:    Comments: Cluster of erythematous vesicular lesions to right flank following dermatomal pattern.  Neurological:     Mental Status: He is alert.  Psychiatric:        Mood and Affect: Mood normal.        Behavior: Behavior normal.        Thought Content: Thought content normal.      UC Treatments / Results  Labs (all labs ordered are listed, but only abnormal results are displayed) Labs Reviewed - No data to display  EKG   Radiology No results found.  Procedures Procedures (including critical care time)  Medications Ordered in UC Medications - No data to display  Initial Impression / Assessment and Plan / UC Course  I have reviewed the triage vital signs and the nursing notes.  Pertinent labs & imaging results that were available during my care of the patient were reviewed by me and considered in my medical decision making (see chart for details).    Rash consistent with shingles. Valtrex prescribed at kidney adjusted dose based on most recent creatinine. Advised he notify leader  of Kidney training class for further instruction regarding whether he is able to attend with shingles.   Final Clinical Impressions(s) / UC Diagnoses   Final diagnoses:  Herpes zoster without complication  Acute renal failure, unspecified acute renal failure type [facility])   Discharge Instructions   None    ED Prescriptions     Medication Sig Dispense Auth. Provider   valACYclovir (VALTREX) 1000 MG tablet Take 1 tablet (1,000 mg total) by mouth daily for 7 days. 7 tablet Tomi Bamberger, PA-C      PDMP not reviewed this encounter.   Tomi Bamberger, PA-C 08/15/23 1428

## 2023-08-18 ENCOUNTER — Other Ambulatory Visit: Payer: Self-pay | Admitting: Nurse Practitioner

## 2023-08-18 DIAGNOSIS — R1013 Epigastric pain: Secondary | ICD-10-CM

## 2023-08-18 NOTE — H&P (View-Only) (Signed)
 Patient ID: David Cowan, male   DOB: March 30, 1961, 63 y.o.   MRN: [id number]  Reason for Consult: New Patient (Initial Visit)   Referred by David Emms, NP  Subjective:     HPI  David Cowan is a 63 y.o. male who presents for evaluation HD access creation.  He also reports he does have claudication and has been seen by Dr. [name].  He denies rest pain or tissue loss.  He is on Pletal.  He reports he can ambulate and work without much limitation unless he walks extensive distances.  Past Medical History: No date: Arthritis No date: Bronchitis No date: CAD (coronary artery disease) No date: Dyslipidemia No date: Hand pain No date: Hard of hearing     Comment:  left ear No date: History of MI (myocardial infarction) No date: Hyperlipidemia No date: Hypertension 2000: Myocardial infarction (HCC) No date: Right flank pain  Family History  Problem Relation Age of Onset   Congestive Heart Failure Mother 80   Lung cancer Mother    Heart attack Father 7   Factor V Leiden deficiency Sister    Factor V Leiden deficiency Brother    Coronary artery disease Other         fam hx, early   Heart attack Other 59       uncle   Colon cancer Neg Hx    Past Surgical History:  Procedure Laterality Date   CARDIAC CATHETERIZATION     RENAL BIOPSY     TOTAL HIP ARTHROPLASTY Left 12/10/2014   Procedure: LEFT TOTAL HIP ARTHROPLASTY ANTERIOR APPROACH;  Surgeon: [name], MD;  Location: [facility];  Service: Orthopedics;  Laterality: Left;   VASECTOMY      Short Social History:  Social History   Tobacco Use   Smoking status: Former    Current packs/day: 0.00    Average packs/day: 1 pack/day for 49.0 years (49.0 ttl pk-yrs)    Types: Cigarettes    Start date: 05/09/1973    Quit date: 05/09/2022    Years since quitting: 1.2   Smokeless tobacco: Never  Substance Use Topics   Alcohol use: Yes    Comment: once a week at 3 beers    Allergies  Allergen Reactions   Cucumber  Extract Anaphylaxis and Other (See Comments)    "Black out"   Amoxicillin Rash   Penicillins Rash    Current Outpatient Medications  Medication Sig Dispense Refill   albuterol (VENTOLIN HFA) 108 (90 Base) MCG/ACT inhaler Inhale 2 puffs into the lungs every 6 (six) hours as needed for wheezing or shortness of breath. 8 g 0   aspirin 81 MG tablet Take 1 tablet (81 mg total) by mouth daily. 90 tablet 3   cetirizine (ZYRTEC) 10 MG tablet Take 10 mg by mouth at bedtime.     cilostazol (PLETAL) 100 MG tablet TAKE 1 TABLET BY MOUTH TWICE A DAY 180 tablet 3   clobetasol ointment (TEMOVATE) 0.05 % Apply topically.     Evolocumab (REPATHA SURECLICK) 140 MG/ML SOAJ INJECT 140 MG INTO THE SKIN EVERY 14 (FOURTEEN) DAYS. 6 mL 3   fenofibrate 160 MG tablet TAKE 1 TABLET BY MOUTH EVERY DAY 90 tablet 3   fluticasone (FLONASE) 50 MCG/ACT nasal spray Place 2 sprays into both nostrils at bedtime.     losartan (COZAAR) 100 MG tablet Take 100 mg by mouth daily.     metoprolol succinate (TOPROL-XL) 100 MG 24 hr tablet TAKE 1 TABLET BY  MOUTH AT BEDTIME. TAKE WITH OR IMMEDIATELY FOLLOWING A MEAL. 90 tablet 3   nitroGLYCERIN (NITROSTAT) 0.4 MG SL tablet Place 1 tablet (0.4 mg total) under the tongue every 5 (five) minutes as needed for chest pain. 25 tablet 3   omeprazole (PRILOSEC) 20 MG capsule Take 1 capsule (20 mg total) by mouth daily. 30 capsule 0   PROTEIN PO Take 1 Can by mouth daily. Protein shake     rosuvastatin (CRESTOR) 40 MG tablet TAKE 1 TABLET BY MOUTH EVERY DAY 90 tablet 3   triamterene-hydrochlorothiazide (MAXZIDE-25) 37.5-25 MG tablet TAKE 1 TABLET BY MOUTH EVERY DAY 90 tablet 3   valACYclovir (VALTREX) 1000 MG tablet Take 1 tablet (1,000 mg total) by mouth daily for 7 days. 7 tablet 0   Vitamin D, Ergocalciferol, (DRISDOL) 1.25 MG (50000 UNIT) CAPS capsule Take 1 capsule (50,000 Units total) by mouth every 7 (seven) days. 12 capsule 0   No current facility-administered medications for this visit.     REVIEW OF SYSTEMS   All other systems were reviewed and are negative     Objective:  Objective   Vitals:   08/19/23 1011  BP: (!) 145/84  Pulse: 65  Resp: 20  Temp: 97.9 F (36.6 C)  SpO2: 96%  Weight: 242 lb (109.8 kg)  Height: 5\' 10"  (1.778 m)   Body mass index is 34.72 kg/m.  Physical Exam General: no acute distress Cardiac: hemodynamically stable Pulm: normal work of breathing Neuro: alert, no focal deficit Extremities: No edema, cyanosis or wounds. Vascular:   Right: palpable brachial, radial  Left: palpable brachial, radial   Data: UE arterial duplex Right Pre-Dialysis Findings:  +----------------------------+---------+-------------------+----------+----  ----+  Location                   PSV      Intralum. Diam.    Waveform   Comments                             (cm/s)   (cm)                                    +----------------------------+---------+-------------------+----------+----  ----+  Radial artery distal upper  65       0.4                triphasic            arm                                                                          +----------------------------+---------+-------------------+----------+----  ----+  Ulnar artery distal upper   41       0.2                triphasic            arm                                                                          +----------------------------+---------+-------------------+----------+----  ----+  Brachial Antecub. fossa     58       0.54               triphasic            +----------------------------+---------+-------------------+----------+----  ----+  Radial Art at Wrist         77       0.38               triphasic            +----------------------------+---------+-------------------+----------+----  ----+  Ulnar Art at Wrist          41       0.15               triphasic             +----------------------------+---------+-------------------+----------+----  ----+       Left Pre-Dialysis Findings:  +----------------------------+---------+-------------------+----------+----  ----+  Location                   PSV      Intralum. Diam.    Waveform   Comments                             (cm/s)   (cm)                                    +----------------------------+---------+-------------------+----------+----  ----+  Radial artery distal upper  82       0.4                triphasic            arm                                                                          +----------------------------+---------+-------------------+----------+----  ----+  Ulnar artery distal upper   52       0.3                triphasic            arm                                                                          +----------------------------+---------+-------------------+----------+----  ----+  Brachial Antecub. fossa     86       0.63               triphasic            +----------------------------+---------+-------------------+----------+----  ----+  Radial Art at Wrist         70       0.35               triphasic            +----------------------------+---------+-------------------+----------+----  ----+  Ulnar Art at  Wrist          63       0.27               triphasic            +----------------------------+---------+-------------------+----------+----  ----+     Vein mapping +-----------------+-------------+----------+--------+  Right Cephalic   Diameter (cm)Depth (cm)Findings  +-----------------+-------------+----------+--------+  Shoulder            0.43        0.89             +-----------------+-------------+----------+--------+  Prox upper arm       0.35        0.46             +-----------------+-------------+----------+--------+  Mid upper arm        0.35        0.20              +-----------------+-------------+----------+--------+  Dist upper arm       0.54        0.26             +-----------------+-------------+----------+--------+  Antecubital fossa    0.46        0.38             +-----------------+-------------+----------+--------+  Prox forearm         0.37        0.32             +-----------------+-------------+----------+--------+  Mid forearm          0.37        0.17             +-----------------+-------------+----------+--------+  Dist forearm         0.36        0.18             +-----------------+-------------+----------+--------+  Wrist               0.35        0.11             +-----------------+-------------+----------+--------+   +-----------------+-------------+----------+--------+  Right Basilic    Diameter (cm)Depth (cm)Findings  +-----------------+-------------+----------+--------+  Prox upper arm       0.71        1.00             +-----------------+-------------+----------+--------+  Mid upper arm        0.65        0.63             +-----------------+-------------+----------+--------+  Dist upper arm       0.32        0.43             +-----------------+-------------+----------+--------+  Antecubital fossa    0.41        0.42             +-----------------+-------------+----------+--------+  Prox forearm         0.37        0.14             +-----------------+-------------+----------+--------+  Mid forearm          0.30        0.17             +-----------------+-------------+----------+--------+  Distal forearm       0.30        0.18             +-----------------+-------------+----------+--------+  Wrist               0.30        0.16             +-----------------+-------------+----------+--------+   +-----------------+-------------+----------+--------+  Left Cephalic    Diameter (cm)Depth (cm)Findings   +-----------------+-------------+----------+--------+  Shoulder            0.32        0.51             +-----------------+-------------+----------+--------+  Prox upper arm       0.36        0.26             +-----------------+-------------+----------+--------+  Mid upper arm        0.40        0.30             +-----------------+-------------+----------+--------+  Dist upper arm       0.55        0.23             +-----------------+-------------+----------+--------+  Antecubital fossa    0.48        0.35             +-----------------+-------------+----------+--------+  Prox forearm         0.33        0.48             +-----------------+-------------+----------+--------+  Mid forearm          0.33        0.37             +-----------------+-------------+----------+--------+  Dist forearm         0.29        0.22             +-----------------+-------------+----------+--------+  Wrist               0.32        0.23             +-----------------+-------------+----------+--------+   +-----------------+-------------+----------+---------+  Left Basilic     Diameter (cm)Depth (cm)Findings   +-----------------+-------------+----------+---------+  Prox upper arm       0.74        1.10              +-----------------+-------------+----------+---------+  Mid upper arm        0.68        0.65              +-----------------+-------------+----------+---------+  Dist upper arm       0.37        0.52   branching  +-----------------+-------------+----------+---------+  Antecubital fossa    0.41        0.48              +-----------------+-------------+----------+---------+  Prox forearm         0.30        0.17              +-----------------+-------------+----------+---------+  Mid forearm          0.29        0.18              +-----------------+-------------+----------+---------+  Distal forearm       0.22         0.21   branching  +-----------------+-------------+----------+---------+  Wrist               0.23  0.27              +-----------------+-------------+----------+---------+   Note from nephrologist reviewed       Assessment/Plan:     JAQUAY POSTHUMUS is a 63 y.o. male with CKD stage V who presents to discuss permanent access creation.  Dominant hand: left Previous access surgeries: none Previous catheters: none Other arm surgeries/injuries: none  The vein mapping suggests there is suitable superficial veins bilaterally and we discussed that they are a candidate for AV fistula  The risks an benefits including of access creation were reviewed including: need for additional procedures, need for additional creations, steal, ischemia monomelic neuropathy, failure of access, and bleeding. The patient expressed understand and is willing to proceed.  I explained that I will perform intraoperative vein mapping to confirm the above findings and will determine the most appropriate access to create but with a preoperative plan for right arm brachiocephalic fistula creation.  In regards to his PAD, I explained that I agree with Dr. [name]. He is a claudicant and is not limited about his pain, he does not warrant intervention at this time.      [name] MD [facility]

## 2023-08-18 NOTE — Progress Notes (Signed)
 Patient ID: David Cowan, male   DOB: March 30, 1961, 63 y.o.   MRN: [id number]  Reason for Consult: New Patient (Initial Visit)   Referred by [name], NP  Subjective:     HPI  David Cowan is a 63 y.o. male who presents for evaluation HD access creation.  He also reports he does have claudication and has been seen by Dr. [name].  He denies rest pain or tissue loss.  He is on Pletal.  He reports he can ambulate and work without much limitation unless he walks extensive distances.  Past Medical History: No date: Arthritis No date: Bronchitis No date: CAD (coronary artery disease) No date: Dyslipidemia No date: Hand pain No date: Hard of hearing     Comment:  left ear No date: History of MI (myocardial infarction) No date: Hyperlipidemia No date: Hypertension 2000: Myocardial infarction (HCC) No date: Right flank pain  Family History  Problem Relation Age of Onset   Congestive Heart Failure Mother 80   Lung cancer Mother    Heart attack Father 7   Factor V Leiden deficiency Sister    Factor V Leiden deficiency Brother    Coronary artery disease Other         fam hx, early   Heart attack Other 59       uncle   Colon cancer Neg Hx    Past Surgical History:  Procedure Laterality Date   CARDIAC CATHETERIZATION     RENAL BIOPSY     TOTAL HIP ARTHROPLASTY Left 12/10/2014   Procedure: LEFT TOTAL HIP ARTHROPLASTY ANTERIOR APPROACH;  Surgeon: [name], MD;  Location: [facility];  Service: Orthopedics;  Laterality: Left;   VASECTOMY      Short Social History:  Social History   Tobacco Use   Smoking status: Former    Current packs/day: 0.00    Average packs/day: 1 pack/day for 49.0 years (49.0 ttl pk-yrs)    Types: Cigarettes    Start date: 05/09/1973    Quit date: 05/09/2022    Years since quitting: 1.2   Smokeless tobacco: Never  Substance Use Topics   Alcohol use: Yes    Comment: once a week at 3 beers    Allergies  Allergen Reactions   Cucumber  Extract Anaphylaxis and Other (See Comments)    "Black out"   Amoxicillin Rash   Penicillins Rash    Current Outpatient Medications  Medication Sig Dispense Refill   albuterol (VENTOLIN HFA) 108 (90 Base) MCG/ACT inhaler Inhale 2 puffs into the lungs every 6 (six) hours as needed for wheezing or shortness of breath. 8 g 0   aspirin 81 MG tablet Take 1 tablet (81 mg total) by mouth daily. 90 tablet 3   cetirizine (ZYRTEC) 10 MG tablet Take 10 mg by mouth at bedtime.     cilostazol (PLETAL) 100 MG tablet TAKE 1 TABLET BY MOUTH TWICE A DAY 180 tablet 3   clobetasol ointment (TEMOVATE) 0.05 % Apply topically.     Evolocumab (REPATHA SURECLICK) 140 MG/ML SOAJ INJECT 140 MG INTO THE SKIN EVERY 14 (FOURTEEN) DAYS. 6 mL 3   fenofibrate 160 MG tablet TAKE 1 TABLET BY MOUTH EVERY DAY 90 tablet 3   fluticasone (FLONASE) 50 MCG/ACT nasal spray Place 2 sprays into both nostrils at bedtime.     losartan (COZAAR) 100 MG tablet Take 100 mg by mouth daily.     metoprolol succinate (TOPROL-XL) 100 MG 24 hr tablet TAKE 1 TABLET BY  MOUTH AT BEDTIME. TAKE WITH OR IMMEDIATELY FOLLOWING A MEAL. 90 tablet 3   nitroGLYCERIN (NITROSTAT) 0.4 MG SL tablet Place 1 tablet (0.4 mg total) under the tongue every 5 (five) minutes as needed for chest pain. 25 tablet 3   omeprazole (PRILOSEC) 20 MG capsule Take 1 capsule (20 mg total) by mouth daily. 30 capsule 0   PROTEIN PO Take 1 Can by mouth daily. Protein shake     rosuvastatin (CRESTOR) 40 MG tablet TAKE 1 TABLET BY MOUTH EVERY DAY 90 tablet 3   triamterene-hydrochlorothiazide (MAXZIDE-25) 37.5-25 MG tablet TAKE 1 TABLET BY MOUTH EVERY DAY 90 tablet 3   valACYclovir (VALTREX) 1000 MG tablet Take 1 tablet (1,000 mg total) by mouth daily for 7 days. 7 tablet 0   Vitamin D, Ergocalciferol, (DRISDOL) 1.25 MG (50000 UNIT) CAPS capsule Take 1 capsule (50,000 Units total) by mouth every 7 (seven) days. 12 capsule 0   No current facility-administered medications for this visit.     REVIEW OF SYSTEMS   All other systems were reviewed and are negative     Objective:  Objective   Vitals:   08/19/23 1011  BP: (!) 145/84  Pulse: 65  Resp: 20  Temp: 97.9 F (36.6 C)  SpO2: 96%  Weight: 242 lb (109.8 kg)  Height: 5\' 10"  (1.778 m)   Body mass index is 34.72 kg/m.  Physical Exam General: no acute distress Cardiac: hemodynamically stable Pulm: normal work of breathing Neuro: alert, no focal deficit Extremities: No edema, cyanosis or wounds. Vascular:   Right: palpable brachial, radial  Left: palpable brachial, radial   Data: UE arterial duplex Right Pre-Dialysis Findings:  +----------------------------+---------+-------------------+----------+----  ----+  Location                   PSV      Intralum. Diam.    Waveform   Comments                             (cm/s)   (cm)                                    +----------------------------+---------+-------------------+----------+----  ----+  Radial artery distal upper  65       0.4                triphasic            arm                                                                          +----------------------------+---------+-------------------+----------+----  ----+  Ulnar artery distal upper   41       0.2                triphasic            arm                                                                          +----------------------------+---------+-------------------+----------+----  ----+  Brachial Antecub. fossa     58       0.54               triphasic            +----------------------------+---------+-------------------+----------+----  ----+  Radial Art at Wrist         77       0.38               triphasic            +----------------------------+---------+-------------------+----------+----  ----+  Ulnar Art at Wrist          41       0.15               triphasic             +----------------------------+---------+-------------------+----------+----  ----+       Left Pre-Dialysis Findings:  +----------------------------+---------+-------------------+----------+----  ----+  Location                   PSV      Intralum. Diam.    Waveform   Comments                             (cm/s)   (cm)                                    +----------------------------+---------+-------------------+----------+----  ----+  Radial artery distal upper  82       0.4                triphasic            arm                                                                          +----------------------------+---------+-------------------+----------+----  ----+  Ulnar artery distal upper   52       0.3                triphasic            arm                                                                          +----------------------------+---------+-------------------+----------+----  ----+  Brachial Antecub. fossa     86       0.63               triphasic            +----------------------------+---------+-------------------+----------+----  ----+  Radial Art at Wrist         70       0.35               triphasic            +----------------------------+---------+-------------------+----------+----  ----+  Ulnar Art at  Wrist          63       0.27               triphasic            +----------------------------+---------+-------------------+----------+----  ----+     Vein mapping +-----------------+-------------+----------+--------+  Right Cephalic   Diameter (cm)Depth (cm)Findings  +-----------------+-------------+----------+--------+  Shoulder            0.43        0.89             +-----------------+-------------+----------+--------+  Prox upper arm       0.35        0.46             +-----------------+-------------+----------+--------+  Mid upper arm        0.35        0.20              +-----------------+-------------+----------+--------+  Dist upper arm       0.54        0.26             +-----------------+-------------+----------+--------+  Antecubital fossa    0.46        0.38             +-----------------+-------------+----------+--------+  Prox forearm         0.37        0.32             +-----------------+-------------+----------+--------+  Mid forearm          0.37        0.17             +-----------------+-------------+----------+--------+  Dist forearm         0.36        0.18             +-----------------+-------------+----------+--------+  Wrist               0.35        0.11             +-----------------+-------------+----------+--------+   +-----------------+-------------+----------+--------+  Right Basilic    Diameter (cm)Depth (cm)Findings  +-----------------+-------------+----------+--------+  Prox upper arm       0.71        1.00             +-----------------+-------------+----------+--------+  Mid upper arm        0.65        0.63             +-----------------+-------------+----------+--------+  Dist upper arm       0.32        0.43             +-----------------+-------------+----------+--------+  Antecubital fossa    0.41        0.42             +-----------------+-------------+----------+--------+  Prox forearm         0.37        0.14             +-----------------+-------------+----------+--------+  Mid forearm          0.30        0.17             +-----------------+-------------+----------+--------+  Distal forearm       0.30        0.18             +-----------------+-------------+----------+--------+  Wrist               0.30        0.16             +-----------------+-------------+----------+--------+   +-----------------+-------------+----------+--------+  Left Cephalic    Diameter (cm)Depth (cm)Findings   +-----------------+-------------+----------+--------+  Shoulder            0.32        0.51             +-----------------+-------------+----------+--------+  Prox upper arm       0.36        0.26             +-----------------+-------------+----------+--------+  Mid upper arm        0.40        0.30             +-----------------+-------------+----------+--------+  Dist upper arm       0.55        0.23             +-----------------+-------------+----------+--------+  Antecubital fossa    0.48        0.35             +-----------------+-------------+----------+--------+  Prox forearm         0.33        0.48             +-----------------+-------------+----------+--------+  Mid forearm          0.33        0.37             +-----------------+-------------+----------+--------+  Dist forearm         0.29        0.22             +-----------------+-------------+----------+--------+  Wrist               0.32        0.23             +-----------------+-------------+----------+--------+   +-----------------+-------------+----------+---------+  Left Basilic     Diameter (cm)Depth (cm)Findings   +-----------------+-------------+----------+---------+  Prox upper arm       0.74        1.10              +-----------------+-------------+----------+---------+  Mid upper arm        0.68        0.65              +-----------------+-------------+----------+---------+  Dist upper arm       0.37        0.52   branching  +-----------------+-------------+----------+---------+  Antecubital fossa    0.41        0.48              +-----------------+-------------+----------+---------+  Prox forearm         0.30        0.17              +-----------------+-------------+----------+---------+  Mid forearm          0.29        0.18              +-----------------+-------------+----------+---------+  Distal forearm       0.22         0.21   branching  +-----------------+-------------+----------+---------+  Wrist               0.23  0.27              +-----------------+-------------+----------+---------+   Note from nephrologist reviewed       Assessment/Plan:     David Cowan is a 63 y.o. male with CKD stage V who presents to discuss permanent access creation.  Dominant hand: left Previous access surgeries: none Previous catheters: none Other arm surgeries/injuries: none  The vein mapping suggests there is suitable superficial veins bilaterally and we discussed that they are a candidate for AV fistula  The risks an benefits including of access creation were reviewed including: need for additional procedures, need for additional creations, steal, ischemia monomelic neuropathy, failure of access, and bleeding. The patient expressed understand and is willing to proceed.  I explained that I will perform intraoperative vein mapping to confirm the above findings and will determine the most appropriate access to create but with a preoperative plan for right arm brachiocephalic fistula creation.  In regards to his PAD, I explained that I agree with Dr. [name]. He is a claudicant and is not limited about his pain, he does not warrant intervention at this time.      [name] MD [facility]

## 2023-08-19 ENCOUNTER — Ambulatory Visit (HOSPITAL_COMMUNITY)
Admission: RE | Admit: 2023-08-19 | Discharge: 2023-08-19 | Disposition: A | Discharge status: Home / Self Care | Payer: BC Managed Care – PPO | Source: Ambulatory Visit | Attending: Vascular Surgery | Admitting: Vascular Surgery

## 2023-08-19 ENCOUNTER — Ambulatory Visit: Payer: BC Managed Care – PPO | Admitting: Vascular Surgery

## 2023-08-19 ENCOUNTER — Other Ambulatory Visit: Payer: Self-pay

## 2023-08-19 ENCOUNTER — Encounter: Payer: Self-pay | Admitting: Vascular Surgery

## 2023-08-19 ENCOUNTER — Ambulatory Visit (INDEPENDENT_AMBULATORY_CARE_PROVIDER_SITE_OTHER)
Admission: RE | Admit: 2023-08-19 | Discharge: 2023-08-19 | Disposition: A | Discharge status: Home / Self Care | Payer: BC Managed Care – PPO | Source: Ambulatory Visit | Attending: Vascular Surgery | Admitting: Vascular Surgery

## 2023-08-19 VITALS — BP 145/84 | HR 65 | Temp 97.9°F | Resp 20 | Ht 70.0 in | Wt 242.0 lb

## 2023-08-19 DIAGNOSIS — I739 Peripheral vascular disease, unspecified: Secondary | ICD-10-CM | POA: Diagnosis not present

## 2023-08-19 DIAGNOSIS — N185 Chronic kidney disease, stage 5: Secondary | ICD-10-CM

## 2023-08-22 DIAGNOSIS — N185 Chronic kidney disease, stage 5: Secondary | ICD-10-CM | POA: Diagnosis not present

## 2023-08-26 ENCOUNTER — Encounter (HOSPITAL_COMMUNITY): Payer: Self-pay | Admitting: Vascular Surgery

## 2023-08-26 ENCOUNTER — Other Ambulatory Visit: Payer: Self-pay

## 2023-08-26 NOTE — Anesthesia Preprocedure Evaluation (Addendum)
 Anesthesia Evaluation  Patient identified by MRN, date of birth, ID band Patient awake    Reviewed: Allergy & Precautions, H&P , NPO status , Patient's Chart, lab work & pertinent test results  Airway Mallampati: III  TM Distance: >3 FB Neck ROM: Full    Dental no notable dental hx. (+) Teeth Intact, Dental Advisory Given   Pulmonary COPD   Pulmonary exam normal breath sounds clear to auscultation       Cardiovascular Exercise Tolerance: Good hypertension, Pt. on medications and Pt. on home beta blockers + CAD, + Past MI and + Peripheral Vascular Disease   Rhythm:Regular Rate:Normal     Neuro/Psych negative neurological ROS  negative psych ROS   GI/Hepatic negative GI ROS, Neg liver ROS,,,  Endo/Other  negative endocrine ROS    Renal/GU ESRF and DialysisRenal disease  negative genitourinary   Musculoskeletal  (+) Arthritis , Osteoarthritis,    Abdominal   Peds  Hematology negative hematology ROS (+)   Anesthesia Other Findings   Reproductive/Obstetrics negative OB ROS                             Anesthesia Physical Anesthesia Plan  ASA: 3  Anesthesia Plan: General   Post-op Pain Management: Tylenol PO (pre-op)*   Induction: Intravenous  PONV Risk Score and Plan: 3 and Ondansetron, Dexamethasone and Midazolam  Airway Management Planned: LMA  Additional Equipment:   Intra-op Plan:   Post-operative Plan: Extubation in OR  Informed Consent: I have reviewed the patients History and Physical, chart, labs and discussed the procedure including the risks, benefits and alternatives for the proposed anesthesia with the patient or authorized representative who has indicated his/her understanding and acceptance.     Dental advisory given  Plan Discussed with: CRNA  Anesthesia Plan Comments: (PAT note by Antionette Poles, PA-C: 63 year old male with pertinent history of CAD s/p remote  NSTEMI in 2000 managed medically, HLD, HTN, PAD, former smoker with associated mild COPD, CKD 5 not yet on HD.  He is followed by Dr. Kirke Corin for PAD, maintained on Pletal.  BMP 08/05/2023 notable for creatinine 5.47 consistent with CKD 5, potassium 4.8, sodium 140.  Patient will need day of surgery labs and evaluation.  EKG 08/05/2023: NSR.  Rate 86.   )        Anesthesia Quick Evaluation

## 2023-08-26 NOTE — Progress Notes (Signed)
 Anesthesia Chart Review: Same day workup  63 year old male with pertinent history of CAD s/p remote NSTEMI in 2000 managed medically, HLD, HTN, PAD, former smoker with associated mild COPD, CKD 5 not yet on HD.  He is followed by Dr. Kirke Corin for PAD, maintained on Pletal.  BMP 08/05/2023 notable for creatinine 5.47 consistent with CKD 5, potassium 4.8, sodium 140.  Patient will need day of surgery labs and evaluation.  EKG 08/05/2023: NSR.  Rate 86.    Zannie Cove [facility] Short Stay Center/Anesthesiology Phone [phone] 08/26/2023 10:27 AM

## 2023-08-26 NOTE — Progress Notes (Signed)
 PCP - Mordecai Maes, NP Cardiologist - Dr Lewayne Bunting Internal Med - Dr Bufford Buttner  CT Chest x-ray - 04/21/23 EKG - 08/05/23 Stress Test - n/a ECHO - n/a Cardiac Cath - 07/21/00  ICD Pacemaker/Loop - n/a  Sleep Study -  n/a  Diabetes - n/a  Blood Thinner Instructions:  n/a  Aspirin Instructions: Continue  Pletal - Hold 5 days prior procedure (SDW Call).  Last dose was on 08/26/23.  NPO   Anesthesia review: Yes  STOP now taking any Aspirin (unless otherwise instructed by your surgeon), Aleve, Naproxen, Ibuprofen, Motrin, Advil, Goody's, BC's, all herbal medications, fish oil, and all vitamins.   Coronavirus Screening Do you have any of the following symptoms:  Cough yes/no: No Fever (>100.69F)  yes/no: No Runny nose yes/no: No Sore throat yes/no: No Difficulty breathing/shortness of breath  yes/no: No  Have you traveled in the last 14 days and where? yes/no: No  Patient verbalized understanding of instructions that were given via phone.

## 2023-08-30 ENCOUNTER — Ambulatory Visit (HOSPITAL_COMMUNITY): Payer: Self-pay | Admitting: Physician Assistant

## 2023-08-30 ENCOUNTER — Ambulatory Visit (HOSPITAL_COMMUNITY)
Admission: RE | Admit: 2023-08-30 | Discharge: 2023-08-30 | Disposition: A | Discharge status: Home / Self Care | Attending: Vascular Surgery | Admitting: Vascular Surgery

## 2023-08-30 ENCOUNTER — Encounter (HOSPITAL_COMMUNITY): Payer: Self-pay | Admitting: Vascular Surgery

## 2023-08-30 ENCOUNTER — Telehealth: Payer: Self-pay | Admitting: Vascular Surgery

## 2023-08-30 ENCOUNTER — Other Ambulatory Visit: Payer: Self-pay

## 2023-08-30 ENCOUNTER — Encounter (HOSPITAL_COMMUNITY)
Admission: RE | Disposition: A | Discharge status: Home / Self Care | Payer: Self-pay | Source: Home / Self Care | Attending: Vascular Surgery

## 2023-08-30 DIAGNOSIS — I252 Old myocardial infarction: Secondary | ICD-10-CM | POA: Insufficient documentation

## 2023-08-30 DIAGNOSIS — Z7982 Long term (current) use of aspirin: Secondary | ICD-10-CM | POA: Diagnosis not present

## 2023-08-30 DIAGNOSIS — I2510 Atherosclerotic heart disease of native coronary artery without angina pectoris: Secondary | ICD-10-CM | POA: Diagnosis not present

## 2023-08-30 DIAGNOSIS — M1990 Unspecified osteoarthritis, unspecified site: Secondary | ICD-10-CM | POA: Diagnosis not present

## 2023-08-30 DIAGNOSIS — Z8249 Family history of ischemic heart disease and other diseases of the circulatory system: Secondary | ICD-10-CM | POA: Insufficient documentation

## 2023-08-30 DIAGNOSIS — I120 Hypertensive chronic kidney disease with stage 5 chronic kidney disease or end stage renal disease: Secondary | ICD-10-CM | POA: Insufficient documentation

## 2023-08-30 DIAGNOSIS — Z79899 Other long term (current) drug therapy: Secondary | ICD-10-CM | POA: Diagnosis not present

## 2023-08-30 DIAGNOSIS — N185 Chronic kidney disease, stage 5: Secondary | ICD-10-CM | POA: Diagnosis not present

## 2023-08-30 DIAGNOSIS — Z79624 Long term (current) use of inhibitors of nucleotide synthesis: Secondary | ICD-10-CM | POA: Diagnosis not present

## 2023-08-30 DIAGNOSIS — I739 Peripheral vascular disease, unspecified: Secondary | ICD-10-CM | POA: Diagnosis not present

## 2023-08-30 DIAGNOSIS — N186 End stage renal disease: Secondary | ICD-10-CM | POA: Diagnosis not present

## 2023-08-30 DIAGNOSIS — J449 Chronic obstructive pulmonary disease, unspecified: Secondary | ICD-10-CM | POA: Insufficient documentation

## 2023-08-30 DIAGNOSIS — Z7902 Long term (current) use of antithrombotics/antiplatelets: Secondary | ICD-10-CM | POA: Diagnosis not present

## 2023-08-30 HISTORY — DX: Chronic obstructive pulmonary disease, unspecified: J44.9

## 2023-08-30 HISTORY — DX: Chronic kidney disease, unspecified: N18.9

## 2023-08-30 HISTORY — DX: Personal history of other infectious and parasitic diseases: Z86.19

## 2023-08-30 HISTORY — PX: AV FISTULA PLACEMENT: SHX1204

## 2023-08-30 LAB — POCT I-STAT, CHEM 8
BUN: 77 mg/dL — ABNORMAL HIGH (ref 8–23)
Calcium, Ion: 0.95 mmol/L — ABNORMAL LOW (ref 1.15–1.40)
Chloride: 103 mmol/L (ref 98–111)
Creatinine, Ser: 4.9 mg/dL — ABNORMAL HIGH (ref 0.61–1.24)
Glucose, Bld: 87 mg/dL (ref 70–99)
HCT: 32 % — ABNORMAL LOW (ref 39.0–52.0)
Hemoglobin: 10.9 g/dL — ABNORMAL LOW (ref 13.0–17.0)
Potassium: 5.6 mmol/L — ABNORMAL HIGH (ref 3.5–5.1)
Sodium: 135 mmol/L (ref 135–145)
TCO2: 22 mmol/L (ref 22–32)

## 2023-08-30 SURGERY — ARTERIOVENOUS (AV) FISTULA CREATION
Anesthesia: General | Laterality: Right

## 2023-08-30 MED ORDER — HYDROMORPHONE HCL 1 MG/ML IJ SOLN
INTRAMUSCULAR | Status: AC
  Filled 2023-08-30: qty 1

## 2023-08-30 MED ORDER — HEPARIN SODIUM (PORCINE) 1000 UNIT/ML IJ SOLN
INTRAMUSCULAR | Status: DC | PRN
  Administered 2023-08-30: 3000 [IU] via INTRAVENOUS

## 2023-08-30 MED ORDER — PROPOFOL 10 MG/ML IV BOLUS
INTRAVENOUS | Status: AC
  Filled 2023-08-30: qty 20

## 2023-08-30 MED ORDER — HEPARIN 6000 UNIT IRRIGATION SOLUTION
Status: DC | PRN
  Administered 2023-08-30: 1

## 2023-08-30 MED ORDER — SODIUM CHLORIDE 0.9 % IV SOLN: INTRAVENOUS | Status: DC

## 2023-08-30 MED ORDER — HYDROMORPHONE HCL 1 MG/ML IJ SOLN
0.2500 mg | INTRAMUSCULAR | Status: DC | PRN
  Administered 2023-08-30: 0.5 mg via INTRAVENOUS

## 2023-08-30 MED ORDER — ONDANSETRON HCL 4 MG/2ML IJ SOLN
INTRAMUSCULAR | Status: DC | PRN
  Administered 2023-08-30: 4 mg via INTRAVENOUS

## 2023-08-30 MED ORDER — VANCOMYCIN HCL IN DEXTROSE 1-5 GM/200ML-% IV SOLN
1000.0000 mg | INTRAVENOUS | Status: AC
  Administered 2023-08-30: 1000 mg via INTRAVENOUS
  Filled 2023-08-30: qty 200

## 2023-08-30 MED ORDER — LIDOCAINE-EPINEPHRINE (PF) 1 %-1:200000 IJ SOLN
INTRAMUSCULAR | Status: AC
  Filled 2023-08-30: qty 30

## 2023-08-30 MED ORDER — DEXAMETHASONE SODIUM PHOSPHATE 10 MG/ML IJ SOLN
INTRAMUSCULAR | Status: DC | PRN
  Administered 2023-08-30: 8 mg via INTRAVENOUS

## 2023-08-30 MED ORDER — PHENYLEPHRINE 80 MCG/ML (10ML) SYRINGE FOR IV PUSH (FOR BLOOD PRESSURE SUPPORT)
PREFILLED_SYRINGE | INTRAVENOUS | Status: DC | PRN
  Administered 2023-08-30 (×5): 80 ug via INTRAVENOUS

## 2023-08-30 MED ORDER — CHLORHEXIDINE GLUCONATE 4 % EX SOLN: 60.0000 mL | Freq: Once | CUTANEOUS | Status: DC

## 2023-08-30 MED ORDER — LIDOCAINE 2% (20 MG/ML) 5 ML SYRINGE
INTRAMUSCULAR | Status: DC | PRN
  Administered 2023-08-30: 60 mg via INTRAVENOUS

## 2023-08-30 MED ORDER — MIDAZOLAM HCL 2 MG/2ML IJ SOLN
INTRAMUSCULAR | Status: AC
  Filled 2023-08-30: qty 2

## 2023-08-30 MED ORDER — PHENYLEPHRINE HCL-NACL 20-0.9 MG/250ML-% IV SOLN
INTRAVENOUS | Status: DC | PRN
Start: 2023-08-30 — End: 2023-08-30
  Administered 2023-08-30: 25 ug/min via INTRAVENOUS

## 2023-08-30 MED ORDER — 0.9 % SODIUM CHLORIDE (POUR BTL) OPTIME
TOPICAL | Status: DC | PRN
  Administered 2023-08-30: 1000 mL

## 2023-08-30 MED ORDER — HEMOSTATIC AGENTS (NO CHARGE) OPTIME
TOPICAL | Status: DC | PRN
  Administered 2023-08-30: 1 via TOPICAL

## 2023-08-30 MED ORDER — SODIUM CHLORIDE 0.9% FLUSH: 3.0000 mL | INTRAVENOUS | Status: DC | PRN

## 2023-08-30 MED ORDER — FENTANYL CITRATE (PF) 250 MCG/5ML IJ SOLN
INTRAMUSCULAR | Status: DC | PRN
  Administered 2023-08-30 (×2): 50 ug via INTRAVENOUS

## 2023-08-30 MED ORDER — FENTANYL CITRATE (PF) 250 MCG/5ML IJ SOLN
INTRAMUSCULAR | Status: AC
  Filled 2023-08-30: qty 5

## 2023-08-30 MED ORDER — ACETAMINOPHEN 500 MG PO TABS
1000.0000 mg | ORAL_TABLET | Freq: Once | ORAL | Status: AC
  Administered 2023-08-30: 1000 mg via ORAL
  Filled 2023-08-30: qty 2

## 2023-08-30 MED ORDER — TRAMADOL HCL 50 MG PO TABS: 50.0000 mg | ORAL_TABLET | Freq: Four times a day (QID) | ORAL | 0 refills | Status: DC | PRN

## 2023-08-30 MED ORDER — CHLORHEXIDINE GLUCONATE 0.12 % MT SOLN
15.0000 mL | Freq: Once | OROMUCOSAL | Status: AC
  Administered 2023-08-30: 15 mL via OROMUCOSAL
  Filled 2023-08-30: qty 15

## 2023-08-30 MED ORDER — ORAL CARE MOUTH RINSE: 15.0000 mL | Freq: Once | OROMUCOSAL | Status: AC

## 2023-08-30 MED ORDER — MIDAZOLAM HCL 2 MG/2ML IJ SOLN
INTRAMUSCULAR | Status: DC | PRN
  Administered 2023-08-30 (×2): 1 mg via INTRAVENOUS

## 2023-08-30 MED ORDER — PROPOFOL 10 MG/ML IV BOLUS
INTRAVENOUS | Status: DC | PRN
  Administered 2023-08-30: 150 mg via INTRAVENOUS

## 2023-08-30 SURGICAL SUPPLY — 28 items
ARMBAND PINK RESTRICT EXTREMIT (MISCELLANEOUS) ×1 IMPLANT
BAG COUNTER SPONGE SURGICOUNT (BAG) ×1 IMPLANT
CANISTER SUCT 3000ML PPV (MISCELLANEOUS) ×1 IMPLANT
CLIP TI MEDIUM 6 (CLIP) ×1 IMPLANT
CLIP TI WIDE RED SMALL 6 (CLIP) ×1 IMPLANT
COVER PROBE W GEL 5X96 (DRAPES) IMPLANT
DERMABOND ADVANCED .7 DNX12 (GAUZE/BANDAGES/DRESSINGS) ×1 IMPLANT
ELECT REM PT RETURN 9FT ADLT (ELECTROSURGICAL) ×1 IMPLANT
ELECTRODE REM PT RTRN 9FT ADLT (ELECTROSURGICAL) ×1 IMPLANT
GLOVE BIOGEL PI IND STRL 7.0 (GLOVE) ×1 IMPLANT
GOWN STRL REUS W/ TWL LRG LVL3 (GOWN DISPOSABLE) ×2 IMPLANT
GOWN STRL REUS W/ TWL XL LVL3 (GOWN DISPOSABLE) ×1 IMPLANT
HEMOSTAT SNOW SURGICEL 2X4 (HEMOSTASIS) IMPLANT
INSERT FOGARTY SM (MISCELLANEOUS) IMPLANT
KIT BASIN OR (CUSTOM PROCEDURE TRAY) ×1 IMPLANT
KIT TURNOVER KIT B (KITS) ×1 IMPLANT
NS IRRIG 1000ML POUR BTL (IV SOLUTION) ×1 IMPLANT
PACK CV ACCESS (CUSTOM PROCEDURE TRAY) ×1 IMPLANT
PAD ARMBOARD POSITIONER FOAM (MISCELLANEOUS) ×2 IMPLANT
POWDER SURGICEL 3.0 GRAM (HEMOSTASIS) IMPLANT
SLING ARM FOAM STRAP LRG (SOFTGOODS) IMPLANT
SUT MNCRL AB 4-0 PS2 18 (SUTURE) ×1 IMPLANT
SUT PROLENE 6 0 BV (SUTURE) ×1 IMPLANT
SUT VIC AB 3-0 SH 27X BRD (SUTURE) ×1 IMPLANT
TOWEL GREEN STERILE (TOWEL DISPOSABLE) ×1 IMPLANT
UNDERPAD 30X36 HEAVY ABSORB (UNDERPADS AND DIAPERS) ×1 IMPLANT
VASCULAR TIE MINI RED 18IN STL (MISCELLANEOUS) IMPLANT
WATER STERILE IRR 1000ML POUR (IV SOLUTION) ×1 IMPLANT

## 2023-08-30 NOTE — Discharge Instructions (Signed)
 [facility]  Discharge Instructions  AV Fistula or Graft Surgery for Dialysis Access  Please refer to the following instructions for your post-procedure care. Your surgeon or physician assistant will discuss any changes with you.  Activity  You may drive the day following your surgery, if you are comfortable and no longer taking prescription pain medication. Resume full activity as the soreness in your incision resolves.  Bathing/Showering  You may shower after you go home. Keep your incision dry for 48 hours. Do not soak in a bathtub, hot tub, or swim until the incision heals completely. You may not shower if you have a hemodialysis catheter.  Incision Care  Clean your incision with mild soap and water after 48 hours. Pat the area dry with a clean towel. You do not need a bandage unless otherwise instructed. Do not apply any ointments or creams to your incision. You may have skin glue on your incision. Do not peel it off. It will come off on its own in about one week. Your arm may swell a bit after surgery. To reduce swelling use pillows to elevate your arm so it is above your heart. Your doctor will tell you if you need to lightly wrap your arm with an ACE bandage.  Diet  Resume your normal diet. There are not special food restrictions following this procedure. In order to heal from your surgery, it is CRITICAL to get adequate nutrition. Your body requires vitamins, minerals, and protein. Vegetables are the best source of vitamins and minerals. Vegetables also provide the perfect balance of protein. Processed food has little nutritional value, so try to avoid this.  Medications  Resume taking all of your medications. If your incision is causing pain, you may take over-the counter pain relievers such as acetaminophen (Tylenol). If you were prescribed a stronger pain medication, please be aware these medications can cause nausea and constipation. Prevent  nausea by taking the medication with a snack or meal. Avoid constipation by drinking plenty of fluids and eating foods with high amount of fiber, such as fruits, vegetables, and grains.  Do not take Tylenol if you are taking prescription pain medications.  Follow up Your surgeon may want to see you in the office following your access surgery. If so, this will be arranged at the time of your surgery.  Please call us immediately for any of the following conditions:  Increased pain, redness, drainage (pus) from your incision site Fever of 101 degrees or higher Severe or worsening pain at your incision site Hand pain or numbness.  Reduce your risk of vascular disease:  Stop smoking. If you would like help, call [facility] at 1-800-QUIT-NOW (619-193-8140) or [place] at [phone]  Manage your cholesterol Maintain a desired weight Control your diabetes Keep your blood pressure down  Dialysis  It will take several weeks to several months for your new dialysis access to be ready for use. Your surgeon will determine when it is okay to use it. Your nephrologist will continue to direct your dialysis. You can continue to use your Permcath until your new access is ready for use.   08/30/2023 David Cowan [id number] [birth year]  Surgeon(s): [name], MD  Procedure(s): ARTERIOVENOUS (AV) FISTULA CREATION   May stick graft immediately   May stick graft on designated area only:   X Do not stick right AV fistula for 12 weeks    If you have any questions, please call the office at [phone].

## 2023-08-30 NOTE — Anesthesia Procedure Notes (Signed)
 Procedure Name: LMA Insertion Date/Time: 08/30/2023 7:36 AM  Performed by: Yolonda Kida, CRNAPre-anesthesia Checklist: Patient identified, Emergency Drugs available, Suction available and Patient being monitored Patient Re-evaluated:Patient Re-evaluated prior to induction Oxygen Delivery Method: Circle System Utilized Preoxygenation: Pre-oxygenation with 100% oxygen Induction Type: IV induction Ventilation: Mask ventilation without difficulty LMA: LMA inserted LMA Size: 4.0 Number of attempts: 1 Placement Confirmation: positive ETCO2 Tube secured with: Tape Dental Injury: Teeth and Oropharynx as per pre-operative assessment

## 2023-08-30 NOTE — Op Note (Signed)
    OPERATIVE NOTE  PROCEDURE:   Intraoperative right arm vein mapping right arm brachiocephalic AVF creation  PRE-OPERATIVE DIAGNOSIS: CKD stage V  POST-OPERATIVE DIAGNOSIS: same as above   SURGEON: [name] MD  ASSISTANT(S): Nathanial Rancher, PA  Given the complexity of the case,  the assistant was necessary in order to expedient the procedure and safely perform the technical aspects of the operation.  The assistant provided traction and countertraction to assist with exposure of the artery and vein.  They also assisted with suture ligation of multiple venous branches.  They played a critical role in the anastomosis. These skills, especially following the Prolene suture for the anastomosis, could not have been adequately performed by a scrub tech assistant.   ANESTHESIA: general  ESTIMATED BLOOD LOSS: 10 cc  FINDING(S): Sufficiently sized right arm cephalic vein throughout its course Sufficiently sized right brachial artery Palpable and doppler thrill in AVF with multiphasic radial artery on completion  SPECIMEN(S):  none  INDICATIONS:   David Cowan is a 63 y.o. male with CKD stage V. The patient is not currently on dialysis. They were seen in the office for evaluation of hemodialysis access. The risks an benefits including of access creation were reviewed including: need for additional procedures, need for additional creations, steal, ischemia monomelic neuropathy, failure of access, and bleeding. The patient expressed understand and is willing to proceed.    DESCRIPTION: The patient was brought to the operating room positioned supine on the operating table.  The right arm was prepped and draped in usual sterile fashion.  Timeout was performed and preoperative antibiotics were administered.  The case began with ultrasound mapping of the brachial artery and cephalic vein, which demonstrated sufficient size at the antecubital fossa for arteriovenous fistula.  A transverse  incision was made 1 finger breadth below the elbow creese in the antecubital fossa. The  cephalic vein was isolated for 3 cm in length. Next the aponeurosis was partially released and the brachial artery secured with a vessel loop. The patient was heparinized. The cephalic vein was transected and ligated distally with a 2-0 silk and vascular clip. The vein was dilated and flushed with heparin saline. Vascular clamps were placed proximally and distally on the brachial artery and an approximate 6 mm arteriotomy was created on the brachial artery. This was flushed with heparin saline.  An anastomosis was created in end to side fashion on the brachial artery using running 6-0 Prolene suture. Prior to completing the anastomosis, the vessels were flushed and the suture line was tied down. There was an excellent thrill in the cephalic vein from the anastomosis to the mid upper arm. The patient had a multiphasic radial signal and had an excellent doppler signal in the fistula. The incision was irrigated and hemostasis achieved with cautery and suture. The deeper tissue was closed with 3-0 Vicryl and the skin closed with 4-0 Monocryl. Dermabond was applied to the incisions. The patient was transferred to PACU in stable condition.  COMPLICATIONS: none apparent  CONDITION: good  [name] MD [facility] Phone Number: [phone] 08/30/2023 8:42 AM

## 2023-08-30 NOTE — Anesthesia Postprocedure Evaluation (Signed)
 Anesthesia Post Note  Patient: GERROD MAULE  Procedure(s) Performed: ARTERIOVENOUS (AV) FISTULA CREATION (Right)     Patient location during evaluation: PACU Anesthesia Type: General Level of consciousness: awake and alert Pain management: pain level controlled Vital Signs Assessment: post-procedure vital signs reviewed and stable Respiratory status: spontaneous breathing, nonlabored ventilation and respiratory function stable Cardiovascular status: blood pressure returned to baseline and stable Postop Assessment: no apparent nausea or vomiting Anesthetic complications: no  No notable events documented.  Last Vitals:  Vitals:   08/30/23 0900 08/30/23 0915  BP: (!) 150/77 134/76  Pulse: (!) 59 (!) 58  Resp: 15 13  Temp:  36.6 C  SpO2: 94% 95%    Last Pain:  Vitals:   08/30/23 0915  TempSrc:   PainSc: 2                  Daiveon Markman,W. EDMOND

## 2023-08-30 NOTE — Transfer of Care (Signed)
 Immediate Anesthesia Transfer of Care Note  Patient: David Cowan  Procedure(s) Performed: ARTERIOVENOUS (AV) FISTULA CREATION (Right)  Patient Location: PACU  Anesthesia Type:General  Level of Consciousness: awake, alert , oriented, and patient cooperative  Airway & Oxygen Therapy: Patient Spontanous Breathing  Post-op Assessment: Report given to RN and Post -op Vital signs reviewed and stable  Post vital signs: Reviewed and stable  Last Vitals:  Vitals Value Taken Time  BP 145/78 08/30/23 0852  Temp 36.6 C 08/30/23 0852  Pulse 59 08/30/23 0855  Resp 15 08/30/23 0855  SpO2 100 % 08/30/23 0855  Vitals shown include unfiled device data.  Last Pain:  Vitals:   08/30/23 0622  TempSrc:   PainSc: 0-No pain         Complications: No notable events documented.

## 2023-08-30 NOTE — Interval H&P Note (Signed)
 History and Physical Interval Note:  08/30/2023 7:23 AM  David Cowan  has presented today for surgery, with the diagnosis of ESRD.  The various methods of treatment have been discussed with the patient and family. After consideration of risks, benefits and other options for treatment, the patient has consented to  Procedure(s): ARTERIOVENOUS (AV) FISTULA CREATION (Right) as a surgical intervention.  The patient's history has been reviewed, patient examined, no change in status, stable for surgery.  I have reviewed the patient's chart and labs.  Questions were answered to the patient's satisfaction.     [name]

## 2023-08-31 ENCOUNTER — Encounter (HOSPITAL_COMMUNITY): Payer: Self-pay | Admitting: Vascular Surgery

## 2023-09-01 ENCOUNTER — Telehealth: Payer: Self-pay

## 2023-09-01 NOTE — Telephone Encounter (Signed)
 Advice: -received message from pt's wife stating he is having some bruising and swelling in the arm which she believes is normal.  She states he has had some trouble swallowing, feeling like there is a gas bubble. -returned call to pt who states he felt like there was air in his chest but it has eased off, still there but better.   He states is arm is ok, the bruising has gone down a little further but he expected that. -advised to try warm liquids like coffee, tea, or soups to help relax esophagus and ease discomfort.  Encouraged elevating arm above the heart to keep swelling at a minimum. -pt confirms understanding and will call if he has further concerns

## 2023-09-02 DIAGNOSIS — E785 Hyperlipidemia, unspecified: Secondary | ICD-10-CM | POA: Diagnosis not present

## 2023-09-02 DIAGNOSIS — N185 Chronic kidney disease, stage 5: Secondary | ICD-10-CM | POA: Diagnosis not present

## 2023-09-02 DIAGNOSIS — I10 Essential (primary) hypertension: Secondary | ICD-10-CM | POA: Diagnosis not present

## 2023-09-02 DIAGNOSIS — N051 Unspecified nephritic syndrome with focal and segmental glomerular lesions: Secondary | ICD-10-CM | POA: Diagnosis not present

## 2023-09-08 NOTE — Telephone Encounter (Signed)
 Appt has been scheduled.

## 2023-09-12 ENCOUNTER — Telehealth: Payer: Self-pay | Admitting: *Deleted

## 2023-09-12 NOTE — Telephone Encounter (Signed)
 Marland Kitchen

## 2023-09-16 ENCOUNTER — Telehealth: Payer: Self-pay | Admitting: Nurse Practitioner

## 2023-09-16 ENCOUNTER — Encounter: Payer: Self-pay | Admitting: Nurse Practitioner

## 2023-09-16 DIAGNOSIS — Z1211 Encounter for screening for malignant neoplasm of colon: Secondary | ICD-10-CM

## 2023-09-16 NOTE — Telephone Encounter (Signed)
 Mychart message sent.

## 2023-09-16 NOTE — Telephone Encounter (Signed)
 Copied from CRM [id number]. Topic: General - Other >> Sep 15, 2023  2:45 PM David Cowan wrote: Reason for EAV:[id number] called stating last Tuesday he had a fistula in his arm just in case he had to do dialysis. Patient stated he need to have  a flu, hep, shingles, pneumonia and colonoscopy done before he can get a kidney transplant. Patient want to be sent a message on mychart regarding this

## 2023-09-27 DIAGNOSIS — N185 Chronic kidney disease, stage 5: Secondary | ICD-10-CM | POA: Diagnosis not present

## 2023-10-14 DIAGNOSIS — I770 Arteriovenous fistula, acquired: Secondary | ICD-10-CM | POA: Diagnosis not present

## 2023-10-14 DIAGNOSIS — I10 Essential (primary) hypertension: Secondary | ICD-10-CM | POA: Diagnosis not present

## 2023-10-14 DIAGNOSIS — N051 Unspecified nephritic syndrome with focal and segmental glomerular lesions: Secondary | ICD-10-CM | POA: Diagnosis not present

## 2023-10-14 DIAGNOSIS — N185 Chronic kidney disease, stage 5: Secondary | ICD-10-CM | POA: Diagnosis not present

## 2023-10-14 NOTE — Telephone Encounter (Signed)
 Called pt to verify if he was now getting the Hep B vaccine instead of the Hep Titer that was discussed in the MyChart message. Pt stated he did not tell the agent he needed a shot. I apologized and said that is why I called him. I made him a lab appt 11-02-23 at 830 and he has a NV at 845 for the vaccines.   Please put in future orders for Hep B Titer. Thank you

## 2023-10-14 NOTE — Telephone Encounter (Signed)
 Copied from CRM [id number]. Topic: Appointments - Scheduling Inquiry for Clinic >> Oct 14, 2023  3:19 PM Dyann Glaser G wrote: Reason for CRM: PT IS WANTING TO SCHEDULE AN APPT FOR HEP B SHOT. STATED HE WOULD LIKE TO HAVE IT DONE ON 05/21 @845  WITH THE OTHER SHOTS.

## 2023-10-19 ENCOUNTER — Other Ambulatory Visit: Payer: Self-pay

## 2023-10-19 DIAGNOSIS — N185 Chronic kidney disease, stage 5: Secondary | ICD-10-CM

## 2023-10-24 ENCOUNTER — Other Ambulatory Visit: Payer: Self-pay | Admitting: Nurse Practitioner

## 2023-10-24 DIAGNOSIS — N185 Chronic kidney disease, stage 5: Secondary | ICD-10-CM

## 2023-10-28 ENCOUNTER — Ambulatory Visit (HOSPITAL_COMMUNITY)
Admission: RE | Admit: 2023-10-28 | Discharge: 2023-10-28 | Disposition: A | Discharge status: Home / Self Care | Source: Ambulatory Visit | Attending: Vascular Surgery | Admitting: Vascular Surgery

## 2023-10-28 ENCOUNTER — Ambulatory Visit: Attending: Vascular Surgery | Admitting: Physician Assistant

## 2023-10-28 DIAGNOSIS — N185 Chronic kidney disease, stage 5: Secondary | ICD-10-CM | POA: Insufficient documentation

## 2023-10-28 NOTE — Progress Notes (Signed)
    Postoperative Access Visit   History of Present Illness   David Cowan is a 63 y.o. year old male who presents for postoperative follow-up for: R brachiocephalic arteriovenous fistula (Date: 3/18).  The patient's wounds are healed.  The patient denies steal symptoms.  The patient is able to complete their activities of daily living.  He is getting ready to start HD.  CKD managed by Dr. Christianne Cowper   Physical Examination  There were no vitals filed for this visit. There is no height or weight on file to calculate BMI.  right arm Incision is healed, palpable radial pulse, hand grip is 5/5, sensation in digits is intact,palpable thrill, bruit can be auscultated     Medical Decision Making   David Cowan is a 63 y.o. year old male who presents s/p right brachiocephalic arteriovenous fistula  Patent BC fistula without signs or symptoms of steal syndrome Ideally, the fistula would mature for the full 12 weeks postoperatively however if dialysis is needed it would be okay to cannulate fistula.  No need to place temporary or [facility] The patient may follow up on a prn basis   Cordie Deters PA-C [facility] Office: [phone]  Clinic MD: Susi Eric

## 2023-10-31 DIAGNOSIS — Z992 Dependence on renal dialysis: Secondary | ICD-10-CM | POA: Diagnosis not present

## 2023-10-31 DIAGNOSIS — N186 End stage renal disease: Secondary | ICD-10-CM | POA: Diagnosis not present

## 2023-11-01 DIAGNOSIS — N186 End stage renal disease: Secondary | ICD-10-CM | POA: Diagnosis not present

## 2023-11-01 DIAGNOSIS — Z992 Dependence on renal dialysis: Secondary | ICD-10-CM | POA: Diagnosis not present

## 2023-11-02 ENCOUNTER — Ambulatory Visit

## 2023-11-02 ENCOUNTER — Other Ambulatory Visit

## 2023-11-03 DIAGNOSIS — Z992 Dependence on renal dialysis: Secondary | ICD-10-CM | POA: Diagnosis not present

## 2023-11-03 DIAGNOSIS — N186 End stage renal disease: Secondary | ICD-10-CM | POA: Diagnosis not present

## 2023-11-04 DIAGNOSIS — N186 End stage renal disease: Secondary | ICD-10-CM | POA: Diagnosis not present

## 2023-11-04 DIAGNOSIS — Z992 Dependence on renal dialysis: Secondary | ICD-10-CM | POA: Diagnosis not present

## 2023-11-07 DIAGNOSIS — Z992 Dependence on renal dialysis: Secondary | ICD-10-CM | POA: Diagnosis not present

## 2023-11-07 DIAGNOSIS — N186 End stage renal disease: Secondary | ICD-10-CM | POA: Diagnosis not present

## 2023-11-08 DIAGNOSIS — N186 End stage renal disease: Secondary | ICD-10-CM | POA: Diagnosis not present

## 2023-11-08 DIAGNOSIS — Z992 Dependence on renal dialysis: Secondary | ICD-10-CM | POA: Diagnosis not present

## 2023-11-10 DIAGNOSIS — Z992 Dependence on renal dialysis: Secondary | ICD-10-CM | POA: Diagnosis not present

## 2023-11-10 DIAGNOSIS — N186 End stage renal disease: Secondary | ICD-10-CM | POA: Diagnosis not present

## 2023-11-11 ENCOUNTER — Encounter: Payer: Self-pay | Admitting: Nurse Practitioner

## 2023-11-11 DIAGNOSIS — N186 End stage renal disease: Secondary | ICD-10-CM | POA: Diagnosis not present

## 2023-11-11 DIAGNOSIS — Z992 Dependence on renal dialysis: Secondary | ICD-10-CM | POA: Diagnosis not present

## 2023-11-12 DIAGNOSIS — Z992 Dependence on renal dialysis: Secondary | ICD-10-CM | POA: Diagnosis not present

## 2023-11-12 DIAGNOSIS — I120 Hypertensive chronic kidney disease with stage 5 chronic kidney disease or end stage renal disease: Secondary | ICD-10-CM | POA: Diagnosis not present

## 2023-11-12 DIAGNOSIS — N186 End stage renal disease: Secondary | ICD-10-CM | POA: Diagnosis not present

## 2023-11-14 DIAGNOSIS — Z5181 Encounter for therapeutic drug level monitoring: Secondary | ICD-10-CM | POA: Diagnosis not present

## 2023-11-14 DIAGNOSIS — I7090 Unspecified atherosclerosis: Secondary | ICD-10-CM | POA: Diagnosis not present

## 2023-11-14 DIAGNOSIS — N184 Chronic kidney disease, stage 4 (severe): Secondary | ICD-10-CM | POA: Diagnosis not present

## 2023-11-14 DIAGNOSIS — R93429 Abnormal radiologic findings on diagnostic imaging of unspecified kidney: Secondary | ICD-10-CM | POA: Diagnosis not present

## 2023-11-14 DIAGNOSIS — Z01818 Encounter for other preprocedural examination: Secondary | ICD-10-CM | POA: Diagnosis not present

## 2023-11-15 DIAGNOSIS — N186 End stage renal disease: Secondary | ICD-10-CM | POA: Diagnosis not present

## 2023-11-15 DIAGNOSIS — Z992 Dependence on renal dialysis: Secondary | ICD-10-CM | POA: Diagnosis not present

## 2023-11-17 DIAGNOSIS — N186 End stage renal disease: Secondary | ICD-10-CM | POA: Diagnosis not present

## 2023-11-17 DIAGNOSIS — Z992 Dependence on renal dialysis: Secondary | ICD-10-CM | POA: Diagnosis not present

## 2023-11-18 DIAGNOSIS — N186 End stage renal disease: Secondary | ICD-10-CM | POA: Diagnosis not present

## 2023-11-18 DIAGNOSIS — Z992 Dependence on renal dialysis: Secondary | ICD-10-CM | POA: Diagnosis not present

## 2023-11-21 DIAGNOSIS — N2581 Secondary hyperparathyroidism of renal origin: Secondary | ICD-10-CM | POA: Diagnosis not present

## 2023-11-21 DIAGNOSIS — Z992 Dependence on renal dialysis: Secondary | ICD-10-CM | POA: Diagnosis not present

## 2023-11-21 DIAGNOSIS — N186 End stage renal disease: Secondary | ICD-10-CM | POA: Diagnosis not present

## 2023-11-22 DIAGNOSIS — Z01818 Encounter for other preprocedural examination: Secondary | ICD-10-CM | POA: Diagnosis not present

## 2023-11-23 DIAGNOSIS — Z992 Dependence on renal dialysis: Secondary | ICD-10-CM | POA: Diagnosis not present

## 2023-11-23 DIAGNOSIS — N2581 Secondary hyperparathyroidism of renal origin: Secondary | ICD-10-CM | POA: Diagnosis not present

## 2023-11-23 DIAGNOSIS — N186 End stage renal disease: Secondary | ICD-10-CM | POA: Diagnosis not present

## 2023-11-25 DIAGNOSIS — N2581 Secondary hyperparathyroidism of renal origin: Secondary | ICD-10-CM | POA: Diagnosis not present

## 2023-11-25 DIAGNOSIS — N186 End stage renal disease: Secondary | ICD-10-CM | POA: Diagnosis not present

## 2023-11-25 DIAGNOSIS — Z992 Dependence on renal dialysis: Secondary | ICD-10-CM | POA: Diagnosis not present

## 2023-11-28 ENCOUNTER — Encounter: Payer: BC Managed Care – PPO | Admitting: Nurse Practitioner

## 2023-11-28 DIAGNOSIS — N186 End stage renal disease: Secondary | ICD-10-CM | POA: Diagnosis not present

## 2023-11-28 DIAGNOSIS — Z992 Dependence on renal dialysis: Secondary | ICD-10-CM | POA: Diagnosis not present

## 2023-11-28 DIAGNOSIS — N2581 Secondary hyperparathyroidism of renal origin: Secondary | ICD-10-CM | POA: Diagnosis not present

## 2023-11-30 DIAGNOSIS — N2581 Secondary hyperparathyroidism of renal origin: Secondary | ICD-10-CM | POA: Diagnosis not present

## 2023-11-30 DIAGNOSIS — Z992 Dependence on renal dialysis: Secondary | ICD-10-CM | POA: Diagnosis not present

## 2023-11-30 DIAGNOSIS — N186 End stage renal disease: Secondary | ICD-10-CM | POA: Diagnosis not present

## 2023-12-02 DIAGNOSIS — N186 End stage renal disease: Secondary | ICD-10-CM | POA: Diagnosis not present

## 2023-12-02 DIAGNOSIS — Z992 Dependence on renal dialysis: Secondary | ICD-10-CM | POA: Diagnosis not present

## 2023-12-02 DIAGNOSIS — N2581 Secondary hyperparathyroidism of renal origin: Secondary | ICD-10-CM | POA: Diagnosis not present

## 2023-12-05 DIAGNOSIS — Z992 Dependence on renal dialysis: Secondary | ICD-10-CM | POA: Diagnosis not present

## 2023-12-05 DIAGNOSIS — N2581 Secondary hyperparathyroidism of renal origin: Secondary | ICD-10-CM | POA: Diagnosis not present

## 2023-12-05 DIAGNOSIS — N186 End stage renal disease: Secondary | ICD-10-CM | POA: Diagnosis not present

## 2023-12-07 DIAGNOSIS — N186 End stage renal disease: Secondary | ICD-10-CM | POA: Diagnosis not present

## 2023-12-07 DIAGNOSIS — N2581 Secondary hyperparathyroidism of renal origin: Secondary | ICD-10-CM | POA: Diagnosis not present

## 2023-12-07 DIAGNOSIS — Z992 Dependence on renal dialysis: Secondary | ICD-10-CM | POA: Diagnosis not present

## 2023-12-09 DIAGNOSIS — N2581 Secondary hyperparathyroidism of renal origin: Secondary | ICD-10-CM | POA: Diagnosis not present

## 2023-12-09 DIAGNOSIS — N186 End stage renal disease: Secondary | ICD-10-CM | POA: Diagnosis not present

## 2023-12-09 DIAGNOSIS — Z992 Dependence on renal dialysis: Secondary | ICD-10-CM | POA: Diagnosis not present

## 2023-12-12 DIAGNOSIS — Z992 Dependence on renal dialysis: Secondary | ICD-10-CM | POA: Diagnosis not present

## 2023-12-12 DIAGNOSIS — N186 End stage renal disease: Secondary | ICD-10-CM | POA: Diagnosis not present

## 2023-12-12 DIAGNOSIS — I120 Hypertensive chronic kidney disease with stage 5 chronic kidney disease or end stage renal disease: Secondary | ICD-10-CM | POA: Diagnosis not present

## 2023-12-12 DIAGNOSIS — N2581 Secondary hyperparathyroidism of renal origin: Secondary | ICD-10-CM | POA: Diagnosis not present

## 2023-12-14 DIAGNOSIS — Z992 Dependence on renal dialysis: Secondary | ICD-10-CM | POA: Diagnosis not present

## 2023-12-14 DIAGNOSIS — N186 End stage renal disease: Secondary | ICD-10-CM | POA: Diagnosis not present

## 2023-12-14 DIAGNOSIS — N2581 Secondary hyperparathyroidism of renal origin: Secondary | ICD-10-CM | POA: Diagnosis not present

## 2023-12-16 DIAGNOSIS — N2581 Secondary hyperparathyroidism of renal origin: Secondary | ICD-10-CM | POA: Diagnosis not present

## 2023-12-16 DIAGNOSIS — N186 End stage renal disease: Secondary | ICD-10-CM | POA: Diagnosis not present

## 2023-12-16 DIAGNOSIS — Z992 Dependence on renal dialysis: Secondary | ICD-10-CM | POA: Diagnosis not present

## 2023-12-19 DIAGNOSIS — Z992 Dependence on renal dialysis: Secondary | ICD-10-CM | POA: Diagnosis not present

## 2023-12-19 DIAGNOSIS — N186 End stage renal disease: Secondary | ICD-10-CM | POA: Diagnosis not present

## 2023-12-19 DIAGNOSIS — N2581 Secondary hyperparathyroidism of renal origin: Secondary | ICD-10-CM | POA: Diagnosis not present

## 2023-12-21 DIAGNOSIS — N2581 Secondary hyperparathyroidism of renal origin: Secondary | ICD-10-CM | POA: Diagnosis not present

## 2023-12-21 DIAGNOSIS — Z992 Dependence on renal dialysis: Secondary | ICD-10-CM | POA: Diagnosis not present

## 2023-12-21 DIAGNOSIS — N186 End stage renal disease: Secondary | ICD-10-CM | POA: Diagnosis not present

## 2023-12-23 DIAGNOSIS — Z992 Dependence on renal dialysis: Secondary | ICD-10-CM | POA: Diagnosis not present

## 2023-12-23 DIAGNOSIS — N186 End stage renal disease: Secondary | ICD-10-CM | POA: Diagnosis not present

## 2023-12-23 DIAGNOSIS — N2581 Secondary hyperparathyroidism of renal origin: Secondary | ICD-10-CM | POA: Diagnosis not present

## 2023-12-26 DIAGNOSIS — Z23 Encounter for immunization: Secondary | ICD-10-CM | POA: Diagnosis not present

## 2023-12-26 DIAGNOSIS — Z992 Dependence on renal dialysis: Secondary | ICD-10-CM | POA: Diagnosis not present

## 2023-12-26 DIAGNOSIS — N186 End stage renal disease: Secondary | ICD-10-CM | POA: Diagnosis not present

## 2023-12-26 DIAGNOSIS — N2581 Secondary hyperparathyroidism of renal origin: Secondary | ICD-10-CM | POA: Diagnosis not present

## 2023-12-28 DIAGNOSIS — Z23 Encounter for immunization: Secondary | ICD-10-CM | POA: Diagnosis not present

## 2023-12-28 DIAGNOSIS — Z992 Dependence on renal dialysis: Secondary | ICD-10-CM | POA: Diagnosis not present

## 2023-12-28 DIAGNOSIS — N2581 Secondary hyperparathyroidism of renal origin: Secondary | ICD-10-CM | POA: Diagnosis not present

## 2023-12-28 DIAGNOSIS — N186 End stage renal disease: Secondary | ICD-10-CM | POA: Diagnosis not present

## 2023-12-30 DIAGNOSIS — Z23 Encounter for immunization: Secondary | ICD-10-CM | POA: Diagnosis not present

## 2023-12-30 DIAGNOSIS — N186 End stage renal disease: Secondary | ICD-10-CM | POA: Diagnosis not present

## 2023-12-30 DIAGNOSIS — Z992 Dependence on renal dialysis: Secondary | ICD-10-CM | POA: Diagnosis not present

## 2023-12-30 DIAGNOSIS — N2581 Secondary hyperparathyroidism of renal origin: Secondary | ICD-10-CM | POA: Diagnosis not present

## 2024-01-01 NOTE — Progress Notes (Unsigned)
 Ellouise Console, PA-C [address] [place], [place]  [address] Phone: [phone]   Gastroenterology Consultation  Referring Provider:     Wendee Lynwood HERO, NP Primary Care Physician:  Wendee Lynwood HERO, NP Primary Gastroenterologist:  Ellouise Console, PA-C / Glendia Holt, MD  Reason for Consultation:     Discuss Colonoscopy        HPI:   David Cowan is a 63 y.o. y/o male referred for consultation & management  by Wendee Lynwood HERO, NP.  He is here to discuss scheduling a colonoscopy.  He is undergoing evaluation through [facility] for kidney transplant and needs updated colonoscopy.  Currently on Dialysis M/W/F.  Current symptoms: Patient denies any GI symptoms.  Specifically he denies abdominal pain, heartburn, dysphagia, bowel irregularities, or rectal bleeding.  No Family history of colon cancer.  05/2014 last screening colonoscopy by Dr. Aneita: 2 small (5 mm, 6 mm) colon mucosal polyps removed from sigmoid colon, grade 1 internal hemorrhoids.  10-year repeat was recommended.  PMH: Stage 5 CKD,  CAD, HTN, history of MI, PVD, COPD.  Right arm fistula placed 08/30/2023 for dialysis.  Currently on Pletal  and aspirin .  11/14/2023 lab: BUN 61, creatinine 4.58, GFR 14, potassium 4.5.  Normal LFTs.  Hemoglobin 10.0, hematocrit 31, MCV 94, platelet 194.  Normal total iron 76, iron saturation 18.  Past Medical History:  Diagnosis Date   Arthritis    Bronchitis    CAD (coronary artery disease)    Chronic kidney disease    level 5   COPD (chronic obstructive pulmonary disease) (HCC)    mild   Dyslipidemia    Hand pain    occasional in right hand   Hard of hearing    left ear, no hearing aid   History of shingles    Hyperlipidemia    Hypertension    Myocardial infarction [facility]) 2002   Right flank pain     Past Surgical History:  Procedure Laterality Date   AV FISTULA PLACEMENT Right 08/30/2023   Procedure: ARTERIOVENOUS (AV) FISTULA CREATION;  Surgeon: Pearline Norman RAMAN,  MD;  Location: [facility] OR;  Service: Vascular;  Laterality: Right;   CARDIAC CATHETERIZATION  07/21/2000   COLONOSCOPY     RENAL BIOPSY     TOTAL HIP ARTHROPLASTY Left 12/10/2014   Procedure: LEFT TOTAL HIP ARTHROPLASTY ANTERIOR APPROACH;  Surgeon: Donnice Car, MD;  Location: [facility] ORS;  Service: Orthopedics;  Laterality: Left;   VASECTOMY      Prior to Admission medications   Medication Sig Start Date End Date Taking? Authorizing Provider  albuterol  (VENTOLIN  HFA) 108 (90 Base) MCG/ACT inhaler Inhale 2 puffs into the lungs every 6 (six) hours as needed for wheezing or shortness of breath. 03/11/22   Wendee Lynwood HERO, NP  aspirin  81 MG tablet Take 1 tablet (81 mg total) by mouth daily. 03/21/18   Darron Deatrice LABOR, MD  cetirizine (ZYRTEC) 10 MG tablet Take 10 mg by mouth daily as needed for allergies.    [provider]  cilostazol  (PLETAL ) 100 MG tablet TAKE 1 TABLET BY MOUTH TWICE A DAY 02/07/23   Darron Deatrice LABOR, MD  clindamycin (CLEOCIN) 300 MG capsule Take 300 mg by mouth 4 (four) times daily. For 1 week Patient not taking: Reported on 10/28/2023    [provider]  Evolocumab  (REPATHA  SURECLICK) 140 MG/ML SOAJ INJECT 140 MG INTO THE SKIN EVERY 14 (FOURTEEN) DAYS. 07/19/23   Darron Deatrice LABOR, MD  fenofibrate   160 MG tablet TAKE 1 TABLET BY MOUTH EVERY DAY Patient taking differently: Take 160 mg by mouth at bedtime. 02/25/23   Waddell Danelle ORN, MD  fluticasone  (FLONASE ) 50 MCG/ACT nasal spray Place 2 sprays into both nostrils at bedtime. Patient taking differently: Place 2 sprays into both nostrils at bedtime as needed for allergies. 03/02/22   Corwin Antu, FNP  losartan (COZAAR) 100 MG tablet Take 100 mg by mouth daily. 08/09/23   [provider]  methylPREDNISolone (MEDROL DOSEPAK) 4 MG TBPK tablet Take by mouth. Pt. On 3 day Patient not taking: Reported on 10/28/2023    [provider]  metoprolol  succinate (TOPROL -XL) 100 MG 24 hr tablet TAKE 1 TABLET BY MOUTH AT  BEDTIME. TAKE WITH OR IMMEDIATELY FOLLOWING A MEAL. 01/17/23   Darron Deatrice LABOR, MD  nitroGLYCERIN  (NITROSTAT ) 0.4 MG SL tablet Place 1 tablet (0.4 mg total) under the tongue every 5 (five) minutes as needed for chest pain. 03/04/20   Darron Deatrice LABOR, MD  omeprazole  (PRILOSEC) 20 MG capsule TAKE 1 CAPSULE BY MOUTH EVERY DAY 08/20/23   Wendee Lynwood HERO, NP  rosuvastatin  (CRESTOR ) 40 MG tablet TAKE 1 TABLET BY MOUTH EVERY DAY Patient taking differently: Take 40 mg by mouth at bedtime. 08/09/23   Darron Deatrice LABOR, MD  traMADol  (ULTRAM ) 50 MG tablet Take 1 tablet (50 mg total) by mouth every 6 (six) hours as needed for moderate pain (pain score 4-6). 08/30/23   Baglia, Corrina, PA-C  triamterene -hydrochlorothiazide  (MAXZIDE -25) 37.5-25 MG tablet TAKE 1 TABLET BY MOUTH EVERY DAY 08/09/23   Darron Deatrice LABOR, MD  Vitamin D , Ergocalciferol , (DRISDOL ) 1.25 MG (50000 UNIT) CAPS capsule Take 1 capsule (50,000 Units total) by mouth every 7 (seven) days. 11/12/22   Wendee Lynwood HERO, NP    Family History  Problem Relation Age of Onset   Congestive Heart Failure Mother 56   Lung cancer Mother    Heart attack Father 94   Factor V Leiden deficiency Sister    Factor V Leiden deficiency Brother    Coronary artery disease Other         fam hx, early   Heart attack Other 56       uncle   Colon cancer Neg Hx      Social History   Tobacco Use   Smoking status: Former    Current packs/day: 0.00    Average packs/day: 1 pack/day for 49.0 years (49.0 ttl pk-yrs)    Types: Cigarettes    Start date: 05/09/1973    Quit date: 05/09/2022    Years since quitting: 1.6   Smokeless tobacco: Never  Vaping Use   Vaping status: Never Used  Substance Use Topics   Alcohol use: Not Currently    Alcohol/week: 3.0 - 4.0 standard drinks of alcohol    Types: 3 - 4 Cans of beer per week   Drug use: Not Currently    Types: Marijuana    Comment: once a week; Last use 06/2023 pt informed to withhold 24-48 hrs prior to procedure     Allergies as of 01/02/2024 - Review Complete 01/02/2024  Allergen Reaction Noted   Cucumber extract Anaphylaxis and Other (See Comments) 08/09/2020   Amoxicillin Rash 07/10/2010   Penicillins Rash 12/21/2011    Review of Systems:    All systems reviewed and negative except where noted in HPI.   Physical Exam:  BP 138/70   Pulse 65   Ht 5' 10 (1.778 m)   Wt 241 lb 6  oz (109.5 kg)   SpO2 96%   BMI 34.63 kg/m  No LMP for male patient.  General:   Alert,  Well-developed, well-nourished, pleasant and cooperative in NAD Lungs:  Respirations even and unlabored.  Clear throughout to auscultation.   No wheezes, crackles, or rhonchi. No acute distress. Heart:  Regular rate and rhythm; no murmurs, clicks, rubs, or gallops. Abdomen:  Normal bowel sounds.  No bruits.  Soft, and non-distended without masses, hepatosplenomegaly.  Midline ventral hernia/rectus diastases which is not tender and spontaneously reduces.  No Tenderness.  No guarding or rebound tenderness.    Neurologic:  Alert and oriented x3;  grossly normal neurologically. Psych:  Alert and cooperative. Normal mood and affect.  Imaging Studies: No results found.  Labs: CBC    Component Value Date/Time   WBC 10.1 07/27/2023 1244   RBC 3.34 (L) 07/27/2023 1244   HGB 10.9 (L) 08/30/2023 0627   HGB 14.3 09/26/2020 0833   HCT 32.0 (L) 08/30/2023 0627   HCT 41.9 09/26/2020 0833   PLT 232.0 07/27/2023 1244   PLT 252 09/26/2020 0833   MCV 97.6 07/27/2023 1244   MCV 89 09/26/2020 0833   MCH 29.9 02/01/2022 0855   MCHC 30.8 07/27/2023 1244   RDW 15.8 (H) 07/27/2023 1244   RDW 13.8 09/26/2020 0833   LYMPHSABS 2.0 09/16/2021 0926   LYMPHSABS 2.5 09/26/2020 0833   MONOABS 0.7 09/16/2021 0926   EOSABS 0.4 09/16/2021 0926   EOSABS 0.4 09/26/2020 0833   BASOSABS 0.1 09/16/2021 0926   BASOSABS 0.1 09/26/2020 0833    CMP     Component Value Date/Time   NA 135 08/30/2023 0627   NA 142 08/05/2022 0000   K 5.6 (H)  08/30/2023 0627   CL 103 08/30/2023 0627   CO2 16 (L) 08/05/2023 1210   GLUCOSE 87 08/30/2023 0627   BUN 77 (H) 08/30/2023 0627   BUN 62 (A) 08/05/2022 0000   CREATININE 4.90 (H) 08/30/2023 0627   CREATININE 1.07 05/17/2016 1037   CALCIUM  7.8 (L) 08/05/2023 1210   CALCIUM  8.1 07/01/2023 0000   PROT 6.9 11/10/2022 0920   PROT 6.0 01/19/2021 0833   ALBUMIN 3.7 11/10/2022 0920   ALBUMIN 3.5 (L) 01/19/2021 0833   AST 16 11/10/2022 0920   ALT 10 11/10/2022 0920   ALKPHOS 32 (L) 11/10/2022 0920   BILITOT 0.3 11/10/2022 0920   BILITOT 0.2 01/19/2021 0833   GFRNONAA 51 (L) 08/09/2020 1558   GFRAA 79 10/30/2019 0912    Assessment and Plan:   OBEDIAH WELLES is a 63 y.o. y/o male has been referred for:   1.  Colon cancer screening - Scheduling Colonoscopy I discussed risks of colonoscopy with patient to include risk of bleeding, colon perforation, and risk of sedation.  Patient expressed understanding and agrees to proceed with colonoscopy.   2.  ESRD on Dialysis M/W/F; currently undergoing kidney transplant evaluation; needs updated screening colonoscopy. - Schedule colonoscopy in hospital on Tue or Thurs. I discussed risks of colonoscopy with patient to include risk of bleeding, colon perforation, and risk of sedation.  Patient expressed understanding and agrees to proceed with colonoscopy.   3.  Comorbidities:  ESRD on Dialysis,  CAD, HTN, history of MI, PVD, COPD.  - We are requesting cardiology permission to hold Pletal  2 days before Colonoscopy procedure.  Follow up As Needed based on Colonoscopy results.  Ellouise Console, PA-C

## 2024-01-02 ENCOUNTER — Other Ambulatory Visit: Payer: Self-pay | Admitting: *Deleted

## 2024-01-02 ENCOUNTER — Ambulatory Visit (INDEPENDENT_AMBULATORY_CARE_PROVIDER_SITE_OTHER): Admitting: Physician Assistant

## 2024-01-02 ENCOUNTER — Encounter: Payer: Self-pay | Admitting: Physician Assistant

## 2024-01-02 ENCOUNTER — Telehealth: Payer: Self-pay

## 2024-01-02 VITALS — BP 138/70 | HR 65 | Ht 70.0 in | Wt 241.4 lb

## 2024-01-02 DIAGNOSIS — E785 Hyperlipidemia, unspecified: Secondary | ICD-10-CM

## 2024-01-02 DIAGNOSIS — I2510 Atherosclerotic heart disease of native coronary artery without angina pectoris: Secondary | ICD-10-CM

## 2024-01-02 DIAGNOSIS — Z7901 Long term (current) use of anticoagulants: Secondary | ICD-10-CM

## 2024-01-02 DIAGNOSIS — Z992 Dependence on renal dialysis: Secondary | ICD-10-CM

## 2024-01-02 DIAGNOSIS — N186 End stage renal disease: Secondary | ICD-10-CM | POA: Diagnosis not present

## 2024-01-02 DIAGNOSIS — Z1211 Encounter for screening for malignant neoplasm of colon: Secondary | ICD-10-CM

## 2024-01-02 DIAGNOSIS — N2581 Secondary hyperparathyroidism of renal origin: Secondary | ICD-10-CM | POA: Diagnosis not present

## 2024-01-02 DIAGNOSIS — I10 Essential (primary) hypertension: Secondary | ICD-10-CM

## 2024-01-02 MED ORDER — NITROGLYCERIN 0.4 MG SL SUBL: 0.4000 mg | SUBLINGUAL_TABLET | SUBLINGUAL | 0 refills | Status: AC | PRN

## 2024-01-02 NOTE — Telephone Encounter (Signed)
 It is reasonable to hold cilostazol  for 2 days before the planned endoscopy and to resume it when it is felt safe to do so by GI.  Aspirin  81 mg daily should be continued during the periprocedural period.  Lonni Hanson, MD [facility]

## 2024-01-02 NOTE — Telephone Encounter (Signed)
 [facility] Pre-operative Risk Assessment     Request for surgical clearance:     Endoscopy Procedure  What type of surgery is being performed?     Colonoscopy  When is this surgery scheduled?     TBD  What type of clearance is required ?   Pharmacy  Are there any medications that need to be held prior to surgery and how long? Pletal  2 days  Practice name and name of physician performing surgery?      [facility]  What is your office phone and fax number?      Phone- [phone]  Fax- [phone]  Anesthesia type (None, local, MAC, general) ?       MAC   Please route your response to Alethea Blocker, CMA

## 2024-01-02 NOTE — Patient Instructions (Signed)
 We will give you a call once our schedule opens up for October to schedule your Colonoscopy   Please follow up sooner if symptoms increase or worsen  Due to recent changes in healthcare laws, you may see the results of your imaging and laboratory studies on MyChart before your provider has had a chance to review them.  We understand that in some cases there may be results that are confusing or concerning to you. Not all laboratory results come back in the same time frame and the provider may be waiting for multiple results in order to interpret others.  Please give us  48 hours in order for your provider to thoroughly review all the results before contacting the office for clarification of your results.   Thank you for trusting me with your gastrointestinal care!   Ellouise Console, PA-C _______________________________________________________  If your blood pressure at your visit was 140/90 or greater, please contact your primary care physician to follow up on this.  _______________________________________________________  If you are age 8 or older, your body mass index should be between 23-30. Your Body mass index is 34.63 kg/m. If this is out of the aforementioned range listed, please consider follow up with your Primary Care Provider.  If you are age 72 or younger, your body mass index should be between 19-25. Your Body mass index is 34.63 kg/m. If this is out of the aformentioned range listed, please consider follow up with your Primary Care Provider.   ________________________________________________________  The [facility] providers would like to encourage you to use MYCHART to communicate with providers for non-urgent requests or questions.  Due to long hold times on the telephone, sending your provider a message by [facility] may be a faster and more efficient way to get a response.  Please allow 48 business hours for a response.  Please remember that this is for non-urgent requests.   _______________________________________________________

## 2024-01-02 NOTE — Telephone Encounter (Signed)
 Primary Cardiologist:None  Chart reviewed as part of pre-operative protocol coverage. Because of David Cowan's past medical history and time since last visit, he/she will require a follow-up visit in order to better assess preoperative cardiovascular risk.  Pre-op  covering staff: - Please schedule appointment and call patient to inform them. - Please contact requesting surgeon's office via preferred method (i.e, phone, fax) to inform them of need for appointment prior to surgery.  If applicable, this message will also be routed to pharmacy pool and/or primary cardiologist for input on holding anticoagulant/antiplatelet agent as requested below so that this information is available at time of patient's appointment.   David EMERSON Bane, NP-C  01/02/2024, 10:48 AM [address], [address] [place], [place] [address] Office [phone] Fax [phone]

## 2024-01-02 NOTE — Telephone Encounter (Signed)
 Dr. Darron , patient's chart was reviewed for preoperative cardiac evaluation.  Can he hold Pletal  for 2 days prior to colonoscopy?   Please route your response to p cv div preop.  Thank you, Rosaline EMERSON Bane, NP-C 01/02/2024, 10:49 AM

## 2024-01-03 NOTE — Telephone Encounter (Signed)
 Called and spoke to patient to schedule IN OFFICE visit for clearance patient stated he does not have procedure scheduled yet and he is going to go on vacation and did not have a calendar in front of him at the moment of the call therefore he will call back to schedule his appointment when he is ready to do so. Will remove patient from preop pool

## 2024-01-04 ENCOUNTER — Other Ambulatory Visit: Payer: Self-pay | Admitting: Cardiovascular Disease

## 2024-01-04 DIAGNOSIS — I2510 Atherosclerotic heart disease of native coronary artery without angina pectoris: Secondary | ICD-10-CM

## 2024-01-04 DIAGNOSIS — N2581 Secondary hyperparathyroidism of renal origin: Secondary | ICD-10-CM | POA: Diagnosis not present

## 2024-01-04 DIAGNOSIS — I10 Essential (primary) hypertension: Secondary | ICD-10-CM

## 2024-01-04 DIAGNOSIS — Z992 Dependence on renal dialysis: Secondary | ICD-10-CM | POA: Diagnosis not present

## 2024-01-04 DIAGNOSIS — E785 Hyperlipidemia, unspecified: Secondary | ICD-10-CM

## 2024-01-04 DIAGNOSIS — N186 End stage renal disease: Secondary | ICD-10-CM | POA: Diagnosis not present

## 2024-01-05 MED ORDER — METOPROLOL SUCCINATE ER 100 MG PO TB24
ORAL_TABLET | ORAL | 0 refills | Status: DC
Start: 2024-01-05 — End: 2024-02-08

## 2024-01-05 MED ORDER — TRIAMTERENE-HCTZ 37.5-25 MG PO TABS: 1.0000 | ORAL_TABLET | Freq: Every day | ORAL | 0 refills | Status: DC

## 2024-01-05 MED ORDER — ROSUVASTATIN CALCIUM 40 MG PO TABS: 40.0000 mg | ORAL_TABLET | Freq: Every day | ORAL | 0 refills | Status: DC

## 2024-01-05 MED ORDER — FENOFIBRATE 160 MG PO TABS: 160.0000 mg | ORAL_TABLET | Freq: Every day | ORAL | 0 refills | Status: DC

## 2024-01-06 DIAGNOSIS — N2581 Secondary hyperparathyroidism of renal origin: Secondary | ICD-10-CM | POA: Diagnosis not present

## 2024-01-06 DIAGNOSIS — Z992 Dependence on renal dialysis: Secondary | ICD-10-CM | POA: Diagnosis not present

## 2024-01-06 DIAGNOSIS — N186 End stage renal disease: Secondary | ICD-10-CM | POA: Diagnosis not present

## 2024-01-09 DIAGNOSIS — Z992 Dependence on renal dialysis: Secondary | ICD-10-CM | POA: Diagnosis not present

## 2024-01-09 DIAGNOSIS — N186 End stage renal disease: Secondary | ICD-10-CM | POA: Diagnosis not present

## 2024-01-11 DIAGNOSIS — Z992 Dependence on renal dialysis: Secondary | ICD-10-CM | POA: Diagnosis not present

## 2024-01-11 DIAGNOSIS — N186 End stage renal disease: Secondary | ICD-10-CM | POA: Diagnosis not present

## 2024-01-13 DIAGNOSIS — N186 End stage renal disease: Secondary | ICD-10-CM | POA: Diagnosis not present

## 2024-01-13 DIAGNOSIS — Z992 Dependence on renal dialysis: Secondary | ICD-10-CM | POA: Diagnosis not present

## 2024-01-16 ENCOUNTER — Telehealth: Payer: Self-pay | Admitting: Physician Assistant

## 2024-01-16 ENCOUNTER — Telehealth: Payer: Self-pay

## 2024-01-16 DIAGNOSIS — Z1211 Encounter for screening for malignant neoplasm of colon: Secondary | ICD-10-CM

## 2024-01-16 DIAGNOSIS — Z992 Dependence on renal dialysis: Secondary | ICD-10-CM | POA: Diagnosis not present

## 2024-01-16 DIAGNOSIS — N186 End stage renal disease: Secondary | ICD-10-CM | POA: Diagnosis not present

## 2024-01-16 DIAGNOSIS — N2581 Secondary hyperparathyroidism of renal origin: Secondary | ICD-10-CM | POA: Diagnosis not present

## 2024-01-16 MED ORDER — NA SULFATE-K SULFATE-MG SULF 17.5-3.13-1.6 GM/177ML PO SOLN: 1.0000 | Freq: Once | ORAL | 0 refills | Status: AC

## 2024-01-16 NOTE — Addendum Note (Signed)
 Addended by: LANETTE ALETHEA CROME on: 01/16/2024 01:46 PM   Modules accepted: Orders

## 2024-01-16 NOTE — Telephone Encounter (Signed)
 Patient has received the instructions through mychart

## 2024-01-16 NOTE — Telephone Encounter (Signed)
1st attempt to reach pt regarding surgical clearance and the need for an IN-OFFICE appointment.  Left pt a message to call back and get that scheduled. 

## 2024-01-16 NOTE — Telephone Encounter (Signed)
 [facility] Pre-operative Risk Assessment     Request for surgical clearance:     Endoscopy Procedure  What type of surgery is being performed?     Colonoscopy  When is this surgery scheduled?     03/13/24  What type of clearance is required ?   Pharmacy  Are there any medications that need to be held prior to surgery and how long? Pletal  2 days  Practice name and name of physician performing surgery?       Gastroenterology  What is your office phone and fax number?      Phone- [phone]  Fax- [phone]  Anesthesia type (None, local, MAC, general) ?       MAC   Please route your response to Alethea Blocker, CMA

## 2024-01-16 NOTE — Telephone Encounter (Signed)
 Patient requesting a call to discuss scheduling colonoscopy at hospital. Please advise, thank you.

## 2024-01-16 NOTE — Telephone Encounter (Signed)
    Primary Cardiologist:None  Chart reviewed as part of pre-operative protocol coverage. Because of Lennis Korb Daman's past medical history and time since last visit, he/she will require a follow-up visit in order to better assess preoperative cardiovascular risk.  Pre-op  covering staff: - Please schedule office appointment and call patient to inform them. - Please contact requesting surgeon's office via preferred method (i.e, phone, fax) to inform them of need for appointment prior to surgery.  If applicable, this message will also be routed to pharmacy pool and/or primary cardiologist for input on holding anticoagulant/antiplatelet agent as requested below so that this information is available at time of patient's appointment.   His Pletal  may be held for 2 days prior to his procedure.  His aspirin  will need to be continued throughout the perioperative procedure.  Josefa CHRISTELLA Beauvais, NP  01/16/2024, 4:25 PM

## 2024-01-17 DIAGNOSIS — I083 Combined rheumatic disorders of mitral, aortic and tricuspid valves: Secondary | ICD-10-CM | POA: Diagnosis not present

## 2024-01-17 DIAGNOSIS — I371 Nonrheumatic pulmonary valve insufficiency: Secondary | ICD-10-CM | POA: Diagnosis not present

## 2024-01-17 DIAGNOSIS — R9439 Abnormal result of other cardiovascular function study: Secondary | ICD-10-CM | POA: Diagnosis not present

## 2024-01-17 DIAGNOSIS — Z01818 Encounter for other preprocedural examination: Secondary | ICD-10-CM | POA: Diagnosis not present

## 2024-01-17 DIAGNOSIS — Z01810 Encounter for preprocedural cardiovascular examination: Secondary | ICD-10-CM | POA: Diagnosis not present

## 2024-01-18 ENCOUNTER — Telehealth: Payer: Self-pay | Admitting: Internal Medicine

## 2024-01-18 ENCOUNTER — Ambulatory Visit: Admitting: Physician Assistant

## 2024-01-18 DIAGNOSIS — N186 End stage renal disease: Secondary | ICD-10-CM | POA: Diagnosis not present

## 2024-01-18 DIAGNOSIS — Z992 Dependence on renal dialysis: Secondary | ICD-10-CM | POA: Diagnosis not present

## 2024-01-18 DIAGNOSIS — N2581 Secondary hyperparathyroidism of renal origin: Secondary | ICD-10-CM | POA: Diagnosis not present

## 2024-01-18 NOTE — Telephone Encounter (Signed)
 Spoke to patient's wife advised we received a call from [facility] his stress test done yesterday is abnormal.Stated husband is at dialysis now.Appointment scheduled with Hao Meng PA Monday 8/11 at 10:30 am at [facility] office.Directions given.

## 2024-01-18 NOTE — Telephone Encounter (Signed)
 Priscilla from [facility] calling to report positive stress test for pt and would like advice. Please advise

## 2024-01-18 NOTE — Telephone Encounter (Signed)
 Spoke to [place] with kidney transplant at [facility] stated patient had a positive stress test done yesterday 8/5.She will fax report.Advised I will call patient and schedule him a appointment.  Called patient left message on personal voice mail to call back.

## 2024-01-18 NOTE — Telephone Encounter (Signed)
 Preop clearance appt now scheduled, patient agreed with appointment date and time. Patient also mentions the need for his follow up with Dr. Waddell, informed him that there currently werent any available slots open prior to his procedure date of 9/30.

## 2024-01-20 DIAGNOSIS — N2581 Secondary hyperparathyroidism of renal origin: Secondary | ICD-10-CM | POA: Diagnosis not present

## 2024-01-20 DIAGNOSIS — N186 End stage renal disease: Secondary | ICD-10-CM | POA: Diagnosis not present

## 2024-01-20 DIAGNOSIS — Z992 Dependence on renal dialysis: Secondary | ICD-10-CM | POA: Diagnosis not present

## 2024-01-22 NOTE — H&P (View-Only) (Signed)
 Cardiology Office Note:    Date:  01/23/2024   ID:  David Cowan, DOB December 03, 1960, MRN [id number]  PCP:  David Lynwood HERO, NP   David Cowan Cardiologist:  David Birmingham, MD     Referring MD: David Lynwood HERO, NP   Chief complaint: Cardiac workup prior to kidney transplant  History of Present Illness:    David Cowan is a 63 y.o. male with a hx of ESRD secondary to FSGS, prediabetes, hypertension, CAD, NSTEMI, COPD, PAD presents to the office today for cardiac risk evaluation prior to kidney transplant.  Patient does currently receive dialysis with a right brachiocephalic AVF on M-W-F.  Patient does have a remote history of an NSTEMI over 20 years ago in 2002, cath procedure was performed by David Cowan.  Cath results showed a left main with 25% ostial stenosis. A long segment of stenosis in the LAD around 25%, the second diagonal was a large vessel with high-grade disease in a subbranch that was occluded at the ostium. The circumflex had luminal irregularities at the AV groove, a large obtuse marginal had a 50% mid stenosis. RCA was nondominant, with a long 70% stenosis around the mid segment, and a small RV branch with 99% proximal stenosis. Last seen with our office by David Cowan in April 2024 for management of his peripheral arterial disease with statin therapy, aspirin , and Pletal .  During [facility] workup [facility] for [facility] he received a TTE on 01/17/2024, and a nuclear stress test the same day.  The TTE showed left ventricular size is normal with moderate concentric hypertrophy.  LVEF 55-60%, no RWMA, mildly dilated right ventricle, focal thickening and restriction of the NCC, mild aortic regurgitation and aortic stenosis, trace mitral, pulmonic, and tricuspid regurgitation.  The nuclear stress test on 01/17/2024 showed a small size, mild to moderate intensity, predominantly reversible perfusion defects involving the distal LAD and circumflex territory, suggestive of  ischemia, LVEF 56% during rest and 55% during stress, no regional wall motion abnormality.  David Cowan presents to the office today with his wife, he states that he is doing well from a cardiac standpoint.  He denies any chest pain, palpitations, shortness of breath, nausea, vomiting, blood in stool, worsening leg swelling, syncopal Cowan near syncopal episodes. States the only time he gets dizzy is directly after dialysis when they remove a large amount of fluid off him.  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  Currently still receives dialysis M-W-F, reports no issues with his R BC AVF Cowan his ability to access dialysis services.  States he is still able to make urine, and that this has increased since changing from Maxzide  to furosemide 40 mg daily.  Considering patient's previous NSTEMI with mild to moderate multivessel disease in 2002 and recently positive nuclear stress test encompassing perfusion defect in LAD and circumflex territories, discussed case with David Cowan in the office who believes the patient is a candidate for a left heart cath with coronary angiography.    Past Medical History:  Diagnosis Date   Arthritis    Bronchitis    CAD (coronary artery disease)    Chronic kidney disease    level 5   COPD (chronic obstructive pulmonary disease) (HCC)    mild   Dyslipidemia    Hand pain    occasional in right hand   Hard of hearing  left ear, no hearing aid   History of shingles    Hyperlipidemia    Hypertension    Myocardial infarction [facility]) 2002   Right flank pain     Past Surgical History:  Procedure Laterality Date   AV FISTULA PLACEMENT Right 08/30/2023   Procedure: ARTERIOVENOUS (AV) FISTULA CREATION;  Surgeon: David Norman RAMAN, MD;  Location: [facility];  Service: Vascular;  Laterality: Right;   CARDIAC CATHETERIZATION  07/21/2000   COLONOSCOPY     RENAL BIOPSY      TOTAL HIP ARTHROPLASTY Left 12/10/2014   Procedure: LEFT TOTAL HIP ARTHROPLASTY ANTERIOR APPROACH;  Surgeon: David Car, MD;  Location: [facility];  Service: Orthopedics;  Laterality: Left;   VASECTOMY      Current Medications: Current Meds  Medication Sig   albuterol  (VENTOLIN  HFA) 108 (90 Base) MCG/ACT inhaler Inhale 2 puffs into the lungs every 6 (six) hours as needed for wheezing Cowan shortness of breath.   aspirin  81 MG tablet Take 1 tablet (81 mg total) by mouth daily.   cetirizine (ZYRTEC) 10 MG tablet Take 10 mg by mouth daily as needed for allergies.   cilostazol  (PLETAL ) 100 MG tablet TAKE 1 TABLET BY MOUTH TWICE A DAY   Evolocumab  (REPATHA  SURECLICK) 140 MG/ML SOAJ INJECT 140 MG INTO THE SKIN EVERY 14 (FOURTEEN) DAYS.   fenofibrate  160 MG tablet Take 1 tablet (160 mg total) by mouth daily.   fluticasone  (FLONASE ) 50 MCG/ACT nasal spray Place 2 sprays into both nostrils at bedtime. (Patient taking differently: Place 2 sprays into both nostrils as needed for allergies Cowan rhinitis.)   furosemide (LASIX) 40 MG tablet Take 40 mg by mouth daily.   lidocaine -prilocaine  (EMLA ) cream Apply 1 Application topically once.   metoprolol  succinate (TOPROL -XL) 100 MG 24 hr tablet TAKE 1 TABLET BY MOUTH AT  BEDTIME. TAKE WITH Cowan  IMMEDIATELY FOLLOWING A  MEAL.   nitroGLYCERIN  (NITROSTAT ) 0.4 MG SL tablet Place 1 tablet (0.4 mg total) under the tongue every 5 (five) minutes as needed for chest pain.   rosuvastatin  (CRESTOR ) 40 MG tablet Take 1 tablet (40 mg total) by mouth daily.   sevelamer  carbonate (RENVELA ) 800 MG tablet Take 2 tablets by mouth 3 (three) times daily with meals.   Vitamin D , Ergocalciferol , (DRISDOL ) 1.25 MG (50000 UNIT) CAPS capsule Take 1 capsule (50,000 Units total) by mouth every 7 (seven) days.     Allergies:   Cucumber extract, Amoxicillin, and Penicillins   Social History   Socioeconomic History   Marital status: Married    Spouse name: [name]   Number of children: 2    Years of education: Not on file   Highest education level: Not on file  Occupational History   Occupation: SERVICE MANAGER    Employer: [employer]  Tobacco Use   Smoking status: Former    Current packs/day: 0.00    Average packs/day: 1 pack/day for 49.0 years (49.0 ttl pk-yrs)    Types: Cigarettes    Start date: 05/09/1973    Quit date: 05/09/2022    Years since quitting: 1.7   Smokeless tobacco: Never  Vaping Use   Vaping status: Never Used  Substance and Sexual Activity   Alcohol use: Not Currently    Alcohol/week: 3.0 - 4.0 standard drinks of alcohol    Types: 3 - 4 Cans of beer per week   Drug use: Not Currently    Types: Marijuana    Comment: once a week; Last use 06/2023 pt informed  to withhold 24-48 hrs prior to procedure   Sexual activity: Not Currently  Other Topics Concern   Not on file  Social History Narrative   2 daughters      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 There is no history of Colon cancer.  ROS:   Please see the history of present illness.     All other systems reviewed and are negative.  EKGs/Labs/Other Studies Reviewed:    The following studies were reviewed today:  EKG Interpretation Date/Time:  Monday January 23 2024 10:08:01 EDT Ventricular Rate:  82 PR Interval:  168 QRS Duration:  94 QT Interval:  394 QTC Calculation: 460 R Axis:   59  Text  Interpretation: Normal sinus rhythm Normal ECG When compared with ECG of 09-Aug-2020 15:19, No significant change was found Confirmed by Janene Boer [phone]) on 01/23/2024 4:47:17 PM    Recent Labs: 07/27/2023: Platelets 232.0 08/30/2023: BUN 77; Creatinine, Ser 4.90; Hemoglobin 10.9; Potassium 5.6; Sodium 135  Recent Lipid Panel    Component Value Date/Time   CHOL 87 11/10/2022 0920   CHOL 87 (L) 04/14/2021 0930   TRIG 135.0 11/10/2022 0920   HDL 33.90 (L) 11/10/2022 0920   HDL 40 04/14/2021 0930   CHOLHDL 3 11/10/2022 0920   VLDL 27.0 11/10/2022 0920   LDLCALC 26 11/10/2022 0920   LDLCALC 27 04/14/2021 0930   LDLDIRECT 95.7 01/05/2013 0946     Risk Assessment/Calculations:    Perioperative Risk of Major Cardiac Event is (%): 11               Physical Exam:    VS:  BP (!) 130/50   Pulse 82   Ht 5' 10 (1.778 m)   Wt 245 lb 9.6 oz (111.4 kg)   SpO2 98%   BMI 35.24 kg/m     Wt Readings from Last 3 Encounters:  01/23/24 245 lb 9.6 oz (111.4 kg)  01/02/24 241 lb 6 oz (109.5 kg)  08/30/23 246 lb (111.6 kg)     GEN: Well nourished, well developed in no acute distress HEENT: Normal NECK: No carotid bruits CARDIAC: S1-S2 normal, RRR, no murmurs, rubs, gallops RESPIRATORY:  Clear to auscultation without rales, wheezing Cowan rhonchi  ABDOMEN: Soft, non-tender, non-distended MUSCULOSKELETAL:  No edema; No deformity, audible bruit and palpable thrill at R [facility] AVF.  2+ left radial pulse, 1+ right radial pulse, DP and PT pulses palpable bilaterally SKIN: Warm and dry NEUROLOGIC:  Alert and oriented x 3 PSYCHIATRIC:  Normal affect   ASSESSMENT:    1. Positive cardiac stress test   2. Coronary artery disease involving native coronary artery of native heart without angina pectoris   3. CKD (chronic kidney disease) stage 5, GFR less than 15 ml/min (HCC)   4. Essential hypertension   5. Hyperlipidemia, unspecified hyperlipidemia type    PLAN:    In order of  problems listed  above:  Positive cardiac stress test - Based on findings from the 2002 NSTEMI/cardiac cath of mild to moderate multivessel disease and positive nuclear stress test performed by [facility] on 01/17/2024, discussed case with David Cowan who believes the patient will require a left heart catheterization with coronary angiography in order to move forward with cardiac risk assessment for upcoming transplant surgery.  Coronary artery disease involving native coronary artery of native heart without angina pectoris  -See above  CKD (chronic kidney disease) stage V, GFR less than 15 mL/min - Currently being evaluated with [facility] for possible kidney transplant - Managed by nephrology  Essential hypertension  - Patient states his pressures have been okay at home  - Continue metoprolol   Hyperlipidemia  - Placed order for fasting lipid panel in office today       Informed Consent   Shared Decision Making/Informed Consent The risks [stroke (1 in 1000), death (1 in 1000), kidney failure [usually temporary] (1 in 500), bleeding (1 in 200), allergic reaction [possibly serious] (1 in 200)], benefits (diagnostic support and management of coronary artery disease) and alternatives of a cardiac catheterization were discussed in detail with Mr. Gayden and he is willing to proceed.       Medication Adjustments/Labs and Tests Ordered: Current medicines are reviewed at length with the patient today.  Concerns regarding medicines are outlined above.  Orders Placed This Encounter  Procedures   CBC   EKG 12-Lead   No orders of the defined types were placed in this encounter.   Patient Instructions  Medication Instructions:  Your physician recommends that you continue on your current medications as directed. Please refer to the Current Medication list given to you today.  *If you need a refill on your cardiac medications before your next appointment, please call your pharmacy*  Lab Work: BMET & CBC prior to  Cath  If you have labs (blood work) drawn today and your tests are completely normal, you will receive your results only by: MyChart Message (if you have MyChart) Cowan A paper copy in the mail If you have any lab test that is abnormal Cowan we need to change your treatment, we will call you to review the results.  Testing/Procedures: Your physician has requested that you have a cardiac catheterization. Cardiac catheterization is used to diagnose and/Cowan treat various heart conditions. Doctors may recommend this procedure for a number of different reasons. The most common reason is to evaluate chest pain. Chest pain can be a symptom of coronary artery disease (CAD), and cardiac catheterization can show whether plaque is narrowing Cowan blocking your heart's arteries. This procedure is also used to evaluate the valves, as well as measure the blood flow and oxygen levels in different parts of your heart. For further information please visit [email or web address]   We will be in contact with you with a date and instructions for the cath.   Follow-Up: At [facility], you and your health needs are our priority.  As part of our continuing mission to provide you with exceptional heart care, our Cowan are all part of one team.  This team includes your primary Cardiologist (physician) and [facility]an APPs (Physician Assistants and Nurse Practitioners) who all work together to provide you with the care you need, when you need it.  Your next appointment:   To be determined based on cath.      Signed, Miriam FORBES Shams, NP  01/23/2024 5:19 PM    [facility]

## 2024-01-22 NOTE — Progress Notes (Signed)
 Cardiology Office Note:    Date:  01/23/2024   ID:  DRE GAMINO, DOB [birth year], MRN [id number]  PCP:  Wendee Lynwood HERO, NP   [facility] Providers Cardiologist:  Danelle Birmingham, MD     Referring MD: Wendee Lynwood HERO, NP   Chief complaint: Cardiac workup prior to kidney transplant  History of Present Illness:    David Cowan is a 63 y.o. male with a hx of ESRD secondary to FSGS, prediabetes, hypertension, CAD, NSTEMI, COPD, PAD presents to the office today for cardiac risk evaluation prior to kidney transplant.  Patient does currently receive dialysis with a right brachiocephalic AVF on M-W-F.  Patient does have a remote history of an NSTEMI over 20 years ago in 2002, cath procedure was performed by Dr. Edison.  Cath results showed a left main with 25% ostial stenosis. A long segment of stenosis in the LAD around 25%, the second diagonal was a large vessel with high-grade disease in a subbranch that was occluded at the ostium. The circumflex had luminal irregularities at the AV groove, a large obtuse marginal had a 50% mid stenosis. RCA was nondominant, with a long 70% stenosis around the mid segment, and a small RV branch with 99% proximal stenosis. Last seen with our office by Dr. Darron in April 2024 for management of his peripheral arterial disease with statin therapy, aspirin , and Pletal .  During [facility] workup [facility] for [facility] he received a TTE on 01/17/2024, and a nuclear stress test the same day.  The TTE showed left ventricular size is normal with moderate concentric hypertrophy.  LVEF 55-60%, no RWMA, mildly dilated right ventricle, focal thickening and restriction of the NCC, mild aortic regurgitation and aortic stenosis, trace mitral, pulmonic, and tricuspid regurgitation.  The nuclear stress test on 01/17/2024 showed a small size, mild to moderate intensity, predominantly reversible perfusion defects involving the distal LAD and circumflex territory, suggestive of  ischemia, LVEF 56% during rest and 55% during stress, no regional wall motion abnormality.  David Cowan presents to the office today with his wife, he states that he is doing well from a cardiac standpoint.  He denies any chest pain, palpitations, shortness of breath, nausea, vomiting, blood in stool, worsening leg swelling, syncopal or near syncopal episodes. States the only time he gets dizzy is directly after dialysis when they remove a large amount of fluid off him.  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  Currently still receives dialysis M-W-F, reports no issues with his R BC AVF or his ability to access dialysis services.  States he is still able to make urine, and that this has increased since changing from Maxzide  to furosemide 40 mg daily.  Considering patient's previous NSTEMI with mild to moderate multivessel disease in 2002 and recently positive nuclear stress test encompassing perfusion defect in LAD and circumflex territories, discussed case with Dr. Ladona in the office who believes the patient is a candidate for a left heart cath with coronary angiography.    Past Medical History:  Diagnosis Date   Arthritis    Bronchitis    CAD (coronary artery disease)    Chronic kidney disease    level 5   COPD (chronic obstructive pulmonary disease) (HCC)    mild   Dyslipidemia    Hand pain    occasional in right hand   Hard of hearing  left ear, no hearing aid   History of shingles    Hyperlipidemia    Hypertension    Myocardial infarction [facility]) 2002   Right flank pain     Past Surgical History:  Procedure Laterality Date   AV FISTULA PLACEMENT Right 08/30/2023   Procedure: ARTERIOVENOUS (AV) FISTULA CREATION;  Surgeon: Pearline Norman RAMAN, MD;  Location: [facility] OR;  Service: Vascular;  Laterality: Right;   CARDIAC CATHETERIZATION  07/21/2000   COLONOSCOPY     RENAL BIOPSY      TOTAL HIP ARTHROPLASTY Left 12/10/2014   Procedure: LEFT TOTAL HIP ARTHROPLASTY ANTERIOR APPROACH;  Surgeon: Donnice Car, MD;  Location: [facility] ORS;  Service: Orthopedics;  Laterality: Left;   VASECTOMY      Current Medications: Current Meds  Medication Sig   albuterol  (VENTOLIN  HFA) 108 (90 Base) MCG/ACT inhaler Inhale 2 puffs into the lungs every 6 (six) hours as needed for wheezing or shortness of breath.   aspirin  81 MG tablet Take 1 tablet (81 mg total) by mouth daily.   cetirizine (ZYRTEC) 10 MG tablet Take 10 mg by mouth daily as needed for allergies.   cilostazol  (PLETAL ) 100 MG tablet TAKE 1 TABLET BY MOUTH TWICE A DAY   Evolocumab  (REPATHA  SURECLICK) 140 MG/ML SOAJ INJECT 140 MG INTO THE SKIN EVERY 14 (FOURTEEN) DAYS.   fenofibrate  160 MG tablet Take 1 tablet (160 mg total) by mouth daily.   fluticasone  (FLONASE ) 50 MCG/ACT nasal spray Place 2 sprays into both nostrils at bedtime. (Patient taking differently: Place 2 sprays into both nostrils as needed for allergies or rhinitis.)   furosemide (LASIX) 40 MG tablet Take 40 mg by mouth daily.   lidocaine -prilocaine  (EMLA ) cream Apply 1 Application topically once.   metoprolol  succinate (TOPROL -XL) 100 MG 24 hr tablet TAKE 1 TABLET BY MOUTH AT  BEDTIME. TAKE WITH OR  IMMEDIATELY FOLLOWING A  MEAL.   nitroGLYCERIN  (NITROSTAT ) 0.4 MG SL tablet Place 1 tablet (0.4 mg total) under the tongue every 5 (five) minutes as needed for chest pain.   rosuvastatin  (CRESTOR ) 40 MG tablet Take 1 tablet (40 mg total) by mouth daily.   sevelamer  carbonate (RENVELA ) 800 MG tablet Take 2 tablets by mouth 3 (three) times daily with meals.   Vitamin D , Ergocalciferol , (DRISDOL ) 1.25 MG (50000 UNIT) CAPS capsule Take 1 capsule (50,000 Units total) by mouth every 7 (seven) days.     Allergies:   Cucumber extract, Amoxicillin, and Penicillins   Social History   Socioeconomic History   Marital status: Married    Spouse name: [name]   Number of children: 2    Years of education: Not on file   Highest education level: Not on file  Occupational History   Occupation: SERVICE MANAGER    Employer: [employer]  Tobacco Use   Smoking status: Former    Current packs/day: 0.00    Average packs/day: 1 pack/day for 49.0 years (49.0 ttl pk-yrs)    Types: Cigarettes    Start date: 05/09/1973    Quit date: 05/09/2022    Years since quitting: 1.7   Smokeless tobacco: Never  Vaping Use   Vaping status: Never Used  Substance and Sexual Activity   Alcohol use: Not Currently    Alcohol/week: 3.0 - 4.0 standard drinks of alcohol    Types: 3 - 4 Cans of beer per week   Drug use: Not Currently    Types: Marijuana    Comment: once a week; Last use 06/2023 pt informed  to withhold 24-48 hrs prior to procedure   Sexual activity: Not Currently  Other Topics Concern   Not on file  Social History Narrative   2 daughters      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 There is no history of Colon cancer.  ROS:   Please see the history of present illness.     All other systems reviewed and are negative.  EKGs/Labs/Other Studies Reviewed:    The following studies were reviewed today:  EKG Interpretation Date/Time:  Monday January 23 2024 10:08:01 EDT Ventricular Rate:  82 PR Interval:  168 QRS Duration:  94 QT Interval:  394 QTC Calculation: 460 R Axis:   59  Text  Interpretation: Normal sinus rhythm Normal ECG When compared with ECG of 09-Aug-2020 15:19, No significant change was found Confirmed by Janene Boer [phone]) on 01/23/2024 4:47:17 PM    Recent Labs: 07/27/2023: Platelets 232.0 08/30/2023: BUN 77; Creatinine, Ser 4.90; Hemoglobin 10.9; Potassium 5.6; Sodium 135  Recent Lipid Panel    Component Value Date/Time   CHOL 87 11/10/2022 0920   CHOL 87 (L) 04/14/2021 0930   TRIG 135.0 11/10/2022 0920   HDL 33.90 (L) 11/10/2022 0920   HDL 40 04/14/2021 0930   CHOLHDL 3 11/10/2022 0920   VLDL 27.0 11/10/2022 0920   LDLCALC 26 11/10/2022 0920   LDLCALC 27 04/14/2021 0930   LDLDIRECT 95.7 01/05/2013 0946     Risk Assessment/Calculations:    Perioperative Risk of Major Cardiac Event is (%): 11               Physical Exam:    VS:  BP (!) 130/50   Pulse 82   Ht 5' 10 (1.778 m)   Wt 245 lb 9.6 oz (111.4 kg)   SpO2 98%   BMI 35.24 kg/m     Wt Readings from Last 3 Encounters:  01/23/24 245 lb 9.6 oz (111.4 kg)  01/02/24 241 lb 6 oz (109.5 kg)  08/30/23 246 lb (111.6 kg)     GEN: Well nourished, well developed in no acute distress HEENT: Normal NECK: No carotid bruits CARDIAC: S1-S2 normal, RRR, no murmurs, rubs, gallops RESPIRATORY:  Clear to auscultation without rales, wheezing or rhonchi  ABDOMEN: Soft, non-tender, non-distended MUSCULOSKELETAL:  No edema; No deformity, audible bruit and palpable thrill at R [facility] AVF.  2+ left radial pulse, 1+ right radial pulse, DP and PT pulses palpable bilaterally SKIN: Warm and dry NEUROLOGIC:  Alert and oriented x 3 PSYCHIATRIC:  Normal affect   ASSESSMENT:    1. Positive cardiac stress test   2. Coronary artery disease involving native coronary artery of native heart without angina pectoris   3. CKD (chronic kidney disease) stage 5, GFR less than 15 ml/min (HCC)   4. Essential hypertension   5. Hyperlipidemia, unspecified hyperlipidemia type    PLAN:    In order of  problems listed  above:  Positive cardiac stress test - Based on findings from the 2002 NSTEMI/cardiac cath of mild to moderate multivessel disease and positive nuclear stress test performed by [facility] on 01/17/2024, discussed case with Dr. Ladona who believes the patient will require a left heart catheterization with coronary angiography in order to move forward with cardiac risk assessment for upcoming transplant surgery.  Coronary artery disease involving native coronary artery of native heart without angina pectoris  -See above  CKD (chronic kidney disease) stage V, GFR less than 15 mL/min - Currently being evaluated with [facility] for possible kidney transplant - Managed by nephrology  Essential hypertension  - Patient states his pressures have been okay at home  - Continue metoprolol   Hyperlipidemia  - Placed order for fasting lipid panel in office today       Informed Consent   Shared Decision Making/Informed Consent The risks [stroke (1 in 1000), death (1 in 1000), kidney failure [usually temporary] (1 in 500), bleeding (1 in 200), allergic reaction [possibly serious] (1 in 200)], benefits (diagnostic support and management of coronary artery disease) and alternatives of a cardiac catheterization were discussed in detail with David Cowan and he is willing to proceed.       Medication Adjustments/Labs and Tests Ordered: Current medicines are reviewed at length with the patient today.  Concerns regarding medicines are outlined above.  Orders Placed This Encounter  Procedures   CBC   EKG 12-Lead   No orders of the defined types were placed in this encounter.   Patient Instructions  Medication Instructions:  Your physician recommends that you continue on your current medications as directed. Please refer to the Current Medication list given to you today.  *If you need a refill on your cardiac medications before your next appointment, please call your pharmacy*  Lab Work: BMET & CBC prior to  Cath  If you have labs (blood work) drawn today and your tests are completely normal, you will receive your results only by: MyChart Message (if you have MyChart) OR A paper copy in the mail If you have any lab test that is abnormal or we need to change your treatment, we will call you to review the results.  Testing/Procedures: Your physician has requested that you have a cardiac catheterization. Cardiac catheterization is used to diagnose and/or treat various heart conditions. Doctors may recommend this procedure for a number of different reasons. The most common reason is to evaluate chest pain. Chest pain can be a symptom of coronary artery disease (CAD), and cardiac catheterization can show whether plaque is narrowing or blocking your heart's arteries. This procedure is also used to evaluate the valves, as well as measure the blood flow and oxygen levels in different parts of your heart. For further information please visit [email or web address]   We will be in contact with you with a date and instructions for the cath.   Follow-Up: At [facility], you and your health needs are our priority.  As part of our continuing mission to provide you with exceptional heart care, our providers are all part of one team.  This team includes your primary Cardiologist (physician) and Advanced Practice Providers or APPs (Physician Assistants and Nurse Practitioners) who all work together to provide you with the care you need, when you need it.  Your next appointment:   To be determined based on cath.      Signed, Miriam FORBES Shams, NP  01/23/2024 5:19 PM    [facility]

## 2024-01-23 ENCOUNTER — Ambulatory Visit: Attending: Physician Assistant | Admitting: Emergency Medicine

## 2024-01-23 ENCOUNTER — Other Ambulatory Visit: Payer: Self-pay | Admitting: Physician Assistant

## 2024-01-23 ENCOUNTER — Telehealth: Payer: Self-pay

## 2024-01-23 ENCOUNTER — Encounter: Payer: Self-pay | Admitting: Physician Assistant

## 2024-01-23 VITALS — BP 130/50 | HR 82 | Ht 70.0 in | Wt 245.6 lb

## 2024-01-23 DIAGNOSIS — N2581 Secondary hyperparathyroidism of renal origin: Secondary | ICD-10-CM | POA: Diagnosis not present

## 2024-01-23 DIAGNOSIS — I10 Essential (primary) hypertension: Secondary | ICD-10-CM | POA: Diagnosis not present

## 2024-01-23 DIAGNOSIS — E785 Hyperlipidemia, unspecified: Secondary | ICD-10-CM

## 2024-01-23 DIAGNOSIS — N185 Chronic kidney disease, stage 5: Secondary | ICD-10-CM

## 2024-01-23 DIAGNOSIS — R9439 Abnormal result of other cardiovascular function study: Secondary | ICD-10-CM | POA: Diagnosis not present

## 2024-01-23 DIAGNOSIS — I2510 Atherosclerotic heart disease of native coronary artery without angina pectoris: Secondary | ICD-10-CM

## 2024-01-23 DIAGNOSIS — N186 End stage renal disease: Secondary | ICD-10-CM | POA: Diagnosis not present

## 2024-01-23 DIAGNOSIS — I739 Peripheral vascular disease, unspecified: Secondary | ICD-10-CM

## 2024-01-23 DIAGNOSIS — Z992 Dependence on renal dialysis: Secondary | ICD-10-CM | POA: Diagnosis not present

## 2024-01-23 NOTE — Telephone Encounter (Signed)
-----   Message from Miriam FORBES Shams sent at 01/23/2024  5:27 PM EDT ----- Regarding: Fasting lipid Please call Mr. Schuenemann and let him know I've added on a lipid panel to his blood work, and that he should be fasting when he arrives. Thank you!

## 2024-01-23 NOTE — Telephone Encounter (Signed)
 Called patient to let him know that lipid panel was added to his labs and to please be fasting. Patient agreed to plan.

## 2024-01-23 NOTE — Patient Instructions (Signed)
 Medication Instructions:  Your physician recommends that you continue on your current medications as directed. Please refer to the Current Medication list given to you today.  *If you need a refill on your cardiac medications before your next appointment, please call your pharmacy*  Lab Work: BMET & CBC prior to Cath  If you have labs (blood work) drawn today and your tests are completely normal, you will receive your results only by: MyChart Message (if you have MyChart) OR A paper copy in the mail If you have any lab test that is abnormal or we need to change your treatment, we will call you to review the results.  Testing/Procedures: Your physician has requested that you have a cardiac catheterization. Cardiac catheterization is used to diagnose and/or treat various heart conditions. Doctors may recommend this procedure for a number of different reasons. The most common reason is to evaluate chest pain. Chest pain can be a symptom of coronary artery disease (CAD), and cardiac catheterization can show whether plaque is narrowing or blocking your heart's arteries. This procedure is also used to evaluate the valves, as well as measure the blood flow and oxygen levels in different parts of your heart. For further information please visit [email or web address]   We will be in contact with you with a date and instructions for the cath.   Follow-Up: At [facility], you and your health needs are our priority.  As part of our continuing mission to provide you with exceptional heart care, our providers are all part of one team.  This team includes your primary Cardiologist (physician) and Advanced Practice Providers or APPs (Physician Assistants and Nurse Practitioners) who all work together to provide you with the care you need, when you need it.  Your next appointment:   To be determined based on cath.

## 2024-01-24 ENCOUNTER — Telehealth: Payer: Self-pay | Admitting: *Deleted

## 2024-01-24 DIAGNOSIS — Z01818 Encounter for other preprocedural examination: Secondary | ICD-10-CM | POA: Diagnosis not present

## 2024-01-24 NOTE — Telephone Encounter (Addendum)
 I confirmed with patient that his dialysis schedule is MWF. Patient was scheduled for Cardiac Cath Monday 02/06/24-patient will not be able to have dialysis at the hospital after cath since cath is outpatient procedure. I discussed with patient and have rescheduled Cardiac Cath to Thursday July 21 with Dr Darron with plan for patient to have dialysis at his usual dialysis center the following day on Friday. Patient will discuss this with his usual dialysis center tomorrow and call our office if they recommend different plan.  Cardiac Catheterization scheduled at [facility] for: Thursday January 02, 2024 9 AM Arrival time [facility] Main Entrance A at: 7 AM  Diet: -Nothing to eat after midnight prior to procedure.  Hydration: -May drink clear liquids until leaving for hospital. Approved liquids: Water , clear tea, black coffee, fruit juices-non-citric and without pulp,Gatorade, plain Jello/popsicles.  Medication instructions: -Hold:  Lasix-AM of procedure Other usual morning medications can be taken including aspirin  81 mg.  Plan to go home the same day, you will only stay overnight if medically necessary.  You must have responsible adult to drive you home.  Someone must be with you the first 24 hours after you arrive home.  Reviewed procedure instructions with patient.

## 2024-01-25 DIAGNOSIS — N2581 Secondary hyperparathyroidism of renal origin: Secondary | ICD-10-CM | POA: Diagnosis not present

## 2024-01-25 DIAGNOSIS — N186 End stage renal disease: Secondary | ICD-10-CM | POA: Diagnosis not present

## 2024-01-25 DIAGNOSIS — Z992 Dependence on renal dialysis: Secondary | ICD-10-CM | POA: Diagnosis not present

## 2024-01-25 NOTE — Progress Notes (Signed)
 Agree with the assessment and plan as outlined by Brigitte Canard, PA-C.

## 2024-01-26 ENCOUNTER — Encounter: Admitting: Nurse Practitioner

## 2024-01-27 ENCOUNTER — Other Ambulatory Visit: Payer: Self-pay | Admitting: *Deleted

## 2024-01-27 DIAGNOSIS — E782 Mixed hyperlipidemia: Secondary | ICD-10-CM

## 2024-01-27 DIAGNOSIS — Z01812 Encounter for preprocedural laboratory examination: Secondary | ICD-10-CM

## 2024-01-27 DIAGNOSIS — N185 Chronic kidney disease, stage 5: Secondary | ICD-10-CM | POA: Diagnosis not present

## 2024-01-27 DIAGNOSIS — N186 End stage renal disease: Secondary | ICD-10-CM | POA: Diagnosis not present

## 2024-01-27 DIAGNOSIS — I10 Essential (primary) hypertension: Secondary | ICD-10-CM

## 2024-01-27 DIAGNOSIS — Z992 Dependence on renal dialysis: Secondary | ICD-10-CM | POA: Diagnosis not present

## 2024-01-27 DIAGNOSIS — N2581 Secondary hyperparathyroidism of renal origin: Secondary | ICD-10-CM | POA: Diagnosis not present

## 2024-01-27 DIAGNOSIS — R9439 Abnormal result of other cardiovascular function study: Secondary | ICD-10-CM | POA: Diagnosis not present

## 2024-01-27 DIAGNOSIS — I739 Peripheral vascular disease, unspecified: Secondary | ICD-10-CM | POA: Diagnosis not present

## 2024-01-27 DIAGNOSIS — I2510 Atherosclerotic heart disease of native coronary artery without angina pectoris: Secondary | ICD-10-CM | POA: Diagnosis not present

## 2024-01-27 DIAGNOSIS — Z79899 Other long term (current) drug therapy: Secondary | ICD-10-CM

## 2024-01-28 LAB — COMPREHENSIVE METABOLIC PANEL WITH GFR
ALT: 14 IU/L (ref 0–44)
AST: 21 IU/L (ref 0–40)
Albumin: 3.7 g/dL — ABNORMAL LOW (ref 3.9–4.9)
Alkaline Phosphatase: 40 IU/L — ABNORMAL LOW (ref 44–121)
BUN/Creatinine Ratio: 12 (ref 10–24)
BUN: 57 mg/dL — ABNORMAL HIGH (ref 8–27)
Bilirubin Total: 0.2 mg/dL (ref 0.0–1.2)
CO2: 17 mmol/L — ABNORMAL LOW (ref 20–29)
Calcium: 8.3 mg/dL — ABNORMAL LOW (ref 8.6–10.2)
Chloride: 104 mmol/L (ref 96–106)
Creatinine, Ser: 4.58 mg/dL — ABNORMAL HIGH (ref 0.76–1.27)
Globulin, Total: 2.1 g/dL (ref 1.5–4.5)
Glucose: 87 mg/dL (ref 70–99)
Potassium: 4.4 mmol/L (ref 3.5–5.2)
Sodium: 141 mmol/L (ref 134–144)
Total Protein: 5.8 g/dL — ABNORMAL LOW (ref 6.0–8.5)
eGFR: 14 mL/min/1.73 — ABNORMAL LOW (ref >59)

## 2024-01-28 LAB — LIPID PANEL
Chol/HDL Ratio: 2.7 ratio (ref 0.0–5.0)
Cholesterol, Total: 92 mg/dL — ABNORMAL LOW (ref 100–199)
HDL: 34 mg/dL — ABNORMAL LOW (ref >39)
LDL Chol Calc (NIH): 38 mg/dL (ref 0–99)
Triglycerides: 105 mg/dL (ref 0–149)
VLDL Cholesterol Cal: 20 mg/dL (ref 5–40)

## 2024-01-28 LAB — CBC
Hematocrit: 35.4 % — ABNORMAL LOW (ref 37.5–51.0)
Hemoglobin: 10.6 g/dL — ABNORMAL LOW (ref 13.0–17.7)
MCH: 29.7 pg (ref 26.6–33.0)
MCHC: 29.9 g/dL — ABNORMAL LOW (ref 31.5–35.7)
MCV: 99 fL — ABNORMAL HIGH (ref 79–97)
Platelets: 127 x10E3/uL — ABNORMAL LOW (ref 150–450)
RBC: 3.57 x10E6/uL — ABNORMAL LOW (ref 4.14–5.80)
RDW: 13.7 % (ref 11.6–15.4)
WBC: 8.6 x10E3/uL (ref 3.4–10.8)

## 2024-01-29 DIAGNOSIS — Z87891 Personal history of nicotine dependence: Secondary | ICD-10-CM | POA: Diagnosis not present

## 2024-01-29 DIAGNOSIS — J984 Other disorders of lung: Secondary | ICD-10-CM | POA: Diagnosis not present

## 2024-01-29 DIAGNOSIS — Z01818 Encounter for other preprocedural examination: Secondary | ICD-10-CM | POA: Diagnosis not present

## 2024-01-30 ENCOUNTER — Ambulatory Visit: Payer: Self-pay | Admitting: Emergency Medicine

## 2024-01-30 ENCOUNTER — Telehealth: Payer: Self-pay | Admitting: Cardiovascular Disease

## 2024-01-30 DIAGNOSIS — Z992 Dependence on renal dialysis: Secondary | ICD-10-CM | POA: Diagnosis not present

## 2024-01-30 DIAGNOSIS — Z23 Encounter for immunization: Secondary | ICD-10-CM | POA: Diagnosis not present

## 2024-01-30 DIAGNOSIS — N2581 Secondary hyperparathyroidism of renal origin: Secondary | ICD-10-CM | POA: Diagnosis not present

## 2024-01-30 DIAGNOSIS — N186 End stage renal disease: Secondary | ICD-10-CM | POA: Diagnosis not present

## 2024-01-30 NOTE — Telephone Encounter (Signed)
  Patient has some concerns about his upcoming cath procedure. He would like to discuss. He has some questions regarding the contrast.

## 2024-01-30 NOTE — Telephone Encounter (Signed)
 Spoke with Dr. Darron, no plan for carotid artery intervention during cath. Patient is being followed by Dr. Pearline of vascular surgery, Wife will reach out to Vein and Vascular service to see if carotid artery disease should be addressed.

## 2024-01-30 NOTE — Telephone Encounter (Signed)
 Spoke with pt, he received a call from [facility] and told him they do not do out patient dialysis. He was scheduled for Monday but it has now been moved to Thursday. He is concerned because he was told the dye may cause him to stop urinating and he is not wanting that. He asked that we calkl his wife back as he is leaving to go to dialysis, her number is [phone]. They are also concerned because of carotid blockage they have bveen told about that maybe causing decreased 02 to his head. They report that was done at [facility]. Aware will forward to NP to review.

## 2024-01-30 NOTE — Telephone Encounter (Signed)
 I have called and spoke with the patient's wife.  I also spoke with our Cath Lab coordinator.  The current plan is to proceed with cardiac catheterization on Thursday and have his dialysis as outpatient on Friday.  If PCI is performed, he will need to be discharged Friday morning as his dialysis time on Friday is 12:30 PM.  Wife does have a question regarding the recent carotid ultrasound that was performed last week by [facility].  Apparently he has a greater than 70% carotid artery disease on the left, less than 50% carotid artery disease on the right.  They are asking if carotid intervention will be performed or during the same procedure, I informed the patient that Dr. Darron is very unlikely to perform carotid intervention during the same procedure's cardiac catheterization.  I am not confident if Dr. Darron still does carotid intervention at this time even if cardiac catheterization is not planned.  Will forward to Dr. Darron as RICK.

## 2024-02-01 DIAGNOSIS — Z992 Dependence on renal dialysis: Secondary | ICD-10-CM | POA: Diagnosis not present

## 2024-02-01 DIAGNOSIS — N186 End stage renal disease: Secondary | ICD-10-CM | POA: Diagnosis not present

## 2024-02-01 DIAGNOSIS — N2581 Secondary hyperparathyroidism of renal origin: Secondary | ICD-10-CM | POA: Diagnosis not present

## 2024-02-01 DIAGNOSIS — Z23 Encounter for immunization: Secondary | ICD-10-CM | POA: Diagnosis not present

## 2024-02-02 ENCOUNTER — Encounter (HOSPITAL_COMMUNITY): Payer: Self-pay | Admitting: Cardiovascular Disease

## 2024-02-02 ENCOUNTER — Other Ambulatory Visit: Payer: Self-pay

## 2024-02-02 ENCOUNTER — Ambulatory Visit (HOSPITAL_COMMUNITY): Admit: 2024-02-02 | Admitting: Cardiovascular Disease

## 2024-02-02 ENCOUNTER — Observation Stay (HOSPITAL_COMMUNITY)
Admission: AD | Admit: 2024-02-02 | Discharge: 2024-02-03 | Disposition: A | Discharge status: Home / Self Care | Attending: Cardiovascular Disease | Admitting: Cardiovascular Disease

## 2024-02-02 ENCOUNTER — Encounter (HOSPITAL_COMMUNITY): Payer: Self-pay

## 2024-02-02 ENCOUNTER — Encounter (HOSPITAL_COMMUNITY)
Admission: AD | Disposition: A | Discharge status: Home / Self Care | Payer: Self-pay | Source: Home / Self Care | Attending: Cardiovascular Disease

## 2024-02-02 DIAGNOSIS — Z880 Allergy status to penicillin: Secondary | ICD-10-CM | POA: Diagnosis not present

## 2024-02-02 DIAGNOSIS — Z992 Dependence on renal dialysis: Secondary | ICD-10-CM | POA: Diagnosis not present

## 2024-02-02 DIAGNOSIS — Z79899 Other long term (current) drug therapy: Secondary | ICD-10-CM | POA: Diagnosis not present

## 2024-02-02 DIAGNOSIS — Z9861 Coronary angioplasty status: Secondary | ICD-10-CM | POA: Insufficient documentation

## 2024-02-02 DIAGNOSIS — N186 End stage renal disease: Secondary | ICD-10-CM | POA: Diagnosis present

## 2024-02-02 DIAGNOSIS — I120 Hypertensive chronic kidney disease with stage 5 chronic kidney disease or end stage renal disease: Secondary | ICD-10-CM | POA: Diagnosis present

## 2024-02-02 DIAGNOSIS — Z832 Family history of diseases of the blood and blood-forming organs and certain disorders involving the immune mechanism: Secondary | ICD-10-CM | POA: Diagnosis not present

## 2024-02-02 DIAGNOSIS — J449 Chronic obstructive pulmonary disease, unspecified: Secondary | ICD-10-CM | POA: Diagnosis not present

## 2024-02-02 DIAGNOSIS — D631 Anemia in chronic kidney disease: Secondary | ICD-10-CM | POA: Diagnosis present

## 2024-02-02 DIAGNOSIS — Z87891 Personal history of nicotine dependence: Secondary | ICD-10-CM | POA: Diagnosis not present

## 2024-02-02 DIAGNOSIS — R7303 Prediabetes: Secondary | ICD-10-CM | POA: Diagnosis not present

## 2024-02-02 DIAGNOSIS — E785 Hyperlipidemia, unspecified: Secondary | ICD-10-CM | POA: Diagnosis not present

## 2024-02-02 DIAGNOSIS — R9439 Abnormal result of other cardiovascular function study: Secondary | ICD-10-CM | POA: Diagnosis not present

## 2024-02-02 DIAGNOSIS — Z7902 Long term (current) use of antithrombotics/antiplatelets: Secondary | ICD-10-CM

## 2024-02-02 DIAGNOSIS — Z801 Family history of malignant neoplasm of trachea, bronchus and lung: Secondary | ICD-10-CM | POA: Diagnosis not present

## 2024-02-02 DIAGNOSIS — I25118 Atherosclerotic heart disease of native coronary artery with other forms of angina pectoris: Secondary | ICD-10-CM | POA: Diagnosis not present

## 2024-02-02 DIAGNOSIS — Z7982 Long term (current) use of aspirin: Secondary | ICD-10-CM | POA: Diagnosis not present

## 2024-02-02 DIAGNOSIS — Z888 Allergy status to other drugs, medicaments and biological substances status: Secondary | ICD-10-CM

## 2024-02-02 DIAGNOSIS — I252 Old myocardial infarction: Secondary | ICD-10-CM

## 2024-02-02 DIAGNOSIS — I739 Peripheral vascular disease, unspecified: Secondary | ICD-10-CM | POA: Diagnosis present

## 2024-02-02 DIAGNOSIS — Z955 Presence of coronary angioplasty implant and graft: Secondary | ICD-10-CM

## 2024-02-02 DIAGNOSIS — Z8249 Family history of ischemic heart disease and other diseases of the circulatory system: Secondary | ICD-10-CM | POA: Diagnosis not present

## 2024-02-02 DIAGNOSIS — I2510 Atherosclerotic heart disease of native coronary artery without angina pectoris: Principal | ICD-10-CM | POA: Insufficient documentation

## 2024-02-02 DIAGNOSIS — Z7682 Awaiting organ transplant status: Secondary | ICD-10-CM | POA: Diagnosis not present

## 2024-02-02 DIAGNOSIS — Z96642 Presence of left artificial hip joint: Secondary | ICD-10-CM | POA: Diagnosis present

## 2024-02-02 DIAGNOSIS — Z01810 Encounter for preprocedural cardiovascular examination: Secondary | ICD-10-CM | POA: Diagnosis not present

## 2024-02-02 HISTORY — PX: LEFT HEART CATH AND CORONARY ANGIOGRAPHY: CATH118249

## 2024-02-02 HISTORY — PX: CORONARY PRESSURE/FFR STUDY: CATH118243

## 2024-02-02 HISTORY — PX: CORONARY LITHOTRIPSY: CATH118330

## 2024-02-02 HISTORY — DX: End stage renal disease: N18.6

## 2024-02-02 HISTORY — DX: Dependence on renal dialysis: Z99.2

## 2024-02-02 HISTORY — PX: CORONARY STENT INTERVENTION: CATH118234

## 2024-02-02 LAB — POCT ACTIVATED CLOTTING TIME
Activated Clotting Time: 268 s
Activated Clotting Time: 210 s
Activated Clotting Time: 458 s

## 2024-02-02 LAB — HEPATITIS B SURFACE ANTIGEN: Hepatitis B Surface Ag: NONREACTIVE

## 2024-02-02 LAB — MRSA NEXT GEN BY PCR, NASAL: MRSA by PCR Next Gen: NOT DETECTED

## 2024-02-02 SURGERY — LEFT HEART CATH AND CORONARY ANGIOGRAPHY
Anesthesia: LOCAL

## 2024-02-02 MED ORDER — PNEUMOCOCCAL 20-VAL CONJ VACC 0.5 ML IM SUSY
0.5000 mL | PREFILLED_SYRINGE | INTRAMUSCULAR | Status: AC
  Administered 2024-02-03: 0.5 mL via INTRAMUSCULAR
  Filled 2024-02-02: qty 0.5

## 2024-02-02 MED ORDER — ROSUVASTATIN CALCIUM 20 MG PO TABS
40.0000 mg | ORAL_TABLET | Freq: Every day | ORAL | Status: DC
  Administered 2024-02-02 – 2024-02-03 (×2): 40 mg via ORAL
  Filled 2024-02-02 (×2): qty 2

## 2024-02-02 MED ORDER — FENTANYL CITRATE (PF) 100 MCG/2ML IJ SOLN
INTRAMUSCULAR | Status: DC | PRN
  Administered 2024-02-02 (×4): 25 ug via INTRAVENOUS

## 2024-02-02 MED ORDER — ASPIRIN 81 MG PO CHEW: 81.0000 mg | CHEWABLE_TABLET | ORAL | Status: DC

## 2024-02-02 MED ORDER — TRIAMTERENE-HCTZ 37.5-25 MG PO TABS
1.0000 | ORAL_TABLET | Freq: Every day | ORAL | Status: DC
  Filled 2024-02-02: qty 1

## 2024-02-02 MED ORDER — ACETAMINOPHEN 325 MG PO TABS
650.0000 mg | ORAL_TABLET | ORAL | Status: DC | PRN
  Administered 2024-02-03: 650 mg via ORAL
  Filled 2024-02-02: qty 2

## 2024-02-02 MED ORDER — FLUTICASONE PROPIONATE 50 MCG/ACT NA SUSP: 2.0000 | Freq: Every day | NASAL | Status: DC | PRN

## 2024-02-02 MED ORDER — CHLORHEXIDINE GLUCONATE CLOTH 2 % EX PADS
6.0000 | MEDICATED_PAD | Freq: Every day | CUTANEOUS | Status: DC
  Administered 2024-02-03: 6 via TOPICAL

## 2024-02-02 MED ORDER — HEPARIN SODIUM (PORCINE) 1000 UNIT/ML DIALYSIS
1500.0000 [IU] | Freq: Once | INTRAMUSCULAR | Status: AC
  Administered 2024-02-02: 1500 [IU] via INTRAVENOUS_CENTRAL

## 2024-02-02 MED ORDER — ONDANSETRON HCL 4 MG/2ML IJ SOLN: 4.0000 mg | Freq: Four times a day (QID) | INTRAMUSCULAR | Status: DC | PRN

## 2024-02-02 MED ORDER — METOPROLOL SUCCINATE ER 100 MG PO TB24
100.0000 mg | ORAL_TABLET | Freq: Every day | ORAL | Status: DC
  Administered 2024-02-03: 100 mg via ORAL
  Filled 2024-02-02: qty 1

## 2024-02-02 MED ORDER — LIDOCAINE HCL (PF) 1 % IJ SOLN
INTRAMUSCULAR | Status: AC
Start: 2024-02-02 — End: 2024-02-02
  Filled 2024-02-02: qty 30

## 2024-02-02 MED ORDER — HYDROMORPHONE HCL 1 MG/ML IJ SOLN
INTRAMUSCULAR | Status: AC
Start: 2024-02-02 — End: 2024-02-02
  Filled 2024-02-02: qty 0.5

## 2024-02-02 MED ORDER — SEVELAMER CARBONATE 800 MG PO TABS
1600.0000 mg | ORAL_TABLET | Freq: Three times a day (TID) | ORAL | Status: DC
  Administered 2024-02-03: 1600 mg via ORAL
  Filled 2024-02-02: qty 2

## 2024-02-02 MED ORDER — CLOPIDOGREL BISULFATE 75 MG PO TABS
75.0000 mg | ORAL_TABLET | Freq: Every day | ORAL | Status: DC
  Administered 2024-02-03: 75 mg via ORAL
  Filled 2024-02-02: qty 1

## 2024-02-02 MED ORDER — FENTANYL CITRATE (PF) 100 MCG/2ML IJ SOLN
INTRAMUSCULAR | Status: AC
Start: 2024-02-02 — End: 2024-02-02
  Filled 2024-02-02: qty 2

## 2024-02-02 MED ORDER — SODIUM CHLORIDE 0.9% FLUSH: 3.0000 mL | INTRAVENOUS | Status: DC | PRN

## 2024-02-02 MED ORDER — FREE WATER
250.0000 mL | Freq: Once | Status: AC
  Administered 2024-02-02: 250 mL via ORAL

## 2024-02-02 MED ORDER — LIDOCAINE HCL (PF) 1 % IJ SOLN
INTRAMUSCULAR | Status: DC | PRN
  Administered 2024-02-02: 5 mL

## 2024-02-02 MED ORDER — SODIUM CHLORIDE 0.9% FLUSH: 3.0000 mL | Freq: Two times a day (BID) | INTRAVENOUS | Status: DC

## 2024-02-02 MED ORDER — IOHEXOL 350 MG/ML SOLN
INTRAVENOUS | Status: DC | PRN
  Administered 2024-02-02: 250 mL

## 2024-02-02 MED ORDER — HEPARIN SODIUM (PORCINE) 1000 UNIT/ML IJ SOLN
INTRAMUSCULAR | Status: DC | PRN
  Administered 2024-02-02: 10000 [IU] via INTRAVENOUS
  Administered 2024-02-02: 2000 [IU] via INTRAVENOUS

## 2024-02-02 MED ORDER — HEPARIN SODIUM (PORCINE) 1000 UNIT/ML IJ SOLN
INTRAMUSCULAR | Status: AC
Start: 2024-02-02 — End: 2024-02-02
  Filled 2024-02-02: qty 10

## 2024-02-02 MED ORDER — HEPARIN SODIUM (PORCINE) 1000 UNIT/ML IJ SOLN
INTRAMUSCULAR | Status: AC
  Filled 2024-02-02: qty 10

## 2024-02-02 MED ORDER — FENOFIBRATE 160 MG PO TABS
160.0000 mg | ORAL_TABLET | Freq: Every day | ORAL | Status: DC
  Administered 2024-02-02 – 2024-02-03 (×2): 160 mg via ORAL
  Filled 2024-02-02 (×2): qty 1

## 2024-02-02 MED ORDER — CILOSTAZOL 100 MG PO TABS
100.0000 mg | ORAL_TABLET | Freq: Two times a day (BID) | ORAL | Status: DC
  Administered 2024-02-02 – 2024-02-03 (×2): 100 mg via ORAL
  Filled 2024-02-02 (×2): qty 1

## 2024-02-02 MED ORDER — HEPARIN (PORCINE) IN NACL 1000-0.9 UT/500ML-% IV SOLN
INTRAVENOUS | Status: DC | PRN
  Administered 2024-02-02: 1000 mL

## 2024-02-02 MED ORDER — HEPARIN SODIUM (PORCINE) 1000 UNIT/ML IJ SOLN
INTRAMUSCULAR | Status: AC
  Filled 2024-02-02: qty 1

## 2024-02-02 MED ORDER — CLOPIDOGREL BISULFATE 300 MG PO TABS
ORAL_TABLET | ORAL | Status: AC
  Filled 2024-02-02: qty 2

## 2024-02-02 MED ORDER — SODIUM CHLORIDE 0.9 % IV SOLN: 250.0000 mL | INTRAVENOUS | Status: DC | PRN

## 2024-02-02 MED ORDER — ALBUTEROL SULFATE (2.5 MG/3ML) 0.083% IN NEBU: 3.0000 mL | INHALATION_SOLUTION | Freq: Four times a day (QID) | RESPIRATORY_TRACT | Status: DC | PRN

## 2024-02-02 MED ORDER — LIDOCAINE-PRILOCAINE 2.5-2.5 % EX CREA: 1.0000 | TOPICAL_CREAM | CUTANEOUS | Status: DC

## 2024-02-02 MED ORDER — VITAMIN D (ERGOCALCIFEROL) 1.25 MG (50000 UNIT) PO CAPS
50000.0000 [IU] | ORAL_CAPSULE | ORAL | Status: DC
  Administered 2024-02-03: 50000 [IU] via ORAL
  Filled 2024-02-02: qty 1

## 2024-02-02 MED ORDER — SODIUM CHLORIDE 0.9% FLUSH
3.0000 mL | Freq: Two times a day (BID) | INTRAVENOUS | Status: DC
  Administered 2024-02-02 – 2024-02-03 (×3): 3 mL via INTRAVENOUS

## 2024-02-02 MED ORDER — CLOPIDOGREL BISULFATE 300 MG PO TABS
ORAL_TABLET | ORAL | Status: DC | PRN
  Administered 2024-02-02: 600 mg via ORAL

## 2024-02-02 MED ORDER — NITROGLYCERIN 0.4 MG SL SUBL: 0.4000 mg | SUBLINGUAL_TABLET | SUBLINGUAL | Status: DC | PRN

## 2024-02-02 MED ORDER — MIDAZOLAM HCL 2 MG/2ML IJ SOLN
INTRAMUSCULAR | Status: AC
  Filled 2024-02-02: qty 2

## 2024-02-02 MED ORDER — SODIUM CHLORIDE 0.9 % IV SOLN
INTRAVENOUS | Status: DC | PRN
  Administered 2024-02-02: 10 mL/h via INTRAVENOUS

## 2024-02-02 MED ORDER — MIDAZOLAM HCL 2 MG/2ML IJ SOLN
INTRAMUSCULAR | Status: DC | PRN
Start: 2024-02-02 — End: 2024-02-02
  Administered 2024-02-02 (×2): 1 mg via INTRAVENOUS

## 2024-02-02 MED ORDER — ASPIRIN 81 MG PO TBEC
81.0000 mg | DELAYED_RELEASE_TABLET | Freq: Every day | ORAL | Status: DC
  Administered 2024-02-03: 81 mg via ORAL
  Filled 2024-02-02: qty 1

## 2024-02-02 MED ORDER — ACETAMINOPHEN 325 MG PO TABS
ORAL_TABLET | ORAL | Status: AC
  Administered 2024-02-02: 650 mg
  Filled 2024-02-02: qty 2

## 2024-02-02 SURGICAL SUPPLY — 20 items
BALLOON EMERGE MR 2.75X12 (BALLOONS) IMPLANT
BALLOON SAPPHIRE NC24 3.5X12 (BALLOONS) IMPLANT
CATH INFINITI 5FR MULTPACK ANG (CATHETERS) IMPLANT
CATH SHOCKWAVE C2 3.0X12 (CATHETERS) IMPLANT
CATH VISTA GUIDE 6FR XB3.5 EPK (CATHETERS) IMPLANT
DEVICE CLOSURE MYNXGRIP 6/7F (Vascular Products) IMPLANT
GLIDESHEATH SLEND SS 6F .021 (SHEATH) IMPLANT
GUIDEWIRE INQWIRE 1.5J.035X260 (WIRE) IMPLANT
GUIDEWIRE PRESSURE X 175 (WIRE) IMPLANT
KIT ENCORE 26 ADVANTAGE (KITS) IMPLANT
KIT MICROPUNCTURE NIT STIFF (SHEATH) IMPLANT
PACK CARDIAC CATHETERIZATION (CUSTOM PROCEDURE TRAY) ×1 IMPLANT
SET ATX-X65L (MISCELLANEOUS) IMPLANT
SHEATH PINNACLE 5F 10CM (SHEATH) IMPLANT
SHEATH PINNACLE 6F 10CM (SHEATH) IMPLANT
SHEATH PROBE COVER 6X72 (BAG) IMPLANT
STENT SYNERGY XD 3.0X16 (Permanent Stent) IMPLANT
TUBING CIL FLEX 10 FLL-RA (TUBING) IMPLANT
WIRE EMERALD 3MM-J .035X150CM (WIRE) IMPLANT
WIRE RUNTHROUGH .014X180CM (WIRE) IMPLANT

## 2024-02-02 NOTE — Plan of Care (Signed)
  Problem: Activity: Goal: Ability to return to baseline activity level will improve Outcome: Progressing   Problem: Cardiovascular: Goal: Ability to achieve and maintain adequate cardiovascular perfusion will improve Outcome: Progressing Goal: Vascular access site(s) Level 0-1 will be maintained Outcome: Progressing   Problem: Health Behavior/Discharge Planning: Goal: Ability to safely manage health-related needs after discharge will improve Outcome: Progressing   Problem: Education: Goal: Knowledge of General Education information will improve Description: Including pain rating scale, medication(s)/side effects and non-pharmacologic comfort measures Outcome: Progressing   Problem: Clinical Measurements: Goal: Will remain free from infection Outcome: Progressing

## 2024-02-02 NOTE — Interval H&P Note (Signed)
 History and Physical Interval Note:  02/02/2024 8:40 AM  David Cowan  has presented today for surgery, with the diagnosis of positive stress - cad.  The various methods of treatment have been discussed with the patient and family. After consideration of risks, benefits and other options for treatment, the patient has consented to  Procedure(s): LEFT HEART CATH AND CORONARY ANGIOGRAPHY (N/A) as a surgical intervention.  The patient's history has been reviewed, patient examined, no change in status, stable for surgery.  I have reviewed the patient's chart and labs.  Questions were answered to the patient's satisfaction.     Darlinda Bellows

## 2024-02-02 NOTE — Consult Note (Signed)
 Renal Service Consult Note [facility] David JONETTA Fret, MD  Patient: David Cowan Date: 02/02/2024 Requesting Physician: Dr. Darron  Reason for Consult: ESRD pt w/ CAD s/p LHC HPI: The patient is a 63 y.o. year-old w/ PMH as below who having a +positive outpt cardiac stress test is admitted for elective LHC. Pt went for LHC  this am and had 3V CAD, they did intervention on the mid L Cx lesion w/ IV lithotripsy and stent placement. Post procedure pt is pleasant and stable. He did get a lot of IV contrast due to the nature of the procedure. We are asked to see for dialysis.    Pt seen in room. He is w/o c/o's, no SOB, CP or LE edema. Last HD was yesterday at OP unit.    ROS - denies CP, no joint pain, no HA, no blurry vision, no rash, no diarrhea, no nausea/ vomiting   Past Medical History  Past Medical History:  Diagnosis Date   Arthritis    Bronchitis    CAD (coronary artery disease)    COPD (chronic obstructive pulmonary disease) (HCC)    mild   Dyslipidemia    ESRD on hemodialysis (HCC)    Hand pain    occasional in right hand   Hard of hearing    left ear, no hearing aid   History of shingles    Hyperlipidemia    Hypertension    Myocardial infarction [facility]) 2002   Right flank pain    Past Surgical History  Past Surgical History:  Procedure Laterality Date   AV FISTULA PLACEMENT Right 08/30/2023   Procedure: ARTERIOVENOUS (AV) FISTULA CREATION;  Surgeon: Pearline Norman RAMAN, MD;  Location: [facility] OR;  Service: Vascular;  Laterality: Right;   CARDIAC CATHETERIZATION  07/21/2000   COLONOSCOPY     RENAL BIOPSY     TOTAL HIP ARTHROPLASTY Left 12/10/2014   Procedure: LEFT TOTAL HIP ARTHROPLASTY ANTERIOR APPROACH;  Surgeon: Donnice Car, MD;  Location: [facility] ORS;  Service: Orthopedics;  Laterality: Left;   VASECTOMY     Family History  Family History  Problem Relation Age of Onset   Congestive Heart Failure Mother 60   Lung cancer Mother    Heart attack Father  84   Factor V Leiden deficiency Sister    Factor V Leiden deficiency Brother    Coronary artery disease Other         fam hx, early   Heart attack Other 37       uncle   Colon cancer Neg Hx    Social History  reports that he quit smoking about 20 months ago. His smoking use included cigarettes. He started smoking about 50 years ago. He has a 49 pack-year smoking history. He has never used smokeless tobacco. He reports that he does not currently use alcohol after a past usage of about 3.0 - 4.0 standard drinks of alcohol per week. He reports that he does not currently use drugs after having used the following drugs: Marijuana. Allergies  Allergies  Allergen Reactions   Cucumber Extract Anaphylaxis and Other (See Comments)    Black out   Amoxicillin Rash   Penicillins Rash   Home medications Prior to Admission medications   Medication Sig Start Date End Date Taking? Authorizing Provider  albuterol  (VENTOLIN  HFA) 108 (90 Base) MCG/ACT inhaler Inhale 2 puffs into the lungs every 6 (six) hours as needed for wheezing or shortness of breath. 03/11/22  Yes Wendee Lynwood HERO, NP  aspirin  81 MG tablet Take 1 tablet (81 mg total) by mouth daily. 03/21/18  Yes Darron Deatrice LABOR, MD  cetirizine (ZYRTEC) 10 MG tablet Take 10 mg by mouth daily as needed for allergies.   Yes [provider]  cilostazol  (PLETAL ) 100 MG tablet TAKE 1 TABLET BY MOUTH TWICE A DAY 01/05/24  Yes Darron Deatrice LABOR, MD  Evolocumab  (REPATHA  SURECLICK) 140 MG/ML SOAJ INJECT 140 MG INTO THE SKIN EVERY 14 (FOURTEEN) DAYS. 07/19/23  Yes Darron Deatrice LABOR, MD  fenofibrate  160 MG tablet Take 1 tablet (160 mg total) by mouth daily. 01/05/24  Yes Darron Deatrice LABOR, MD  fluticasone  (FLONASE ) 50 MCG/ACT nasal spray Place 2 sprays into both nostrils at bedtime. Patient taking differently: Place 2 sprays into both nostrils daily as needed for allergies or rhinitis. 03/02/22  Yes Dugal, Tabitha, FNP  furosemide (LASIX) 40 MG tablet Take 40  mg by mouth Every Tuesday,Thursday,and Saturday with dialysis.   Yes [provider]  lidocaine -prilocaine  (EMLA ) cream Apply 1 Application topically every Monday, Wednesday, and Friday with hemodialysis.   Yes [provider]  metoprolol  succinate (TOPROL -XL) 100 MG 24 hr tablet TAKE 1 TABLET BY MOUTH AT  BEDTIME. TAKE WITH OR  IMMEDIATELY FOLLOWING A  MEAL. 01/05/24  Yes Darron Deatrice LABOR, MD  nitroGLYCERIN  (NITROSTAT ) 0.4 MG SL tablet Place 1 tablet (0.4 mg total) under the tongue every 5 (five) minutes as needed for chest pain. 01/02/24  Yes Darron Deatrice LABOR, MD  rosuvastatin  (CRESTOR ) 40 MG tablet Take 1 tablet (40 mg total) by mouth daily. 01/05/24  Yes Darron Deatrice LABOR, MD  sevelamer  carbonate (RENVELA ) 800 MG tablet Take 1,600 mg by mouth 3 (three) times daily with meals. 11/09/23 11/08/24 Yes [provider]  triamterene -hydrochlorothiazide  (MAXZIDE -25) 37.5-25 MG tablet Take 1 tablet by mouth daily. 01/05/24  Yes Darron Deatrice LABOR, MD  Vitamin D , Ergocalciferol , (DRISDOL ) 1.25 MG (50000 UNIT) CAPS capsule Take 1 capsule (50,000 Units total) by mouth every 7 (seven) days. 11/12/22  Yes Wendee Lynwood HERO, NP     Vitals:   02/02/24 1315 02/02/24 1330 02/02/24 1400 02/02/24 1537  BP: (!) 147/69 (!) 146/70 (!) 160/79 (!) 117/59  Pulse: 69 72 78 81  Resp: 11 19 17 20   Temp:    98.3 F (36.8 C)  TempSrc:    Oral  SpO2: 98% 95% 94% 96%  Weight:      Height:       Exam Gen alert, no distress No rash, cyanosis or gangrene Sclera anicteric, throat clear  No jvd or bruits Chest clear bilat to bases, no rales/ wheezing RRR no MRG Abd soft ntnd no mass or ascites +bs GU deferred MS no joint effusions or deformity Ext no LE or UE edema, no other edema Neuro is alert, Ox 3 , nf    RFA aVF+bruit   Home bp meds: Lasix 40 every day Toprol  xl 100mg  every day    [facility] 4h  B400   106kg   2K bath   AVF   Heparin  3000 + 1000 midrun Last HD 8/20, post wt  106.9kg   Recently raised his dry wt IDWG usually 2-4 kg  Assessment/ Plan: Positive cardiac stress test: hx NSTEMI in 2002. Went for [facility] today showing 3V CAD. The LCx lesion was rx'd w/ IV lithotripsy and DES placement.  ESRD: on HD MWF. Last HD Wed. Plan HD tonight if possible, LVEDP was up and he also got 250 cc contrast and  is a relatively new ESRD pt.  HTN: on BB at home and lasix. Continue here prn.  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, follow   Myer Fret  MD [facility] 02/02/2024, 3:41 PM  Recent Labs  Lab 01/27/24 0841 01/27/24 1010  HGB 10.6*  --   ALBUMIN  --  3.7*  CALCIUM   --  8.3*  CREATININE  --  4.58*  K  --  4.4   Inpatient medications:  [START ON 02/03/2024] aspirin  EC  81 mg Oral Daily   cilostazol   100 mg Oral BID   [START ON 02/03/2024] clopidogrel   75 mg Oral Q breakfast   fenofibrate   160 mg Oral Daily   [START ON 02/03/2024] metoprolol  succinate  100 mg Oral Daily   [START ON 02/03/2024] pneumococcal 20-valent conjugate vaccine  0.5 mL Intramuscular Tomorrow-1000   rosuvastatin   40 mg Oral Daily   sevelamer  carbonate  1,600 mg Oral TID WC   sodium chloride  flush  3 mL Intravenous Q12H   [START ON 02/03/2024] triamterene -hydrochlorothiazide   1 tablet Oral Daily   [START ON 02/03/2024] Vitamin D  (Ergocalciferol )  50,000 Units Oral Q7 days    sodium chloride      sodium chloride , acetaminophen , albuterol , fluticasone , nitroGLYCERIN , ondansetron  (ZOFRAN ) IV, sodium chloride  flush

## 2024-02-02 NOTE — Progress Notes (Signed)
 Pt receives out-pt HD at [facility] on MWF 12:30 pm chair time. Will assist as needed.   Randine Mungo Dialysis Navigator [phone]

## 2024-02-02 NOTE — Plan of Care (Signed)

## 2024-02-03 ENCOUNTER — Other Ambulatory Visit (HOSPITAL_COMMUNITY): Payer: Self-pay

## 2024-02-03 DIAGNOSIS — I120 Hypertensive chronic kidney disease with stage 5 chronic kidney disease or end stage renal disease: Secondary | ICD-10-CM | POA: Diagnosis not present

## 2024-02-03 DIAGNOSIS — Z79899 Other long term (current) drug therapy: Secondary | ICD-10-CM | POA: Diagnosis not present

## 2024-02-03 DIAGNOSIS — R9439 Abnormal result of other cardiovascular function study: Secondary | ICD-10-CM | POA: Diagnosis not present

## 2024-02-03 DIAGNOSIS — Z7982 Long term (current) use of aspirin: Secondary | ICD-10-CM | POA: Diagnosis not present

## 2024-02-03 DIAGNOSIS — I25118 Atherosclerotic heart disease of native coronary artery with other forms of angina pectoris: Secondary | ICD-10-CM | POA: Diagnosis not present

## 2024-02-03 DIAGNOSIS — D631 Anemia in chronic kidney disease: Secondary | ICD-10-CM | POA: Diagnosis not present

## 2024-02-03 DIAGNOSIS — I2510 Atherosclerotic heart disease of native coronary artery without angina pectoris: Secondary | ICD-10-CM | POA: Diagnosis not present

## 2024-02-03 DIAGNOSIS — Z01810 Encounter for preprocedural cardiovascular examination: Secondary | ICD-10-CM | POA: Diagnosis not present

## 2024-02-03 DIAGNOSIS — Z7682 Awaiting organ transplant status: Secondary | ICD-10-CM | POA: Diagnosis not present

## 2024-02-03 DIAGNOSIS — J449 Chronic obstructive pulmonary disease, unspecified: Secondary | ICD-10-CM | POA: Diagnosis not present

## 2024-02-03 DIAGNOSIS — Z87891 Personal history of nicotine dependence: Secondary | ICD-10-CM | POA: Diagnosis not present

## 2024-02-03 DIAGNOSIS — Z9861 Coronary angioplasty status: Secondary | ICD-10-CM | POA: Diagnosis not present

## 2024-02-03 DIAGNOSIS — Z992 Dependence on renal dialysis: Secondary | ICD-10-CM | POA: Diagnosis not present

## 2024-02-03 DIAGNOSIS — N186 End stage renal disease: Secondary | ICD-10-CM | POA: Diagnosis not present

## 2024-02-03 DIAGNOSIS — I739 Peripheral vascular disease, unspecified: Secondary | ICD-10-CM | POA: Diagnosis not present

## 2024-02-03 DIAGNOSIS — E785 Hyperlipidemia, unspecified: Secondary | ICD-10-CM | POA: Diagnosis not present

## 2024-02-03 LAB — BASIC METABOLIC PANEL WITH GFR
Anion gap: 11 (ref 5–15)
BUN: 26 mg/dL — ABNORMAL HIGH (ref 8–23)
CO2: 28 mmol/L (ref 22–32)
Calcium: 8.7 mg/dL — ABNORMAL LOW (ref 8.9–10.3)
Chloride: 95 mmol/L — ABNORMAL LOW (ref 98–111)
Creatinine, Ser: 3.65 mg/dL — ABNORMAL HIGH (ref 0.61–1.24)
GFR, Estimated: 18 mL/min — ABNORMAL LOW (ref >60)
Glucose, Bld: 145 mg/dL — ABNORMAL HIGH (ref 70–99)
Potassium: 3.5 mmol/L (ref 3.5–5.1)
Sodium: 134 mmol/L — ABNORMAL LOW (ref 135–145)

## 2024-02-03 LAB — CBC
HCT: 32.8 % — ABNORMAL LOW (ref 39.0–52.0)
Hemoglobin: 10.6 g/dL — ABNORMAL LOW (ref 13.0–17.0)
MCH: 30.6 pg (ref 26.0–34.0)
MCHC: 32.3 g/dL (ref 30.0–36.0)
MCV: 94.8 fL (ref 80.0–100.0)
Platelets: 152 K/uL (ref 150–400)
RBC: 3.46 MIL/uL — ABNORMAL LOW (ref 4.22–5.81)
RDW: 14.2 % (ref 11.5–15.5)
WBC: 10.8 K/uL — ABNORMAL HIGH (ref 4.0–10.5)
nRBC: 0 % (ref 0.0–0.2)

## 2024-02-03 LAB — HEPATITIS B SURFACE ANTIBODY, QUANTITATIVE: Hep B S AB Quant (Post): <3.5 m[IU]/mL — ABNORMAL LOW

## 2024-02-03 MED ORDER — CLOPIDOGREL BISULFATE 75 MG PO TABS
75.0000 mg | ORAL_TABLET | Freq: Every day | ORAL | 3 refills | Status: AC
  Filled 2024-02-03 – 2024-05-02 (×2): qty 90, 90d supply, fill #0

## 2024-02-03 NOTE — Discharge Planning (Signed)
 [facility] Patient Discharge Orders - [facility] CLINIC: [place]  Patient's name: David Cowan Admit/DC Dates: 02/02/2024 - 02/03/24  DISCHARGE DIAGNOSES: Postive cardiac stress test: [facility] for LHC on 02/02/24 and it showed 3 vessel CAD. Lcx lesion was rx'd with IV lithotripsy and DES placement   ESRD on HD  HD ORDER CHANGES: Heparin  change: hold heparin  for 1 week due to invasive procedure EDW Change: no  Bath Change: no  ANEMIA MANAGEMENT: Aranesp: Given: no    ESA dose for discharge: mircera 50 mcg due on 02/06/24 IV Iron dose at discharge: per protocol Transfusion: Given: no   BONE/MINERAL MEDICATIONS: Hectorol/Calcitriol change: continue 0.5 mcg Calcitriol Sensipar/Parsabiv change: per protocol  ACCESS INTERVENTION/CHANGE: no change Details:   RECENT LABS: Recent Labs  Lab 02/03/24 0339  HGB 10.6*  NA 134*  K 3.5  CALCIUM  8.7*    IV ANTIBIOTICS: no Details:   OTHER/APPTS/LAB ORDERS: Please draw updated labs at next visit.  D/C Meds to be reconciled by nurse after every discharge.  Completed By: Belvie Och, NP   Reviewed by: MD:______ RN_______

## 2024-02-03 NOTE — Progress Notes (Signed)
 Went over AVS with patient/spouse, Iv's removed, TOC med given, patient belongings gathered and patient was wheeled out to wife's vehicle.

## 2024-02-03 NOTE — Progress Notes (Signed)
  [facility] Progress Note   Subjective:   Patient seen and examined in his room. His wife and daughter were visiting with him this morning. He reports that he feels great and is excited about getting discharge later today. BP stable. His last HD was 02/02/24  Objective Vitals:   02/03/24 0500 02/03/24 0525 02/03/24 0805 02/03/24 0836  BP:  110/62 120/65 120/65  Pulse:  77 81 81  Resp:  18 17   Temp:  97.6 F (36.4 C) 98 F (36.7 C)   TempSrc:  Oral Oral   SpO2:  95% 94%   Weight: 105.8 kg     Height:       Exam Gen alert, no distress No rash, cyanosis or gangrene Sclera anicteric, throat clear  No jvd or bruits Chest clear bilat to bases, no rales/ wheezing RRR no MRG Abd soft ntnd no mass or ascites +bs GU deferred MS no joint effusions or deformity Ext no LE or UE edema, no other edema Neuro is alert, Ox 3 , nf    RFA aVF+bruit   Additional Objective Labs: Basic Metabolic Panel: Recent Labs  Lab 02/03/24 0339  NA 134*  K 3.5  CL 95*  CO2 28  GLUCOSE 145*  BUN 26*  CREATININE 3.65*  CALCIUM  8.7*    CBC: Recent Labs  Lab 02/03/24 0339  WBC 10.8*  HGB 10.6*  HCT 32.8*  MCV 94.8  PLT 152   Medications:  sodium chloride       aspirin  EC  81 mg Oral Daily   Chlorhexidine  Gluconate Cloth  6 each Topical Q0600   cilostazol   100 mg Oral BID   clopidogrel   75 mg Oral Q breakfast   fenofibrate   160 mg Oral Daily   metoprolol  succinate  100 mg Oral Daily   rosuvastatin   40 mg Oral Daily   sevelamer  carbonate  1,600 mg Oral TID WC   sodium chloride  flush  3 mL Intravenous Q12H   triamterene -hydrochlorothiazide   1 tablet Oral Daily   Vitamin D  (Ergocalciferol )  50,000 Units Oral Q7 days   Home bp meds: Lasix 40 every day Toprol  xl 100mg  every day      [facility] 4h  B400   106kg   2K bath   AVF   Heparin  3000 + 1000 midrun Last HD 8/20, post wt 106.9kg   Recently raised his dry wt IDWG usually 2-4 kg   Assessment/  Plan: Positive cardiac stress test: hx NSTEMI in 2002. Went for [facility] today showing 3V CAD. The LCx lesion was rx'd w/ IV lithotripsy and DES placement.  ESRD: on HD MWF. Last HD Wed. Had HD 02/03/24, LVEDP was up and he also got 250 cc contrast and is a relatively new ESRD pt.  HTN: on BB at home and lasix. Continue here prn.  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

## 2024-02-03 NOTE — Progress Notes (Signed)
 Rounding Note   Patient Name: David Cowan Date of Encounter: 02/03/2024  [facility] Cardiologist: Danelle Birmingham, MD   Subjective Postop day #1 circumflex PCI drug-eluting stenting.  Groin stable.  Denies chest pain or shortness of breath.  Scheduled Meds:  aspirin  EC  81 mg Oral Daily   Chlorhexidine  Gluconate Cloth  6 each Topical Q0600   cilostazol   100 mg Oral BID   clopidogrel   75 mg Oral Q breakfast   fenofibrate   160 mg Oral Daily   metoprolol  succinate  100 mg Oral Daily   pneumococcal 20-valent conjugate vaccine  0.5 mL Intramuscular Tomorrow-1000   rosuvastatin   40 mg Oral Daily   sevelamer  carbonate  1,600 mg Oral TID WC   sodium chloride  flush  3 mL Intravenous Q12H   triamterene -hydrochlorothiazide   1 tablet Oral Daily   Vitamin D  (Ergocalciferol )  50,000 Units Oral Q7 days   Continuous Infusions:  sodium chloride      PRN Meds: sodium chloride , acetaminophen , albuterol , fluticasone , nitroGLYCERIN , ondansetron  (ZOFRAN ) IV, sodium chloride  flush   Vital Signs  Vitals:   02/02/24 2316 02/03/24 0500 02/03/24 0525 02/03/24 0805  BP: 103/67  110/62 120/65  Pulse: 94  77 81  Resp: 15  18 17   Temp: 98.1 F (36.7 C)  97.6 F (36.4 C) 98 F (36.7 C)  TempSrc: Oral  Oral Oral  SpO2: 94%  95% 94%  Weight:  105.8 kg    Height:        Intake/Output Summary (Last 24 hours) at 02/03/2024 0835 Last data filed at 02/02/2024 2300 Gross per 24 hour  Intake 730 ml  Output 2500 ml  Net -1770 ml      02/03/2024    5:00 AM 02/02/2024   10:30 PM 02/02/2024    7:18 PM  Last 3 Weights  Weight (lbs) 233 lb 4 oz 243 lb 13.3 oz 245 lb 6 oz  Weight (kg) 105.8 kg 110.6 kg 111.3 kg      Telemetry Sinus rhythm with occasional PVCs- Personally Reviewed  ECG  Normal sinus rhythm at 67 without ST or T wave changes- Personally Reviewed  Physical Exam  GEN: No acute distress.   Neck: No JVD Cardiac: RRR, no murmurs, rubs, or gallops.  Respiratory: Clear to  auscultation bilaterally. GI: Soft, nontender, non-distended  MS: No edema; No deformity. Neuro:  Nonfocal  Psych: Normal affect  Groin-stable  Labs High Sensitivity Troponin:  No results for input(s): TROPONINIHS in the last 720 hours.   Chemistry Recent Labs  Lab 01/27/24 1010 02/03/24 0339  NA 141 134*  K 4.4 3.5  CL 104 95*  CO2 17* 28  GLUCOSE 87 145*  BUN 57* 26*  CREATININE 4.58* 3.65*  CALCIUM  8.3* 8.7*  PROT 5.8*  --   ALBUMIN 3.7*  --   AST 21  --   ALT 14  --   ALKPHOS 40*  --   BILITOT 0.2  --   GFRNONAA  --  18*  ANIONGAP  --  11    Lipids  Recent Labs  Lab 01/27/24 1010  CHOL 92*  TRIG 105  HDL 34*  LABVLDL 20  LDLCALC 38  CHOLHDL 2.7    Hematology Recent Labs  Lab 01/27/24 0841 02/03/24 0339  WBC 8.6 10.8*  RBC 3.57* 3.46*  HGB 10.6* 10.6*  HCT 35.4* 32.8*  MCV 99* 94.8  MCH 29.7 30.6  MCHC 29.9* 32.3  RDW 13.7 14.2  PLT 127* 152   Thyroid  No  results for input(s): TSH, FREET4 in the last 168 hours.  BNPNo results for input(s): BNP, PROBNP in the last 168 hours.  DDimer No results for input(s): DDIMER in the last 168 hours.   Radiology  CARDIAC CATHETERIZATION Result Date: 02/02/2024   Mid Cx lesion is 80% stenosed.   LPAV lesion is 40% stenosed.   1st Diag lesion is 100% stenosed.   Mid LAD lesion is 30% stenosed.   1st Mrg lesion is 90% stenosed.   Prox RCA to Mid RCA lesion is 95% stenosed.   A drug-eluting stent was successfully placed using a STENT SYNERGY XD 3.0X16.   Post intervention, there is a 0% residual stenosis.   The left ventricular systolic function is normal.   LV end diastolic pressure is moderately elevated.   The left ventricular ejection fraction is 55-65% by visual estimate.   In the absence of any other complications or medical issues, we expect the patient to be ready for discharge from an interventional cardiology perspective on 02/03/2024.   Recommend uninterrupted dual antiplatelet therapy with Aspirin   81mg  daily and Clopidogrel  75mg  daily for a minimum of 6 months (stable ischemic heart disease-Class I recommendation). 1.  Left dominant coronary arteries with significant three-vessel coronary artery disease.  The LAD is free of significant disease but the first diagonal is known to be chronically occluded.  The RCA is small and nondominant with subtotal occlusion in the midsegment.  There is 80% calcified stenosis in the mid left circumflex which was highly significant by pressure gradient with an RFR of 0.6.  Diffusely diseased OM1 but very small in size. 2.  Normal left ventricular systolic function with moderately elevated left ventricular end-diastolic pressure at 26 mmHg. 3.  Successful intravascular lithotripsy and drug-eluting stent placement to the mid left circumflex. Recommendations: The procedure required 250 mL of contrast due to calcifications and difficult visualization.  Due to that and given elevated LVEDP to 26 mmHg, recommend dialysis before discharge.  Will observe the patient overnight.    Cardiac Studies  Cardiac catheterization/PCI and stent (02/02/2024)  Conclusion      Mid Cx lesion is 80% stenosed.   LPAV lesion is 40% stenosed.   1st Diag lesion is 100% stenosed.   Mid LAD lesion is 30% stenosed.   1st Mrg lesion is 90% stenosed.   Prox RCA to Mid RCA lesion is 95% stenosed.   A drug-eluting stent was successfully placed using a STENT SYNERGY XD 3.0X16.   Post intervention, there is a 0% residual stenosis.   The left ventricular systolic function is normal.   LV end diastolic pressure is moderately elevated.   The left ventricular ejection fraction is 55-65% by visual estimate.   In the absence of any other complications or medical issues, we expect the patient to be ready for discharge from an interventional cardiology perspective on 02/03/2024.   Recommend uninterrupted dual antiplatelet therapy with Aspirin  81mg  daily and Clopidogrel  75mg  daily for a minimum of 6  months (stable ischemic heart disease-Class I recommendation).   1.  Left dominant coronary arteries with significant three-vessel coronary artery disease.  The LAD is free of significant disease but the first diagonal is known to be chronically occluded.  The RCA is small and nondominant with subtotal occlusion in the midsegment.  There is 80% calcified stenosis in the mid left circumflex which was highly significant by pressure gradient with an RFR of 0.6.  Diffusely diseased OM1 but very small in size. 2.  Normal  left ventricular systolic function with moderately elevated left ventricular end-diastolic pressure at 26 mmHg. 3.  Successful intravascular lithotripsy and drug-eluting stent placement to the mid left circumflex.   Recommendations: The procedure required 250 mL of contrast due to calcifications and difficult visualization.  Due to that and given elevated LVEDP to 26 mmHg, recommend dialysis before discharge.  Will observe the patient overnight. Coronary Diagrams  Diagnostic Dominance: Left  Intervention      Patient Profile   63 year old married Caucasian male patient of Dr. Waddell and Dr. Darron with a history of essential hypertension, hyperlipidemia, end-stage renal disease on hemodialysis since May.  He had a positive stress test 01/17/2024 and is being considered for renal transplant.  He presented yesterday for outpatient diagnostic coronary angiography.  Assessment & Plan   1: Positive stress test-stress test done for pretransplant evaluation 01/17/2024 at [facility] was abnormal.  Cardiac catheterization performed by Dr. Darron yesterday showed an occluded diagonal branch, 80% OM1 stenosis and subtotally occluded small nondominant RCA.  He underwent shockwave angioplasty followed by PCI and stenting of the mid circumflex.  He had an excellent angiographic result.  This was done femoral he.  He had Mynx closure and is stable.  He is on DAPT with aspirin  clopidogrel  which will need to  be uninterrupted for at least 6 months.  2: Essential hypertension-blood pressure stable on metoprolol   3: Hyperlipidemia-LDL 38  4: CKD-on hemodialysis which she had last night.  He did have dialysis for 3 hours.  He did have 250 cc of contrast during the case.  Postop day 1 status post LCx PCI and stenting.  Okay for discharge.  Will arrange TOC 7 after which she will see Liberty back in the office in 4 to 6 weeks.  Okay to resume hemodialysis on Monday.  DAPT with aspirin  clopidogrel .    For questions or updates, please contact [facility] Please consult [email or web address] for contact info under     Signed, Dorn Lesches, MD  02/03/2024, 8:35 AM

## 2024-02-03 NOTE — Progress Notes (Signed)
 Pt has ambulated hall without c/o. Discussed with pt and wife stent, restrictions, Plavix  importance, exercise, NTG, and CRPII. Pt receptive. Encouraged him to discuss diet with his RD. Will refer to [place] CRPII. He goes to HD at 1200 therefore could do CRPII in the am.  [id number] Aliene Aris BS, ACSM-CEP 02/03/2024 9:10 AM

## 2024-02-03 NOTE — Discharge Summary (Addendum)
 Discharge Summary   Patient ID: David Cowan MRN: [id number]; DOB: [birth year]  Admit date: 02/02/2024 Discharge date: 02/03/2024  PCP:  Wendee Lynwood HERO, NP   [facility] Providers Cardiologist:  Danelle Birmingham, MD  [facility] Cardiologist:  Deatrice Cage, MD    Discharge Diagnoses  Principal Problem:   CAD S/P percutaneous coronary angioplasty Active Problems:   CAD (coronary artery disease)   Diagnostic Studies/Procedures   Left Heart Catheterization 02/02/24    Mid Cx lesion is 80% stenosed.   LPAV lesion is 40% stenosed.   1st Diag lesion is 100% stenosed.   Mid LAD lesion is 30% stenosed.   1st Mrg lesion is 90% stenosed.   Prox RCA to Mid RCA lesion is 95% stenosed.   A drug-eluting stent was successfully placed using a STENT SYNERGY XD 3.0X16.   Post intervention, there is a 0% residual stenosis.   The left ventricular systolic function is normal.   LV end diastolic pressure is moderately elevated.   The left ventricular ejection fraction is 55-65% by visual estimate.   In the absence of any other complications or medical issues, we expect the patient to be ready for discharge from an interventional cardiology perspective on 02/03/2024.   Recommend uninterrupted dual antiplatelet therapy with Aspirin  81mg  daily and Clopidogrel  75mg  daily for a minimum of 6 months (stable ischemic heart disease-Class I recommendation).   1.  Left dominant coronary arteries with significant three-vessel coronary artery disease.  The LAD is free of significant disease but the first diagonal is known to be chronically occluded.  The RCA is small and nondominant with subtotal occlusion in the midsegment.  There is 80% calcified stenosis in the mid left circumflex which was highly significant by pressure gradient with an RFR of 0.6.  Diffusely diseased OM1 but very small in size. 2.  Normal left ventricular systolic function with moderately elevated left ventricular end-diastolic pressure at 26  mmHg. 3.  Successful intravascular lithotripsy and drug-eluting stent placement to the mid left circumflex.   Recommendations: The procedure required 250 mL of contrast due to calcifications and difficult visualization.  Due to that and given elevated LVEDP to 26 mmHg, recommend dialysis before discharge.  Will observe the patient overnight.  Diagnostic Dominance: Left  Intervention   _____________   History of Present Illness   David Cowan is a 63 y.o. male with a past medical history of ESRD secondary to FSGS, prediabetes, hypertension, CAD, COPD, PAD.  Patient is on dialysis on Monday Wednesday Friday, receives dialysis through a right brachiocephalic AVF.  He has a remote history of NSTEMI in 2002.  Catheterization at that time showed left main with 25% ostial stenosis, a long segment of stenosis in the LAD around 25%, the second diagonal was a large vessel with high-grade disease in a subbranch that was occluded at the ostium. The circumflex had luminal irregularities at the AV groove, a large obtuse marginal had a 50% mid stenosis. RCA was nondominant, with a long 70% stenosis around the mid segment, and a small RV branch with 99% proximal stenosis. Last seen with our office by Dr. Cage in April 2024 for management of his peripheral arterial disease with statin therapy, aspirin , and Pletal .   He has been worked up through [facility] for a possible kidney transplant. He underwent echocardiogram and nuclear stress test on 01/17/2024. Echocardiogram showed left ventricular size is normal with moderate concentric hypertrophy.  LVEF 55-60%, no RWMA, mildly dilated right ventricle, focal thickening  and restriction of the NCC, mild aortic regurgitation and aortic stenosis, trace mitral, pulmonic, and tricuspid regurgitation.  The nuclear stress test on 01/17/2024 showed a small size, mild to moderate intensity, predominantly reversible perfusion defects involving the distal LAD and circumflex  territory, suggestive of ischemia, LVEF 56% during rest and 55% during stress, no regional wall motion abnormality.   He was seen by our practice in clinic on 8/11.  With patient's recent positive nuclear stress test, recommend proceeding with cardiac catheterization   Hospital Course    Patient presented on 8/21 for scheduled cardiac catheterization.  Was taken to the Cath Lab with Dr. Darron.  Found to have left dominant coronary arteries with significant three-vessel coronary artery disease.  LAD was free of significant disease but the first diagonal is known to be chronically occluded.  RCA is small and nondominant with subtotal occlusion in the midsegment.  There was 80% calcified stenosis in the mid left circumflex which was highly significant by pressure gradient with RFR of 0.6.  Underwent successful intravascular lithotripsy and DES placement to the mid left circumflex.  Because the cardiac catheterization required to 15 mL of contrast due to calcifications and difficult visualization and LVEDP was elevated to 26 mmHg, was recommended that patient undergo dialysis prior to discharge.  He was admitted to the hospital following cath.  Nephrology was consulted and patient underwent dialysis on 8/21.  On 8/22, patient was seen by cardiac rehab.  He was able to ambulate in the hall without complaints.  Patient was educated on his stents, activity restrictions, Plavix  importance, exercise and cardiac rehab.  He was seen by Dr. Court.  His groin cath site was stable.  He was deemed stable for discharge.  Coronary artery disease S/p DES to mid circumflex - Cardiac catheterization performed by Dr. Darron yesterday showed an occluded diagonal branch, 80% OM1 stenosis and subtotally occluded small nondominant RCA.  He underwent shockwave angioplasty followed by PCI and stenting of the mid circumflex.  He had an excellent angiographic result.  -Continue DAPT with aspirin  and Plavix  which will need to be  uninterrupted for at least 6 months - Continue metoprolol  succinate 100 mg daily - Continue Crestor  40 mg daily and repatha   - Discussed femoral site care and provided written instructions on AVS - Follow up with General Cardiology arranged from 9/4   PAD  - Prior to admission, patient was on Pletal  100 mg BID. Now that he is on DAPT for CAD, will stop Pletal    Hypertension - BP overall well-controlled this admission.   - Continue metoprolol  succinate 100 mg daily  - Patient had been told to stop his maxzide  several weeks ago, but it continues to be on his med list. He does not take this medication at home, stopped at DC   Hyperlipidemia - LDL 38  - Continue crestor  40 mg daily and repatha    End-stage renal disease -On hemodialysis Monday Wednesday Friday -Patient did undergo hemodialysis on 8/21 after cath given elevated LVEDP and 250 cc contrast during case - Continue lasix on nondialysis days  -Followed by nephrology as an outpatient    Did the patient have an acute coronary syndrome (MI, NSTEMI, STEMI, etc) this admission?:  No                               Did the patient have a percutaneous coronary intervention (stent / angioplasty)?:  Yes.  Cath/PCI Registry Performance & Quality Measures: Aspirin  prescribed? - Yes ADP Receptor Inhibitor (Plavix /Clopidogrel , Brilinta/Ticagrelor or Effient/Prasugrel) prescribed (includes medically managed patients)? - Yes High Intensity Statin (Lipitor 40-80mg  or Crestor  20-40mg ) prescribed? - Yes For EF <40%, was ACEI/ARB prescribed? - Not Applicable (EF >/= 40%) For EF <40%, Aldosterone Antagonist (Spironolactone or Eplerenone) prescribed? - Not Applicable (EF >/= 40%) Cardiac Rehab Phase II ordered? - Yes   _____________  Discharge Vitals Blood pressure 120/65, pulse 81, temperature 98 F (36.7 C), temperature source Oral, resp. rate 17, height 5' 10 (1.778 m), weight 105.8 kg, SpO2 94%.  Filed Weights   02/02/24 1918 02/02/24  2230 02/03/24 0500  Weight: 111.3 kg 110.6 kg 105.8 kg    Labs & Radiologic Studies  CBC Recent Labs    02/03/24 0339  WBC 10.8*  HGB 10.6*  HCT 32.8*  MCV 94.8  PLT 152   Basic Metabolic Panel Recent Labs    91/77/74 0339  NA 134*  K 3.5  CL 95*  CO2 28  GLUCOSE 145*  BUN 26*  CREATININE 3.65*  CALCIUM  8.7*   Liver Function Tests No results for input(s): AST, ALT, ALKPHOS, BILITOT, PROT, ALBUMIN in the last 72 hours. No results for input(s): LIPASE, AMYLASE in the last 72 hours. High Sensitivity Troponin:   No results for input(s): TROPONINIHS in the last 720 hours.  No results for input(s): TRNPT in the last 720 hours.  BNP Invalid input(s): POCBNP No results for input(s): PROBNP in the last 72 hours.  No results for input(s): BNP in the last 72 hours.  D-Dimer No results for input(s): DDIMER in the last 72 hours. Hemoglobin A1C No results for input(s): HGBA1C in the last 72 hours. Fasting Lipid Panel No results for input(s): CHOL, HDL, LDLCALC, TRIG, CHOLHDL, LDLDIRECT in the last 72 hours. No results found for: LIPOA  Thyroid  Function Tests No results for input(s): TSH, T4TOTAL, T3FREE, THYROIDAB in the last 72 hours.  Invalid input(s): FREET3 _____________  CARDIAC CATHETERIZATION Result Date: 02/02/2024   Mid Cx lesion is 80% stenosed.   LPAV lesion is 40% stenosed.   1st Diag lesion is 100% stenosed.   Mid LAD lesion is 30% stenosed.   1st Mrg lesion is 90% stenosed.   Prox RCA to Mid RCA lesion is 95% stenosed.   A drug-eluting stent was successfully placed using a STENT SYNERGY XD 3.0X16.   Post intervention, there is a 0% residual stenosis.   The left ventricular systolic function is normal.   LV end diastolic pressure is moderately elevated.   The left ventricular ejection fraction is 55-65% by visual estimate.   In the absence of any other complications or medical issues, we expect the patient to be  ready for discharge from an interventional cardiology perspective on 02/03/2024.   Recommend uninterrupted dual antiplatelet therapy with Aspirin  81mg  daily and Clopidogrel  75mg  daily for a minimum of 6 months (stable ischemic heart disease-Class I recommendation). 1.  Left dominant coronary arteries with significant three-vessel coronary artery disease.  The LAD is free of significant disease but the first diagonal is known to be chronically occluded.  The RCA is small and nondominant with subtotal occlusion in the midsegment.  There is 80% calcified stenosis in the mid left circumflex which was highly significant by pressure gradient with an RFR of 0.6.  Diffusely diseased OM1 but very small in size. 2.  Normal left ventricular systolic function with moderately elevated left ventricular end-diastolic pressure at 26 mmHg. 3.  Successful intravascular lithotripsy and drug-eluting stent placement to the mid left circumflex. Recommendations: The procedure required 250 mL of contrast due to calcifications and difficult visualization.  Due to that and given elevated LVEDP to 26 mmHg, recommend dialysis before discharge.  Will observe the patient overnight.    Disposition Pt is being discharged home today in good condition.  Follow-up Plans & Appointments  Discharge Instructions     Amb Referral to Cardiac Rehabilitation   Complete by: As directed    Diagnosis:  Coronary Stents PTCA     After initial evaluation and assessments completed: Virtual Based Care may be provided alone or in conjunction with Phase 2 Cardiac Rehab based on patient barriers.: Yes   Intensive Cardiac Rehabilitation (ICR) [facility] location only OR Traditional Cardiac Rehabilitation (TCR) *If criteria for ICR are not met will enroll in TCR [facility] only): Yes   Diet - low sodium heart healthy   Complete by: As directed    Discharge instructions   Complete by: As directed    Groin Site Care Refer to this sheet in the next few weeks. These  instructions provide you with information on caring for yourself after your procedure. Your caregiver may also give you more specific instructions. Your treatment has been planned according to current medical practices, but problems sometimes occur. Call your caregiver if you have any problems or questions after your procedure. HOME CARE INSTRUCTIONS You may shower 24 hours after the procedure. Remove the bandage (dressing) and gently wash the site with plain soap and water . Gently pat the site dry.  Do not apply powder or lotion to the site.  Do not sit in a bathtub, swimming pool, or whirlpool for 5 to 7 days.  No bending, squatting, or lifting anything over 10 pounds (4.5 kg) as directed by your caregiver.  Inspect the site at least twice daily.  Do not drive home if you are discharged the same day of the procedure. Have someone else drive you.  You may drive 24 hours after the procedure unless otherwise instructed by your caregiver.  What to expect: Any bruising will usually fade within 1 to 2 weeks.  Blood that collects in the tissue (hematoma) may be painful to the touch. It should usually decrease in size and tenderness within 1 to 2 weeks.  SEEK IMMEDIATE MEDICAL CARE IF: You have unusual pain at the groin site or down the affected leg.  You have redness, warmth, swelling, or pain at the groin site.  You have drainage (other than a small amount of blood on the dressing).  You have chills.  You have a fever or persistent symptoms for more than 72 hours.  You have a fever and your symptoms suddenly get worse.  Your leg becomes pale, cool, tingly, or numb.  You have heavy bleeding from the site. Hold pressure on the site. .   Increase activity slowly   Complete by: As directed        Discharge Medications Allergies as of 02/03/2024       Reactions   Cucumber Extract Anaphylaxis, Other (See Comments)   Black out   Amoxicillin Rash   Penicillins Rash        Medication List      STOP taking these medications    cilostazol  100 MG tablet Commonly known as: PLETAL    triamterene -hydrochlorothiazide  37.5-25 MG tablet Commonly known as: MAXZIDE -25       TAKE these medications    albuterol  108 (90 Base) MCG/ACT inhaler  Commonly known as: VENTOLIN  HFA Inhale 2 puffs into the lungs every 6 (six) hours as needed for wheezing or shortness of breath.   aspirin  81 MG tablet Take 1 tablet (81 mg total) by mouth daily.   cetirizine 10 MG tablet Commonly known as: ZYRTEC Take 10 mg by mouth daily as needed for allergies.   clopidogrel  75 MG tablet Commonly known as: PLAVIX  Take 1 tablet (75 mg total) by mouth daily with breakfast. Start taking on: February 04, 2024   fenofibrate  160 MG tablet Take 1 tablet (160 mg total) by mouth daily.   fluticasone  50 MCG/ACT nasal spray Commonly known as: FLONASE  Place 2 sprays into both nostrils at bedtime. What changed:  when to take this reasons to take this   furosemide 40 MG tablet Commonly known as: LASIX Take 40 mg by mouth Every Tuesday,Thursday,and Saturday with dialysis.   lidocaine -prilocaine  cream Commonly known as: EMLA  Apply 1 Application topically every Monday, Wednesday, and Friday with hemodialysis.   metoprolol  succinate 100 MG 24 hr tablet Commonly known as: TOPROL -XL TAKE 1 TABLET BY MOUTH AT  BEDTIME. TAKE WITH OR  IMMEDIATELY FOLLOWING A  MEAL.   nitroGLYCERIN  0.4 MG SL tablet Commonly known as: NITROSTAT  Place 1 tablet (0.4 mg total) under the tongue every 5 (five) minutes as needed for chest pain.   Repatha  SureClick 140 MG/ML Soaj Generic drug: Evolocumab  INJECT 140 MG INTO THE SKIN EVERY 14 (FOURTEEN) DAYS.   rosuvastatin  40 MG tablet Commonly known as: CRESTOR  Take 1 tablet (40 mg total) by mouth daily.   sevelamer  carbonate 800 MG tablet Commonly known as: RENVELA  Take 1,600 mg by mouth 3 (three) times daily with meals.   Vitamin D  (Ergocalciferol ) 1.25 MG (50000 UNIT)  Caps capsule Commonly known as: DRISDOL  Take 1 capsule (50,000 Units total) by mouth every 7 (seven) days.         Outstanding Labs/Studies   Duration of Discharge Encounter: APP Time: 20 minutes   Signed, Rollo FABIENE Louder, PA-C 02/03/2024, 10:43 AM   Agree with note by Rollo Louder PA-C   Patient was admitted for same-day cath by Dr. Darron after [facility].  He has chronic renal insufficiency on hemodialysis since May and on the transplant list.  Other problems include history of hypertension, and hyperlipidemia.  His cath revealed an occluded first diagonal branch and high-grade first circumflex obtuse marginal branch stenosis which underwent shockwave lithotripsy followed by PCI drug-eluting stenting with excellent result.  This was done through the right femoral approach, Mynx closure was performed.  The patient had hemodialysis yesterday and is scheduled for hemodialysis again on Monday.  He was stable overnight.  He is asymptomatic.  His exam is benign.  He is deemed stable for discharge on DAPT.  Will arrange outpatient follow-up with an APP in 7 days and with Dr. Darron in 4 to 6 weeks.  Dorn DOROTHA Lesches, M.D., FACP, [facility], FAHA, [facility]  [address] [place], [place]  [address]  [phone] 02/03/2024 1:37 PM

## 2024-02-03 NOTE — TOC CM/SW Note (Signed)
 Transition of Care [facility]) - Inpatient Brief Assessment   Patient Details  Name: David Cowan MRN: [id number] Date of Birth: [birth year]  Transition of Care [facility]) CM/SW Contact:    Lauraine FORBES Saa, LCSWA Phone Number: 02/03/2024, 9:39 AM   Clinical Narrative:  9:39 AM Per chart review, patient resides at home with spouse. Patient has a PCP and insurance. Patient does not have SNF history. Patient has HH history with [facility] and DME (BSC, rolling walker) history with [facility]. Patient's preferred pharmacy is [facility] [phone] [place]. No TOC needs were identified at this time. TOC will continue to follow and be available to assist.  Transition of Care Asessment: Insurance and Status: Insurance coverage has been reviewed Patient has primary care physician: Yes Home environment has been reviewed: Private Residence Prior level of function:: N/A Prior/Current Home Services: No current home services Social Drivers of Health Review: SDOH reviewed no interventions necessary Readmission risk has been reviewed: Yes (Currently Green 13%) Transition of care needs: no transition of care needs at this time

## 2024-02-03 NOTE — Progress Notes (Addendum)
 D/c orders noted. Contacted op Hd clinic to inform of pt d/c and to anticipate back at clinic Monday.   Lavanda Fabienne Nolasco Dialysis Navigator [phone]

## 2024-02-04 ENCOUNTER — Telehealth: Payer: Self-pay | Admitting: Nephrology

## 2024-02-04 NOTE — Telephone Encounter (Signed)
 Transition of Care - Initial Contact after Hospitalization  Date of discharge: 02/03/24 Date of contact: 02/04/24  Method: Phone Spoke to: Patient  Patient contacted to discuss transition of care from recent inpatient hospitalization. Patient was admitted to [facility] from 8/21- 02/03/24 discharge diagnosis of CAD s/p PCI.   The discharge medication list was reviewed. Patient understands the changes   Patient will return to his/her outpatient HD unit on: Monday  He will follow up with cardiology to decide whether he should proceed with his scheduled colonoscopy in September.

## 2024-02-06 ENCOUNTER — Telehealth: Payer: Self-pay

## 2024-02-06 DIAGNOSIS — N186 End stage renal disease: Secondary | ICD-10-CM | POA: Diagnosis not present

## 2024-02-06 DIAGNOSIS — N2581 Secondary hyperparathyroidism of renal origin: Secondary | ICD-10-CM | POA: Diagnosis not present

## 2024-02-06 DIAGNOSIS — Z992 Dependence on renal dialysis: Secondary | ICD-10-CM | POA: Diagnosis not present

## 2024-02-06 NOTE — Patient Instructions (Signed)
 Visit Information  Thank you for taking time to visit with me today. Please don't hesitate to contact me if I can be of assistance to you:    Patient instructions per discharge summary: Discharge instructions Groin Site Care Refer to this sheet in the next few weeks. These instructions provide you with information on caring for yourself after your procedure. Your caregiver may also give you more specific instructions. Your treatment has been planned according to current medical practices, but problems sometimes occur. Call your caregiver if you have any problems or questions after your procedure. HOME CARE INSTRUCTIONS You may shower 24 hours after the procedure. Remove the bandage (dressing) and gently wash the site with plain soap and water . Gently pat the site dry. Do not apply powder or lotion to the site. Do not sit in a bathtub, swimming pool, or whirlpool for 5 to 7 days. No bending, squatting, or lifting anything over 10 pounds (4.5 kg) as directed by your caregiver. Inspect the site at least twice daily. Do not drive home if you are discharged the same day of the procedure. Have someone else drive you. You may drive 24 hours after the procedure unless otherwise instructed by your caregiver. What to expect: Any bruising will usually fade within 1 to 2 weeks. Blood that collects in the tissue (hematoma) may be painful to the touch. It should usually decrease in size and tenderness within 1 to 2 weeks. SEEK IMMEDIATE MEDICAL CARE IF: You have unusual pain at the groin site or down the affected leg. You have redness, warmth, swelling, or pain at the groin site. You have drainage (other than a small amount of blood on the dressing). You have chills. You have a fever or persistent symptoms for more than 72 hours. You have a fever and your symptoms suddenly get worse. Your leg becomes pale, cool, tingly, or numb. You have heavy bleeding from the site. Hold pressure on the site.  .   Patient verbalizes understanding of instructions and care plan provided today and agrees to view in MyChart. Active MyChart status and patient understanding of how to access instructions and care plan via MyChart confirmed with patient.     The patient has been provided with contact information for the care management team and has been advised to call with any health related questions or concerns.   Please call the care guide team at [phone] if you need to cancel or reschedule your appointment.   Please call the Suicide and Crisis Lifeline: 988 call the [place]  National Suicide Prevention Lifeline: 919-162-4584 or TTY: (413)213-4404 TTY 305-465-8573) to talk to a trained counselor call 1-800-273-TALK (toll free, 24 hour hotline) go to [facility] [address], [place] [phone]) if you are experiencing a Mental Health or Behavioral Health Crisis or need someone to talk to.  Arvin Seip RN, BSN, CCM [employer], Population Health Case Manager Phone: [phone]

## 2024-02-07 ENCOUNTER — Telehealth: Payer: Self-pay | Admitting: Physician Assistant

## 2024-02-07 ENCOUNTER — Other Ambulatory Visit: Payer: Self-pay | Admitting: Cardiovascular Disease

## 2024-02-07 DIAGNOSIS — E785 Hyperlipidemia, unspecified: Secondary | ICD-10-CM

## 2024-02-07 DIAGNOSIS — I10 Essential (primary) hypertension: Secondary | ICD-10-CM

## 2024-02-07 DIAGNOSIS — I2510 Atherosclerotic heart disease of native coronary artery without angina pectoris: Secondary | ICD-10-CM

## 2024-02-07 NOTE — Telephone Encounter (Signed)
 Colonoscopy was for screening Former Dr. Aneita  patient We can perform colonoscopy once Plavix  can be interrupted for 5 days for screening in the outpatient hospital setting [facility]

## 2024-02-07 NOTE — Telephone Encounter (Signed)
 Inbound call from patient stating he had a stent placed last Thursday 8/21 and now taking Plavix . States his cardiologist advise if he does have polyps they would not be able to be removed. Patient is requesting a call back to discuss further. Please advise, thank you

## 2024-02-07 NOTE — Telephone Encounter (Signed)
 Pt cancelled colon scheduled at hosp 03/13/24. He had a stent placed Thursday and cannot come off of plavix . Cardiology told him if there were polyps present they would not be able to remove them. Appt cancelled, Dr Albertus and Alan Fusi CMA aware.

## 2024-02-08 DIAGNOSIS — N186 End stage renal disease: Secondary | ICD-10-CM | POA: Diagnosis not present

## 2024-02-08 DIAGNOSIS — Z992 Dependence on renal dialysis: Secondary | ICD-10-CM | POA: Diagnosis not present

## 2024-02-08 DIAGNOSIS — N2581 Secondary hyperparathyroidism of renal origin: Secondary | ICD-10-CM | POA: Diagnosis not present

## 2024-02-10 ENCOUNTER — Other Ambulatory Visit: Payer: Self-pay | Admitting: Internal Medicine

## 2024-02-10 ENCOUNTER — Other Ambulatory Visit: Payer: Self-pay | Admitting: Cardiovascular Disease

## 2024-02-10 DIAGNOSIS — I2510 Atherosclerotic heart disease of native coronary artery without angina pectoris: Secondary | ICD-10-CM

## 2024-02-10 DIAGNOSIS — N186 End stage renal disease: Secondary | ICD-10-CM | POA: Diagnosis not present

## 2024-02-10 DIAGNOSIS — E785 Hyperlipidemia, unspecified: Secondary | ICD-10-CM

## 2024-02-10 DIAGNOSIS — Z992 Dependence on renal dialysis: Secondary | ICD-10-CM | POA: Diagnosis not present

## 2024-02-10 DIAGNOSIS — I10 Essential (primary) hypertension: Secondary | ICD-10-CM

## 2024-02-10 DIAGNOSIS — N2581 Secondary hyperparathyroidism of renal origin: Secondary | ICD-10-CM | POA: Diagnosis not present

## 2024-02-12 DIAGNOSIS — N186 End stage renal disease: Secondary | ICD-10-CM | POA: Diagnosis not present

## 2024-02-12 DIAGNOSIS — Z992 Dependence on renal dialysis: Secondary | ICD-10-CM | POA: Diagnosis not present

## 2024-02-12 DIAGNOSIS — I120 Hypertensive chronic kidney disease with stage 5 chronic kidney disease or end stage renal disease: Secondary | ICD-10-CM | POA: Diagnosis not present

## 2024-02-14 ENCOUNTER — Encounter (HOSPITAL_BASED_OUTPATIENT_CLINIC_OR_DEPARTMENT_OTHER): Payer: Self-pay

## 2024-02-16 ENCOUNTER — Ambulatory Visit: Attending: Physician Assistant | Admitting: Physician Assistant

## 2024-02-16 ENCOUNTER — Encounter: Payer: Self-pay | Admitting: Physician Assistant

## 2024-02-16 VITALS — BP 140/56 | HR 63 | Ht 70.0 in | Wt 237.4 lb

## 2024-02-16 DIAGNOSIS — N186 End stage renal disease: Secondary | ICD-10-CM | POA: Diagnosis not present

## 2024-02-16 DIAGNOSIS — I2510 Atherosclerotic heart disease of native coronary artery without angina pectoris: Secondary | ICD-10-CM | POA: Diagnosis not present

## 2024-02-16 DIAGNOSIS — I10 Essential (primary) hypertension: Secondary | ICD-10-CM

## 2024-02-16 DIAGNOSIS — I6523 Occlusion and stenosis of bilateral carotid arteries: Secondary | ICD-10-CM

## 2024-02-16 DIAGNOSIS — Z992 Dependence on renal dialysis: Secondary | ICD-10-CM

## 2024-02-16 DIAGNOSIS — E785 Hyperlipidemia, unspecified: Secondary | ICD-10-CM

## 2024-02-16 DIAGNOSIS — J449 Chronic obstructive pulmonary disease, unspecified: Secondary | ICD-10-CM

## 2024-02-16 NOTE — Progress Notes (Signed)
 Cardiology Office Note   Date:  02/18/2024  ID:  [name], Marzette 11/14/1960, MRN [id number] PCP: Wendee Lynwood HERO, NP  [facility] Providers Cardiologist:  Danelle Birmingham, MD PV Cardiologist:  Deatrice Cage, MD     History of Present Illness David Cowan is a 63 y.o. male with PMH of ESRD on HD MWF, hypertension, hyperlipidemia, prediabetes, CAD, COPD and PAD.  He had an NSTEMI in 2002, chronic catheterization at that time showed 25% left main, long segment of stenosis in the LAD around 25%, second diagonal with a large vessel and had high-grade disease in the subbranch that was occluded at the ostium, 50% mid OM lesion, RCA was nondominant and had a 70% stenosis around the mid segment, 99% small RV branch proximal stenosis.  He is currently on kidney transplant list at [facility].  Echocardiogram obtained on 01/17/2024 showed EF 55 to 60%, no regional wall motion abnormality, mild AI and aortic stenosis.  Myoview obtained on 01/17/2024 showed a small size mild to moderate intensity reversible perfusion defect involving the distal LAD and left circumflex territory suggestive of ischemia, EF 56% at rest.  Patient ultimately underwent outpatient cardiac catheterization on 02/02/2024 that showed 80% mid left circumflex lesion treated with 3.0 x 16 mm DES, 40% PAP lesion, 100% D1 occlusion, 30% mid LAD lesion, 90% OM1 lesion, 95% proximal to mid nondominant small RCA lesion.  EF 55 to 65%.  Postprocedure, patient was placed on aspirin  and Plavix  for minimum 6 months.  Patient presents today for follow-up.  He denies any recent chest pain.  He has been compliant with aspirin  and Plavix .  He is not aware that we plan to continue aspirin  and Plavix  for about 6 months.  On physical exam, he does have markedly diminished breath sound.  He has a history of COPD and is currently not on any maintenance therapy.  His last visit with pulmonology service was in November 2023, he should reestablish with pulmonology  service.  Recent carotid Doppler does show greater than 70% lesion in left carotid artery.  His vascular surgeon is Dr. Pearline who did his AV fistula, advised him to contact vascular surgery to see what they would recommend for his significant coronary artery disease.  He can follow-up with Dr. Cage in 4 to 6 months   ROS:   He denies chest pain, palpitations, dyspnea, pnd, orthopnea, n, v, dizziness, syncope, edema, weight gain, or early satiety. All other systems reviewed and are otherwise negative except as noted above.    Studies Reviewed EKG Interpretation Date/Time:  Thursday February 16 2024 16:04:55 EDT Ventricular Rate:  63 PR Interval:  182 QRS Duration:  98 QT Interval:  424 QTC Calculation: 433 R Axis:   15  Text Interpretation: Normal sinus rhythm Inferior infarct , age undetermined When compared with ECG of 03-Feb-2024 07:06, Inferior infarct is now Present T wave inversion now evident in Inferior leads Confirmed by Janene Boer [phone]) on 02/16/2024 4:34:13 PM    Cardiac Studies & Procedures   ______________________________________________________________________________________________ CARDIAC CATHETERIZATION  CARDIAC CATHETERIZATION 02/02/2024  Conclusion   Mid Cx lesion is 80% stenosed.   LPAV lesion is 40% stenosed.   1st Diag lesion is 100% stenosed.   Mid LAD lesion is 30% stenosed.   1st Mrg lesion is 90% stenosed.   Prox RCA to Mid RCA lesion is 95% stenosed.   A drug-eluting stent was successfully placed using a STENT SYNERGY XD 3.0X16.   Post intervention, there is a  0% residual stenosis.   The left ventricular systolic function is normal.   LV end diastolic pressure is moderately elevated.   The left ventricular ejection fraction is 55-65% by visual estimate.   In the absence of any other complications or medical issues, we expect the patient to be ready for discharge from an interventional cardiology perspective on 02/03/2024.   Recommend uninterrupted  dual antiplatelet therapy with Aspirin  81mg  daily and Clopidogrel  75mg  daily for a minimum of 6 months (stable ischemic heart disease-Class I recommendation).  1.  Left dominant coronary arteries with significant three-vessel coronary artery disease.  The LAD is free of significant disease but the first diagonal is known to be chronically occluded.  The RCA is small and nondominant with subtotal occlusion in the midsegment.  There is 80% calcified stenosis in the mid left circumflex which was highly significant by pressure gradient with an RFR of 0.6.  Diffusely diseased OM1 but very small in size. 2.  Normal left ventricular systolic function with moderately elevated left ventricular end-diastolic pressure at 26 mmHg. 3.  Successful intravascular lithotripsy and drug-eluting stent placement to the mid left circumflex.  Recommendations: The procedure required 250 mL of contrast due to calcifications and difficult visualization.  Due to that and given elevated LVEDP to 26 mmHg, recommend dialysis before discharge.  Will observe the patient overnight.  Findings Coronary Findings Diagnostic  Dominance: Left  Left Main Vessel is angiographically normal.  Left Anterior Descending Mid LAD lesion is 30% stenosed. The lesion is mildly calcified.  First Diagonal Branch 1st Diag lesion is 100% stenosed.  Left Circumflex Mid Cx lesion is 80% stenosed. The lesion is type C. The lesion is severely calcified. Pressure gradient was performed on the lesion using a GUIDEWIRE PRESSURE X 175 and CATH VISTA GUIDE 6FR XB3.5 EPK. RFR: 0.6.  First Obtuse Marginal Branch Vessel is small in size. 1st Mrg lesion is 90% stenosed.  Third Obtuse Marginal Branch Vessel is large in size.  Left Posterior Atrioventricular Artery LPAV lesion is 40% stenosed.  Right Coronary Artery Prox RCA to Mid RCA lesion is 95% stenosed.  Intervention  Mid Cx lesion IVL Intravascular Lithotripsy was performed using a CATH  SHOCKWAVE C2 3.0X12 with a maximum pressure of 4 atm, for 70 pulses. Stent Pre-stent angioplasty was performed using a BALLOON EMERGE MR [id number]. Maximum pressure:  10 atm. Inflation time:  20 sec. A drug-eluting stent was successfully placed using a STENT SYNERGY XD 3.0X16. Maximum pressure: 14 atm. Inflation time: 20 sec. Post-stent angioplasty was performed using a BALLOON SAPPHIRE NC24 3.5X12. Maximum pressure:  14 atm. Inflation time:  20 sec. Post-Intervention Lesion Assessment The intervention was successful. Pre-interventional TIMI flow is 3. Post-intervention TIMI flow is 3. No complications occurred at this lesion. There is a 0% residual stenosis post intervention.              ______________________________________________________________________________________________      Risk Assessment/Calculations          Physical Exam VS:  BP (!) 140/56 (BP Location: Left Arm, Patient Position: Sitting, Cuff Size: Large)   Pulse 63   Ht 5' 10 (1.778 m)   Wt 237 lb 6.4 oz (107.7 kg)   SpO2 97%   BMI 34.06 kg/m        Wt Readings from Last 3 Encounters:  02/16/24 237 lb 6.4 oz (107.7 kg)  02/03/24 233 lb 4 oz (105.8 kg)  01/23/24 245 lb 9.6 oz (111.4 kg)    GEN: Well  nourished, well developed in no acute distress NECK: No JVD; No carotid bruits CARDIAC: RRR, no murmurs, rubs, gallops RESPIRATORY:  Clear to auscultation without rales, wheezing or rhonchi  ABDOMEN: Soft, non-tender, non-distended EXTREMITIES:  No edema; No deformity   ASSESSMENT AND PLAN  CAD: As part of renal transplant requirement, patient recently underwent stress test that came back abnormal.  He subsequently underwent cardiac catheterization on 02/02/2024 that showed 80% mid left circumflex lesion treated with Synergy XD 3.0 x 16 mm DES.  He has a chronic total occlusion in D1 and the 95% proximal to mid RCA lesion.  RCA is a small nondominant vessel, therefore this was managed medically.  He is doing  well since the recent discharge and continued on aspirin  and Plavix .  He is aware to continue on dual antiplatelet therapy for minimum of 6 months.  Hypertension: Blood pressure borderline controlled today, however previously very well-controlled.  Will continue on the current therapy  Hyperlipidemia: On Repatha  and rosuvastatin   ESRD: On dialysis.  Currently on renal transplant list at [facility]  COPD: Diminished breath sounds throughout the entire lung.  Recommend follow-up with his pulmonologist.  He does not require oxygen.  Coronary artery disease: Recent carotid Doppler obtained at [facility] showed > 70% left carotid artery lesion, recommend follow-up with his vascular surgeon.    Cardiac Rehabilitation Eligibility Assessment  The patient is ready to start cardiac rehabilitation from a cardiac standpoint.       Dispo: Follow-up with Dr. Darron in 4-6  Signed, Leeza Heiner, [place]

## 2024-02-16 NOTE — Patient Instructions (Signed)
 Medication Instructions:  Your physician recommends that you continue on your current medications as directed. Please refer to the Current Medication list given to you today.  *If you need a refill on your cardiac medications before your next appointment, please call your pharmacy*  Lab Work: None ordered If you have labs (blood work) drawn today and your tests are completely normal, you will receive your results only by: MyChart Message (if you have MyChart) OR A paper copy in the mail If you have any lab test that is abnormal or we need to change your treatment, we will call you to review the results.  Follow-Up: At [facility], you and your health needs are our priority.  As part of our continuing mission to provide you with exceptional heart care, our providers are all part of one team.  This team includes your primary Cardiologist (physician) and Advanced Practice Providers or APPs (Physician Assistants and Nurse Practitioners) who all work together to provide you with the care you need, when you need it.  Your next appointment:   4-6 month(s)  Provider:   Dr Darron

## 2024-02-20 DIAGNOSIS — Z992 Dependence on renal dialysis: Secondary | ICD-10-CM | POA: Diagnosis not present

## 2024-02-20 DIAGNOSIS — N2581 Secondary hyperparathyroidism of renal origin: Secondary | ICD-10-CM | POA: Diagnosis not present

## 2024-02-20 DIAGNOSIS — N186 End stage renal disease: Secondary | ICD-10-CM | POA: Diagnosis not present

## 2024-02-22 DIAGNOSIS — N2581 Secondary hyperparathyroidism of renal origin: Secondary | ICD-10-CM | POA: Diagnosis not present

## 2024-02-22 DIAGNOSIS — Z992 Dependence on renal dialysis: Secondary | ICD-10-CM | POA: Diagnosis not present

## 2024-02-22 DIAGNOSIS — N186 End stage renal disease: Secondary | ICD-10-CM | POA: Diagnosis not present

## 2024-02-24 ENCOUNTER — Telehealth (HOSPITAL_COMMUNITY): Payer: Self-pay | Admitting: *Deleted

## 2024-02-24 DIAGNOSIS — Z992 Dependence on renal dialysis: Secondary | ICD-10-CM | POA: Diagnosis not present

## 2024-02-24 DIAGNOSIS — N2581 Secondary hyperparathyroidism of renal origin: Secondary | ICD-10-CM | POA: Diagnosis not present

## 2024-02-24 DIAGNOSIS — N186 End stage renal disease: Secondary | ICD-10-CM | POA: Diagnosis not present

## 2024-02-24 NOTE — Telephone Encounter (Signed)
-----   Message from Scot Ford sent at 02/23/2024  4:04 PM EDT ----- Regarding: RE: Cardiac rehab and hemodialyis on the same days He is doing good. Can start cardiac rehab any time. His BP was borderline high when he saw me, but I don't anticipate any BP issue with rehab.  Hao ----- Message ----- From: Bernett Saturnino NOVAK, RN Sent: 02/20/2024  10:21 AM EDT To: Scot Ford, PA Subject: Cardiac rehab and hemodialyis on the same da#   Hao,  The above pt who is s/p 02/02/24 DES Cx is eligible to participate in Outpatient Cardiac rehab Phase II.  Seen in follow up with you on 9/4. Every effort will be taken into consideration for scheduling exercise prior to his 1200 dialysis session.   Based upon your assessment, is this pt able to participate in CR and HD on the same days? Any expected issues with BP?  If so, what is acceptable for resting BP and exercise BP?  Thanks so much for your guidance  Saturnino Bernett RN, BSN Cardiac and Pulmonary Rehab Nurse Navigator

## 2024-02-27 ENCOUNTER — Telehealth (HOSPITAL_COMMUNITY): Payer: Self-pay

## 2024-02-27 ENCOUNTER — Ambulatory Visit: Admitting: Physician Assistant

## 2024-02-27 DIAGNOSIS — L299 Pruritus, unspecified: Secondary | ICD-10-CM | POA: Diagnosis not present

## 2024-02-27 DIAGNOSIS — N186 End stage renal disease: Secondary | ICD-10-CM | POA: Diagnosis not present

## 2024-02-27 DIAGNOSIS — N2581 Secondary hyperparathyroidism of renal origin: Secondary | ICD-10-CM | POA: Diagnosis not present

## 2024-02-27 DIAGNOSIS — Z992 Dependence on renal dialysis: Secondary | ICD-10-CM | POA: Diagnosis not present

## 2024-02-27 DIAGNOSIS — Z23 Encounter for immunization: Secondary | ICD-10-CM | POA: Diagnosis not present

## 2024-02-27 NOTE — Telephone Encounter (Signed)
 Pt insurance is active and benefits verified through [employer]. Co-pay unknown (listed from $20-$65), DED $750/$750 met, out of pocket $3,000/$3,000 met, co-insurance 20%. No pre-authorization required. [employer], 02/27/2024 @ 9:19am, REF# [id number].  TCR/ICR? ICR Visit(date of service)limitation? 60 days Can multiple codes be used on the same date of service/visit?(IF ITS A LIMIT) Yes  Is this a lifetime maximum or an annual maximum? Annual Has the member used any of these services to date? No Is there a time limit (weeks/months) on start of program and/or program completion? No

## 2024-02-27 NOTE — Telephone Encounter (Signed)
 Called patient regarding cardiac rehab, went over program. Patient is not interested at this time as he says he has been walking and eating healthier on his own. Informed patient he can call us  back to reopen the referral if he changes his mind.  Closing referral.

## 2024-02-28 ENCOUNTER — Telehealth: Payer: Self-pay | Admitting: Internal Medicine

## 2024-02-28 NOTE — Telephone Encounter (Signed)
 Pt c/o medication issue:  1. Name of Medication:   clopidogrel  (PLAVIX ) 75 MG tablet   2. How are you currently taking this medication (dosage and times per day)?   3. Are you having a reaction (difficulty breathing--STAT)?   No  4. What is your medication issue?    Wife Sanford) stated patient was travelling and had dialysis yesterday and took this medication late  - around 8:00 pm last night.  Wife wants to know if patient should take today's medication as scheduled this morning.

## 2024-02-28 NOTE — Telephone Encounter (Signed)
 Patient missed morning dose of Plavix  yesterday, took last night around 8 PM. He has not taken dose of Plavix  yet today, plans to take today at lunch time (around 1 PM) and will get back on regular morning schedule tomorrow.  Shared recommendation for missed doses from [employer]:  If a dose is missed:  Within less than 12 hours after regular scheduled time: patients should take the dose  immediately and then take the next dose at the regular scheduled time.   For more than 12 hours: patients should take the next dose at the regular scheduled time and should not double the dose.  Patient's wife verbalized understanding and expressed appreciation for call.

## 2024-02-29 DIAGNOSIS — L299 Pruritus, unspecified: Secondary | ICD-10-CM | POA: Diagnosis not present

## 2024-02-29 DIAGNOSIS — N2581 Secondary hyperparathyroidism of renal origin: Secondary | ICD-10-CM | POA: Diagnosis not present

## 2024-02-29 DIAGNOSIS — Z992 Dependence on renal dialysis: Secondary | ICD-10-CM | POA: Diagnosis not present

## 2024-02-29 DIAGNOSIS — N186 End stage renal disease: Secondary | ICD-10-CM | POA: Diagnosis not present

## 2024-02-29 DIAGNOSIS — Z23 Encounter for immunization: Secondary | ICD-10-CM | POA: Diagnosis not present

## 2024-03-02 DIAGNOSIS — N2581 Secondary hyperparathyroidism of renal origin: Secondary | ICD-10-CM | POA: Diagnosis not present

## 2024-03-02 DIAGNOSIS — Z992 Dependence on renal dialysis: Secondary | ICD-10-CM | POA: Diagnosis not present

## 2024-03-02 DIAGNOSIS — N186 End stage renal disease: Secondary | ICD-10-CM | POA: Diagnosis not present

## 2024-03-02 DIAGNOSIS — Z23 Encounter for immunization: Secondary | ICD-10-CM | POA: Diagnosis not present

## 2024-03-02 DIAGNOSIS — L299 Pruritus, unspecified: Secondary | ICD-10-CM | POA: Diagnosis not present

## 2024-03-05 DIAGNOSIS — N186 End stage renal disease: Secondary | ICD-10-CM | POA: Diagnosis not present

## 2024-03-05 DIAGNOSIS — N2581 Secondary hyperparathyroidism of renal origin: Secondary | ICD-10-CM | POA: Diagnosis not present

## 2024-03-05 DIAGNOSIS — Z992 Dependence on renal dialysis: Secondary | ICD-10-CM | POA: Diagnosis not present

## 2024-03-07 DIAGNOSIS — N186 End stage renal disease: Secondary | ICD-10-CM | POA: Diagnosis not present

## 2024-03-07 DIAGNOSIS — N2581 Secondary hyperparathyroidism of renal origin: Secondary | ICD-10-CM | POA: Diagnosis not present

## 2024-03-07 DIAGNOSIS — Z992 Dependence on renal dialysis: Secondary | ICD-10-CM | POA: Diagnosis not present

## 2024-03-08 ENCOUNTER — Other Ambulatory Visit: Payer: Self-pay | Admitting: Cardiovascular Disease

## 2024-03-08 DIAGNOSIS — I10 Essential (primary) hypertension: Secondary | ICD-10-CM

## 2024-03-08 DIAGNOSIS — E785 Hyperlipidemia, unspecified: Secondary | ICD-10-CM

## 2024-03-08 DIAGNOSIS — I2510 Atherosclerotic heart disease of native coronary artery without angina pectoris: Secondary | ICD-10-CM

## 2024-03-09 DIAGNOSIS — N186 End stage renal disease: Secondary | ICD-10-CM | POA: Diagnosis not present

## 2024-03-09 DIAGNOSIS — Z992 Dependence on renal dialysis: Secondary | ICD-10-CM | POA: Diagnosis not present

## 2024-03-09 DIAGNOSIS — N2581 Secondary hyperparathyroidism of renal origin: Secondary | ICD-10-CM | POA: Diagnosis not present

## 2024-03-12 DIAGNOSIS — Z992 Dependence on renal dialysis: Secondary | ICD-10-CM | POA: Diagnosis not present

## 2024-03-12 DIAGNOSIS — N2581 Secondary hyperparathyroidism of renal origin: Secondary | ICD-10-CM | POA: Diagnosis not present

## 2024-03-12 DIAGNOSIS — N186 End stage renal disease: Secondary | ICD-10-CM | POA: Diagnosis not present

## 2024-03-13 ENCOUNTER — Telehealth: Payer: Self-pay | Admitting: Cardiovascular Disease

## 2024-03-13 ENCOUNTER — Encounter (HOSPITAL_COMMUNITY): Payer: Self-pay

## 2024-03-13 ENCOUNTER — Ambulatory Visit (HOSPITAL_COMMUNITY): Admit: 2024-03-13 | Admitting: Internal Medicine

## 2024-03-13 DIAGNOSIS — I120 Hypertensive chronic kidney disease with stage 5 chronic kidney disease or end stage renal disease: Secondary | ICD-10-CM | POA: Diagnosis not present

## 2024-03-13 SURGERY — COLONOSCOPY
Anesthesia: Monitor Anesthesia Care

## 2024-03-13 NOTE — Telephone Encounter (Signed)
 Asking for a copy of patient cath results to be sent over. Fax #[phone]

## 2024-03-14 DIAGNOSIS — N2581 Secondary hyperparathyroidism of renal origin: Secondary | ICD-10-CM | POA: Diagnosis not present

## 2024-03-14 DIAGNOSIS — Z992 Dependence on renal dialysis: Secondary | ICD-10-CM | POA: Diagnosis not present

## 2024-03-14 DIAGNOSIS — Z23 Encounter for immunization: Secondary | ICD-10-CM | POA: Diagnosis not present

## 2024-03-14 DIAGNOSIS — N186 End stage renal disease: Secondary | ICD-10-CM | POA: Diagnosis not present

## 2024-03-16 DIAGNOSIS — N2581 Secondary hyperparathyroidism of renal origin: Secondary | ICD-10-CM | POA: Diagnosis not present

## 2024-03-16 DIAGNOSIS — Z992 Dependence on renal dialysis: Secondary | ICD-10-CM | POA: Diagnosis not present

## 2024-03-16 DIAGNOSIS — Z23 Encounter for immunization: Secondary | ICD-10-CM | POA: Diagnosis not present

## 2024-03-16 DIAGNOSIS — N186 End stage renal disease: Secondary | ICD-10-CM | POA: Diagnosis not present

## 2024-03-19 DIAGNOSIS — N2581 Secondary hyperparathyroidism of renal origin: Secondary | ICD-10-CM | POA: Diagnosis not present

## 2024-03-19 DIAGNOSIS — N186 End stage renal disease: Secondary | ICD-10-CM | POA: Diagnosis not present

## 2024-03-19 DIAGNOSIS — Z992 Dependence on renal dialysis: Secondary | ICD-10-CM | POA: Diagnosis not present

## 2024-03-21 DIAGNOSIS — N186 End stage renal disease: Secondary | ICD-10-CM | POA: Diagnosis not present

## 2024-03-21 DIAGNOSIS — Z992 Dependence on renal dialysis: Secondary | ICD-10-CM | POA: Diagnosis not present

## 2024-03-21 DIAGNOSIS — N2581 Secondary hyperparathyroidism of renal origin: Secondary | ICD-10-CM | POA: Diagnosis not present

## 2024-03-23 DIAGNOSIS — N2581 Secondary hyperparathyroidism of renal origin: Secondary | ICD-10-CM | POA: Diagnosis not present

## 2024-03-23 DIAGNOSIS — N186 End stage renal disease: Secondary | ICD-10-CM | POA: Diagnosis not present

## 2024-03-23 DIAGNOSIS — Z992 Dependence on renal dialysis: Secondary | ICD-10-CM | POA: Diagnosis not present

## 2024-03-26 DIAGNOSIS — N2581 Secondary hyperparathyroidism of renal origin: Secondary | ICD-10-CM | POA: Diagnosis not present

## 2024-03-26 DIAGNOSIS — Z992 Dependence on renal dialysis: Secondary | ICD-10-CM | POA: Diagnosis not present

## 2024-03-26 DIAGNOSIS — N186 End stage renal disease: Secondary | ICD-10-CM | POA: Diagnosis not present

## 2024-03-28 DIAGNOSIS — N186 End stage renal disease: Secondary | ICD-10-CM | POA: Diagnosis not present

## 2024-03-28 DIAGNOSIS — N2581 Secondary hyperparathyroidism of renal origin: Secondary | ICD-10-CM | POA: Diagnosis not present

## 2024-03-28 DIAGNOSIS — Z992 Dependence on renal dialysis: Secondary | ICD-10-CM | POA: Diagnosis not present

## 2024-03-30 DIAGNOSIS — Z992 Dependence on renal dialysis: Secondary | ICD-10-CM | POA: Diagnosis not present

## 2024-03-30 DIAGNOSIS — N186 End stage renal disease: Secondary | ICD-10-CM | POA: Diagnosis not present

## 2024-03-30 DIAGNOSIS — N2581 Secondary hyperparathyroidism of renal origin: Secondary | ICD-10-CM | POA: Diagnosis not present

## 2024-04-02 DIAGNOSIS — N2581 Secondary hyperparathyroidism of renal origin: Secondary | ICD-10-CM | POA: Diagnosis not present

## 2024-04-02 DIAGNOSIS — Z992 Dependence on renal dialysis: Secondary | ICD-10-CM | POA: Diagnosis not present

## 2024-04-02 DIAGNOSIS — N186 End stage renal disease: Secondary | ICD-10-CM | POA: Diagnosis not present

## 2024-04-04 DIAGNOSIS — Z992 Dependence on renal dialysis: Secondary | ICD-10-CM | POA: Diagnosis not present

## 2024-04-04 DIAGNOSIS — N2581 Secondary hyperparathyroidism of renal origin: Secondary | ICD-10-CM | POA: Diagnosis not present

## 2024-04-04 DIAGNOSIS — N186 End stage renal disease: Secondary | ICD-10-CM | POA: Diagnosis not present

## 2024-04-06 DIAGNOSIS — Z992 Dependence on renal dialysis: Secondary | ICD-10-CM | POA: Diagnosis not present

## 2024-04-06 DIAGNOSIS — N186 End stage renal disease: Secondary | ICD-10-CM | POA: Diagnosis not present

## 2024-04-06 DIAGNOSIS — N2581 Secondary hyperparathyroidism of renal origin: Secondary | ICD-10-CM | POA: Diagnosis not present

## 2024-04-09 DIAGNOSIS — N2581 Secondary hyperparathyroidism of renal origin: Secondary | ICD-10-CM | POA: Diagnosis not present

## 2024-04-09 DIAGNOSIS — Z992 Dependence on renal dialysis: Secondary | ICD-10-CM | POA: Diagnosis not present

## 2024-04-09 DIAGNOSIS — N186 End stage renal disease: Secondary | ICD-10-CM | POA: Diagnosis not present

## 2024-04-11 ENCOUNTER — Encounter: Payer: Self-pay | Admitting: Family Medicine

## 2024-04-11 ENCOUNTER — Telehealth (INDEPENDENT_AMBULATORY_CARE_PROVIDER_SITE_OTHER): Admitting: Family Medicine

## 2024-04-11 VITALS — BP 137/72 | Ht 70.0 in

## 2024-04-11 DIAGNOSIS — J431 Panlobular emphysema: Secondary | ICD-10-CM | POA: Diagnosis not present

## 2024-04-11 DIAGNOSIS — J208 Acute bronchitis due to other specified organisms: Secondary | ICD-10-CM

## 2024-04-11 DIAGNOSIS — N186 End stage renal disease: Secondary | ICD-10-CM | POA: Diagnosis not present

## 2024-04-11 DIAGNOSIS — N2581 Secondary hyperparathyroidism of renal origin: Secondary | ICD-10-CM | POA: Diagnosis not present

## 2024-04-11 DIAGNOSIS — Z992 Dependence on renal dialysis: Secondary | ICD-10-CM | POA: Diagnosis not present

## 2024-04-11 MED ORDER — DOXYCYCLINE HYCLATE 100 MG PO TABS: 100.0000 mg | ORAL_TABLET | Freq: Two times a day (BID) | ORAL | 0 refills | Status: DC

## 2024-04-11 NOTE — Progress Notes (Signed)
 David Derhammer T. David Hogans, MD, CAQ Sports Medicine [facility] [address] [place], [address]  Phone: [phone]  FAX: [phone]  David Cowan - 63 y.o. male  MRN [id number]  Date of Birth: 24-Mar-1961  Date: 04/11/2024  PCP: Wendee Lynwood HERO, NP  Referral: Wendee Lynwood HERO, NP  Chief Complaint  Patient presents with   Nasal Congestion    X 2 weeks   Virtual Visit via Video Note:  I connected with  David Cowan on 04/11/2024  9:20 AM EDT by a video enabled telemedicine application and verified that I am speaking with the correct person using two identifiers.   Location patient: home computer, tablet, or smartphone Location provider: work or home office Consent: Verbal consent directly obtained from David Cowan. Persons participating in the virtual visit: patient, provider  I discussed the limitations of evaluation and management by telemedicine and the availability of in person appointments. The patient expressed understanding and agreed to proceed.  Chief Complaint  Patient presents with   Nasal Congestion    X 2 weeks    History of Present Illness:  Discussed the use of AI scribe software for clinical note transcription with the patient, who gave verbal consent to proceed.   History of Present Illness David Cowan is a 63 year old male with COPD and end-stage renal disease on hemodialysis who presents with persistent respiratory symptoms.  He has been experiencing congestion and a stuffy head for the past couple of weeks. Initially, he took Mucinex  without relief in the first three to four days. His wife then obtained chloraseptic and HBP, which began to alleviate his symptoms. However, he continues to cough and expel mucus. No fever is reported, but he feels unwell.  He has a history of smoking but quit two years ago after being diagnosed with end-stage renal disease. He mentions eating better and avoiding dark sodas. He  has not been diagnosed with asthma but has been told he has COPD, which is confirmed with his recent CT chest results.  He is due for an annual lung screening but has not scheduled it yet.  He has not taken a COVID or flu test at home but received flu and pneumonia vaccines recently. He is on hemodialysis three times a week (Monday, Wednesday, Friday) and reports feeling cold during the last thirty minutes to an hour of dialysis, requiring a blanket to warm up.     Review of Systems as above: See pertinent positives and pertinent negatives per HPI No acute distress verbally   Observations/Objective/Exam:  An attempt was made to discern vital signs over the phone and per patient if applicable and possible.   General:    Alert, Oriented, appears well and in no acute distress  Pulmonary:     On inspection no signs of respiratory distress.  Psych / Neurological:     Pleasant and cooperative.  Assessment and Plan:    ICD-10-CM   1. Acute bronchitis due to other specified organisms  J20.8     2. Panlobular emphysema (HCC)  J43.1     3. ESRD (end stage renal disease) (HCC)  N18.6      2 weeks of bronchitis in the setting of COPD exacerbation with panlobular emphysema and end-stage renal disease.  I reviewed dosing with end-stage renal disease, and there are no changes.  I discussed the assessment and treatment plan with the patient. The patient was provided an opportunity  to ask questions and all were answered. The patient agreed with the plan and demonstrated an understanding of the instructions.   The patient was advised to call back or seek an in-person evaluation if the symptoms worsen or if the condition fails to improve as anticipated.  Follow-up: prn unless noted otherwise below No follow-ups on file.  Meds ordered this encounter  Medications   doxycycline  (VIBRA -TABS) 100 MG tablet    Sig: Take 1 tablet (100 mg total) by mouth 2 (two) times daily.    Dispense:  20  tablet    Refill:  0   No orders of the defined types were placed in this encounter.   Signed,  Jacques DASEN. Caylah Plouff, MD

## 2024-04-13 DIAGNOSIS — Z992 Dependence on renal dialysis: Secondary | ICD-10-CM | POA: Diagnosis not present

## 2024-04-13 DIAGNOSIS — N186 End stage renal disease: Secondary | ICD-10-CM | POA: Diagnosis not present

## 2024-04-13 DIAGNOSIS — I120 Hypertensive chronic kidney disease with stage 5 chronic kidney disease or end stage renal disease: Secondary | ICD-10-CM | POA: Diagnosis not present

## 2024-04-13 DIAGNOSIS — N2581 Secondary hyperparathyroidism of renal origin: Secondary | ICD-10-CM | POA: Diagnosis not present

## 2024-04-16 DIAGNOSIS — Z992 Dependence on renal dialysis: Secondary | ICD-10-CM | POA: Diagnosis not present

## 2024-04-16 DIAGNOSIS — N2581 Secondary hyperparathyroidism of renal origin: Secondary | ICD-10-CM | POA: Diagnosis not present

## 2024-04-16 DIAGNOSIS — N186 End stage renal disease: Secondary | ICD-10-CM | POA: Diagnosis not present

## 2024-04-18 DIAGNOSIS — N186 End stage renal disease: Secondary | ICD-10-CM | POA: Diagnosis not present

## 2024-04-18 DIAGNOSIS — Z992 Dependence on renal dialysis: Secondary | ICD-10-CM | POA: Diagnosis not present

## 2024-04-18 DIAGNOSIS — N2581 Secondary hyperparathyroidism of renal origin: Secondary | ICD-10-CM | POA: Diagnosis not present

## 2024-04-20 ENCOUNTER — Telehealth: Payer: Self-pay | Admitting: *Deleted

## 2024-04-20 ENCOUNTER — Ambulatory Visit (INDEPENDENT_AMBULATORY_CARE_PROVIDER_SITE_OTHER)
Admission: RE | Admit: 2024-04-20 | Discharge: 2024-04-20 | Disposition: A | Discharge status: Home / Self Care | Source: Ambulatory Visit | Attending: Nurse Practitioner | Admitting: Nurse Practitioner

## 2024-04-20 ENCOUNTER — Ambulatory Visit (INDEPENDENT_AMBULATORY_CARE_PROVIDER_SITE_OTHER): Admitting: Nurse Practitioner

## 2024-04-20 VITALS — BP 124/80 | HR 60 | Temp 98.0°F | Ht 70.0 in | Wt 237.8 lb

## 2024-04-20 DIAGNOSIS — Z125 Encounter for screening for malignant neoplasm of prostate: Secondary | ICD-10-CM

## 2024-04-20 DIAGNOSIS — J439 Emphysema, unspecified: Secondary | ICD-10-CM | POA: Diagnosis not present

## 2024-04-20 DIAGNOSIS — N186 End stage renal disease: Secondary | ICD-10-CM

## 2024-04-20 DIAGNOSIS — I2510 Atherosclerotic heart disease of native coronary artery without angina pectoris: Secondary | ICD-10-CM

## 2024-04-20 DIAGNOSIS — R059 Cough, unspecified: Secondary | ICD-10-CM | POA: Diagnosis not present

## 2024-04-20 DIAGNOSIS — R051 Acute cough: Secondary | ICD-10-CM

## 2024-04-20 DIAGNOSIS — I10 Essential (primary) hypertension: Secondary | ICD-10-CM

## 2024-04-20 DIAGNOSIS — E785 Hyperlipidemia, unspecified: Secondary | ICD-10-CM

## 2024-04-20 DIAGNOSIS — Z0000 Encounter for general adult medical examination without abnormal findings: Secondary | ICD-10-CM

## 2024-04-20 DIAGNOSIS — E669 Obesity, unspecified: Secondary | ICD-10-CM

## 2024-04-20 DIAGNOSIS — N2581 Secondary hyperparathyroidism of renal origin: Secondary | ICD-10-CM | POA: Diagnosis not present

## 2024-04-20 DIAGNOSIS — Z87891 Personal history of nicotine dependence: Secondary | ICD-10-CM | POA: Diagnosis not present

## 2024-04-20 DIAGNOSIS — Z992 Dependence on renal dialysis: Secondary | ICD-10-CM | POA: Diagnosis not present

## 2024-04-20 DIAGNOSIS — R0989 Other specified symptoms and signs involving the circulatory and respiratory systems: Secondary | ICD-10-CM | POA: Diagnosis not present

## 2024-04-20 LAB — CBC WITH DIFFERENTIAL/PLATELET
Basophils Absolute: 0.1 K/uL (ref 0.0–0.1)
Basophils Relative: 1.2 % (ref 0.0–3.0)
Eosinophils Absolute: 0.5 K/uL (ref 0.0–0.7)
Eosinophils Relative: 5.9 % — ABNORMAL HIGH (ref 0.0–5.0)
HCT: 32.6 % — ABNORMAL LOW (ref 39.0–52.0)
Hemoglobin: 10.6 g/dL — ABNORMAL LOW (ref 13.0–17.0)
Lymphocytes Relative: 20.4 % (ref 12.0–46.0)
Lymphs Abs: 1.7 K/uL (ref 0.7–4.0)
MCHC: 32.5 g/dL (ref 30.0–36.0)
MCV: 96.4 fl (ref 78.0–100.0)
Monocytes Absolute: 0.8 K/uL (ref 0.1–1.0)
Monocytes Relative: 10.2 % (ref 3.0–12.0)
Neutro Abs: 5.2 K/uL (ref 1.4–7.7)
Neutrophils Relative %: 62.3 % (ref 43.0–77.0)
Platelets: 236 K/uL (ref 150.0–400.0)
RBC: 3.39 Mil/uL — ABNORMAL LOW (ref 4.22–5.81)
RDW: 15.6 % — ABNORMAL HIGH (ref 11.5–15.5)
WBC: 8.3 K/uL (ref 4.0–10.5)

## 2024-04-20 LAB — COMPREHENSIVE METABOLIC PANEL WITH GFR
ALT: 14 U/L (ref 0–53)
AST: 23 U/L (ref 0–37)
Albumin: 3.2 g/dL — ABNORMAL LOW (ref 3.5–5.2)
Alkaline Phosphatase: 48 U/L (ref 39–117)
BUN: 42 mg/dL — ABNORMAL HIGH (ref 6–23)
CO2: 32 meq/L (ref 19–32)
Calcium: 8 mg/dL — ABNORMAL LOW (ref 8.4–10.5)
Chloride: 98 meq/L (ref 96–112)
Creatinine, Ser: 4.52 mg/dL (ref 0.40–1.50)
GFR: 13.11 mL/min — CL (ref >60.00)
Glucose, Bld: 70 mg/dL (ref 70–99)
Potassium: 4 meq/L (ref 3.5–5.1)
Sodium: 141 meq/L (ref 135–145)
Total Bilirubin: 0.4 mg/dL (ref 0.2–1.2)
Total Protein: 5.9 g/dL — ABNORMAL LOW (ref 6.0–8.3)

## 2024-04-20 LAB — LIPID PANEL
Cholesterol: 43 mg/dL (ref 0–200)
HDL: 24.2 mg/dL — ABNORMAL LOW (ref >39.00)
LDL Cholesterol: 4 mg/dL (ref 0–99)
NonHDL: 18.88
Total CHOL/HDL Ratio: 2
Triglycerides: 72 mg/dL (ref 0.0–149.0)
VLDL: 14.4 mg/dL (ref 0.0–40.0)

## 2024-04-20 LAB — HEMOGLOBIN A1C: Hgb A1c MFr Bld: 5 % (ref 4.6–6.5)

## 2024-04-20 LAB — URINALYSIS, MICROSCOPIC ONLY

## 2024-04-20 LAB — PSA: PSA: 2.46 ng/mL (ref 0.10–4.00)

## 2024-04-20 LAB — TSH: TSH: 2.09 u[IU]/mL (ref 0.35–5.50)

## 2024-04-20 NOTE — Assessment & Plan Note (Signed)
 Patient currently maintained on furosemide every Tuesday, Thursday, Saturday.  Patient is currently on metoprolol  100 mg daily

## 2024-04-20 NOTE — Assessment & Plan Note (Signed)
 Followed by nephrology.  Patient currently maintained on hemodialysis.  Continue

## 2024-04-20 NOTE — Progress Notes (Signed)
 /  Established Patient Office Visit  Subjective   Patient ID: David Cowan, male    DOB: February 18, 1961  Age: 63 y.o. MRN: [id number]  Chief Complaint  Patient presents with   Annual Exam    HPI  COPD: Patient currently maintained on albuterol  inhaler as needed. States that he was not using it unitl 2 weeks ago and now he is having shob. Je was seen by Dr. Watt on 04/11/2024 and was started on doxycyline.   HLD: Patient currently maintained on evolocumab , rosuvastatin  40 mg daily, aspirin  81 mg daily.  ESRD: on hemo dialysis and awaiting transplant. He does M, W, F dialysis followed by Gearline, MD nephrologist  CAD: Currently maintained on evolocumab , rosuvastatin , aspirin , clopidogrel .  Patient underwent heart catheterization on 02/03/2024 with stent placement.  Currently being followed by cardiology.  for complete physical and follow up of chronic conditions.  Immunizations: -Tetanus: Completed in 2024 -Influenza: 03/16/2024 -Shingles: has had shingles in 6 months will get patient in 6 months -Pneumonia: Completed 2025  Diet: Fair diet. He is eating 2 meals a day.  Exercise: No regular exercise.  Eye exam: Completes annually.  Doing better glasses Dental exam: Completes semi-annually    Colonoscopy: Completed in 05/31/2014, repeat 10 years.  Patient due 2025. On plavix  and waiting  Lung Cancer Screening: Completed in 04/21/2023 with recommendations repeating in 12 months.  Patient due  PSA: Due  Sleep: going to bed aroudn 04-1129 and get up aroudn 7-730. Feels rested. He does get up 1-2 times a night to pee.       Review of Systems  Constitutional:  Negative for chills and fever.  Respiratory:  Negative for shortness of breath.   Cardiovascular:  Negative for chest pain and leg swelling.  Gastrointestinal:  Negative for abdominal pain, blood in stool, constipation, diarrhea, nausea and vomiting.  Genitourinary:  Negative for dysuria and hematuria.  Neurological:   Negative for tingling and headaches.  Psychiatric/Behavioral:  Negative for hallucinations and suicidal ideas.       Objective:     BP 124/80   Pulse 60   Temp 98 F (36.7 C) (Oral)   Ht 5' 10 (1.778 m)   Wt 237 lb 12.8 oz (107.9 kg)   SpO2 98%   BMI 34.12 kg/m    Physical Exam Vitals and nursing note reviewed.  Constitutional:      Appearance: Normal appearance.  HENT:     Right Ear: Tympanic membrane, ear canal and external ear normal.     Left Ear: Tympanic membrane, ear canal and external ear normal.     Mouth/Throat:     Mouth: Mucous membranes are moist.     Pharynx: Oropharynx is clear.  Eyes:     Extraocular Movements: Extraocular movements intact.     Pupils: Pupils are equal, round, and reactive to light.  Cardiovascular:     Rate and Rhythm: Normal rate and regular rhythm.     Pulses: Normal pulses.     Heart sounds: Normal heart sounds.  Pulmonary:     Effort: Pulmonary effort is normal.     Breath sounds: Normal breath sounds.  Abdominal:     General: Bowel sounds are normal. There is no distension.     Palpations: There is no mass.     Tenderness: There is no abdominal tenderness.     Hernia: No hernia is present.  Musculoskeletal:       Arms:     Right lower leg: Edema present.  Left lower leg: Edema present.  Lymphadenopathy:     Cervical: No cervical adenopathy.  Skin:    General: Skin is warm.  Neurological:     General: No focal deficit present.     Mental Status: He is alert.     Deep Tendon Reflexes:     Reflex Scores:      Bicep reflexes are 2+ on the right side and 2+ on the left side.      Patellar reflexes are 2+ on the right side and 2+ on the left side.    Comments: Bilateral upper and lower extremity strength 5/5  Psychiatric:        Mood and Affect: Mood normal.        Behavior: Behavior normal.        Thought Content: Thought content normal.        Judgment: Judgment normal.      No results found for any visits on  04/20/24.    The ASCVD Risk score (Arnett DK, et al., 2019) failed to calculate for the following reasons:   Risk score cannot be calculated because patient has a medical history suggesting prior/existing ASCVD    Assessment & Plan:   Problem List Items Addressed This Visit       Cardiovascular and Mediastinum   Essential hypertension - Primary   Patient currently maintained on furosemide every Tuesday, Thursday, Saturday.  Patient is currently on metoprolol  100 mg daily      Relevant Orders   CBC with Differential/Platelet   Comprehensive metabolic panel with GFR   TSH   Coronary atherosclerosis   Followed by cardiology currently maintained on evolocumab , rosuvastatin  and baby aspirin       Relevant Orders   Hemoglobin A1c   Lipid panel     Respiratory   Chronic obstructive pulmonary disease (HCC)   Maintained on albuterol  inhaler as needed.  Patient currently in exacerbation.  No wheezing noted on exam        Genitourinary   ESRD (end stage renal disease) (HCC)   Followed by nephrology.  Patient currently maintained on hemodialysis.  Continue        Other   Hyperlipidemia   Currently maintained on evolocumab , rosuvastatin .  Followed by cardiology.  Pending lipid panel today      Relevant Orders   Hemoglobin A1c   Lipid panel   Preventative health care   Discussed age-appropriate immunizations and screening exams.  Did review patient's personal, surgical, social, family histories.  Patient is up-to-date with all age-appropriate vaccinations he would like.  Patient is due for CRC screening.  They would like a new patient consult for Plavix  he is currently working with cardiology in that regard.  PSA today for prostate cancer screening.  Patient was given information at discharge about preventative healthcare maintenance with anticipatory guidance.      Relevant Orders   CBC with Differential/Platelet   Comprehensive metabolic panel with GFR   TSH   Obesity  (BMI 30-39.9)   Pending TSH, A1c, lipid panel.      Acute cough   Was evaluated by telehealth started on doxycycline .  Patient has finished the medication without great improvement.  Decreased breath sounds and rhonchi pending chest x-ray today.      Relevant Orders   DG Chest 2 View (Completed)   Former tobacco use   Pending microscopic urinalysis to rule out microscopic hematuria in the setting for tobacco abuse      Relevant Orders  Urine Microscopic   Other Visit Diagnoses       Screening for prostate cancer       Relevant Orders   PSA       Return in about 6 months (around 10/18/2024) for CKD, shingles vaccine .    Adina Crandall, NP

## 2024-04-20 NOTE — Patient Instructions (Signed)
Nice to see you today I will be in touch with the labs once I have them Follow up with me in 6 months

## 2024-04-20 NOTE — Assessment & Plan Note (Signed)
 Maintained on albuterol  inhaler as needed.  Patient currently in exacerbation.  No wheezing noted on exam

## 2024-04-20 NOTE — Telephone Encounter (Signed)
 Saa with [facility] called with Critical labs  Pt has a Creatinine of 4.52 and GFR of 13.11  Results given to PCP

## 2024-04-20 NOTE — Assessment & Plan Note (Signed)
 Discussed age-appropriate immunizations and screening exams.  Did review patient's personal, surgical, social, family histories.  Patient is up-to-date with all age-appropriate vaccinations he would like.  Patient is due for CRC screening.  They would like a new patient consult for Plavix  he is currently working with cardiology in that regard.  PSA today for prostate cancer screening.  Patient was given information at discharge about preventative healthcare maintenance with anticipatory guidance.

## 2024-04-20 NOTE — Telephone Encounter (Signed)
 Noted. Patient has ESRD and is on hemodialysis

## 2024-04-20 NOTE — Assessment & Plan Note (Signed)
 Pending microscopic urinalysis to rule out microscopic hematuria in the setting for tobacco abuse

## 2024-04-20 NOTE — Assessment & Plan Note (Signed)
 Currently maintained on evolocumab , rosuvastatin .  Followed by cardiology.  Pending lipid panel today

## 2024-04-20 NOTE — Assessment & Plan Note (Signed)
 Followed by cardiology currently maintained on evolocumab , rosuvastatin  and baby aspirin 

## 2024-04-20 NOTE — Assessment & Plan Note (Signed)
 Pending TSH, A1c, lipid panel.

## 2024-04-20 NOTE — Assessment & Plan Note (Signed)
 Was evaluated by telehealth started on doxycycline .  Patient has finished the medication without great improvement.  Decreased breath sounds and rhonchi pending chest x-ray today.

## 2024-04-23 ENCOUNTER — Telehealth: Payer: Self-pay | Admitting: Cardiovascular Disease

## 2024-04-23 ENCOUNTER — Other Ambulatory Visit: Payer: Self-pay | Admitting: Cardiovascular Disease

## 2024-04-23 DIAGNOSIS — E785 Hyperlipidemia, unspecified: Secondary | ICD-10-CM

## 2024-04-23 DIAGNOSIS — N186 End stage renal disease: Secondary | ICD-10-CM | POA: Diagnosis not present

## 2024-04-23 DIAGNOSIS — Z992 Dependence on renal dialysis: Secondary | ICD-10-CM | POA: Diagnosis not present

## 2024-04-23 DIAGNOSIS — I10 Essential (primary) hypertension: Secondary | ICD-10-CM

## 2024-04-23 DIAGNOSIS — I2510 Atherosclerotic heart disease of native coronary artery without angina pectoris: Secondary | ICD-10-CM

## 2024-04-23 DIAGNOSIS — N2581 Secondary hyperparathyroidism of renal origin: Secondary | ICD-10-CM | POA: Diagnosis not present

## 2024-04-23 NOTE — Telephone Encounter (Signed)
 Pt called in asking if someone can call him and/or his wife about his bill. They have a couple questions/concerns.

## 2024-04-24 ENCOUNTER — Ambulatory Visit: Payer: Self-pay | Admitting: Nurse Practitioner

## 2024-04-24 DIAGNOSIS — R3129 Other microscopic hematuria: Secondary | ICD-10-CM

## 2024-04-24 DIAGNOSIS — Z87891 Personal history of nicotine dependence: Secondary | ICD-10-CM

## 2024-04-25 DIAGNOSIS — Z992 Dependence on renal dialysis: Secondary | ICD-10-CM | POA: Diagnosis not present

## 2024-04-25 DIAGNOSIS — N2581 Secondary hyperparathyroidism of renal origin: Secondary | ICD-10-CM | POA: Diagnosis not present

## 2024-04-25 DIAGNOSIS — N186 End stage renal disease: Secondary | ICD-10-CM | POA: Diagnosis not present

## 2024-04-27 DIAGNOSIS — N186 End stage renal disease: Secondary | ICD-10-CM | POA: Diagnosis not present

## 2024-04-27 DIAGNOSIS — N2581 Secondary hyperparathyroidism of renal origin: Secondary | ICD-10-CM | POA: Diagnosis not present

## 2024-04-27 DIAGNOSIS — Z992 Dependence on renal dialysis: Secondary | ICD-10-CM | POA: Diagnosis not present

## 2024-04-30 ENCOUNTER — Other Ambulatory Visit (HOSPITAL_COMMUNITY): Payer: Self-pay

## 2024-04-30 DIAGNOSIS — N186 End stage renal disease: Secondary | ICD-10-CM | POA: Diagnosis not present

## 2024-04-30 DIAGNOSIS — N2581 Secondary hyperparathyroidism of renal origin: Secondary | ICD-10-CM | POA: Diagnosis not present

## 2024-04-30 DIAGNOSIS — Z992 Dependence on renal dialysis: Secondary | ICD-10-CM | POA: Diagnosis not present

## 2024-04-30 DIAGNOSIS — Z23 Encounter for immunization: Secondary | ICD-10-CM | POA: Diagnosis not present

## 2024-05-02 ENCOUNTER — Other Ambulatory Visit (HOSPITAL_COMMUNITY): Payer: Self-pay

## 2024-05-02 ENCOUNTER — Other Ambulatory Visit: Payer: Self-pay

## 2024-05-02 DIAGNOSIS — Z23 Encounter for immunization: Secondary | ICD-10-CM | POA: Diagnosis not present

## 2024-05-02 DIAGNOSIS — Z992 Dependence on renal dialysis: Secondary | ICD-10-CM | POA: Diagnosis not present

## 2024-05-02 DIAGNOSIS — N2581 Secondary hyperparathyroidism of renal origin: Secondary | ICD-10-CM | POA: Diagnosis not present

## 2024-05-02 DIAGNOSIS — N186 End stage renal disease: Secondary | ICD-10-CM | POA: Diagnosis not present

## 2024-05-04 ENCOUNTER — Ambulatory Visit: Payer: Self-pay | Admitting: Urology

## 2024-05-04 ENCOUNTER — Ambulatory Visit (INDEPENDENT_AMBULATORY_CARE_PROVIDER_SITE_OTHER): Admitting: Urology

## 2024-05-04 VITALS — BP 157/72 | HR 70 | Ht 70.0 in | Wt 235.0 lb

## 2024-05-04 DIAGNOSIS — N2581 Secondary hyperparathyroidism of renal origin: Secondary | ICD-10-CM | POA: Diagnosis not present

## 2024-05-04 DIAGNOSIS — Z992 Dependence on renal dialysis: Secondary | ICD-10-CM | POA: Diagnosis not present

## 2024-05-04 DIAGNOSIS — R3129 Other microscopic hematuria: Secondary | ICD-10-CM

## 2024-05-04 DIAGNOSIS — Z125 Encounter for screening for malignant neoplasm of prostate: Secondary | ICD-10-CM

## 2024-05-04 DIAGNOSIS — N186 End stage renal disease: Secondary | ICD-10-CM | POA: Diagnosis not present

## 2024-05-04 DIAGNOSIS — Z23 Encounter for immunization: Secondary | ICD-10-CM | POA: Diagnosis not present

## 2024-05-04 LAB — MICROSCOPIC EXAMINATION

## 2024-05-04 LAB — URINALYSIS, COMPLETE
Bilirubin, UA: NEGATIVE
Glucose, UA: NEGATIVE
Ketones, UA: NEGATIVE
Leukocytes,UA: NEGATIVE
Nitrite, UA: NEGATIVE
Specific Gravity, UA: 1.02 (ref 1.005–1.030)
Urobilinogen, Ur: 0.2 mg/dL (ref 0.2–1.0)
pH, UA: 6 (ref 5.0–7.5)

## 2024-05-04 NOTE — Progress Notes (Signed)
 05/04/24 8:58 AM   David Cowan 1960-10-06 [id number]  CC: Microscopic hematuria, PSA screening  HPI: 63 year old male with ESRD on dialysis started May 2025 is referred for microscopic hematuria.  Recent urine sample showed 3-6 RBC and 3-6 WBC, otherwise benign, was not sent for culture.  He denies any urinary symptoms.  He had a recent non-contrast CT from June 2025 that was benign.  He reports he had a cystoscopy 5 to 10 years ago in [place] which was negative, he would like to avoid a repeat cystoscopy if possible.  Urinalysis today is pending.  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   History of shingles    Hyperlipidemia    Hypertension    Myocardial infarction [facility]) 2002   Right flank pain     Surgical History: Past Surgical History:  Procedure Laterality Date   AV FISTULA PLACEMENT Right 08/30/2023   Procedure: ARTERIOVENOUS (AV) FISTULA CREATION;  Surgeon: Pearline Norman RAMAN, MD;  Location: [facility] OR;  Service: Vascular;  Laterality: Right;   CARDIAC CATHETERIZATION  07/21/2000   COLONOSCOPY     CORONARY LITHOTRIPSY N/A 02/02/2024   Procedure: CORONARY LITHOTRIPSY;  Surgeon: Darron Deatrice LABOR, MD;  Location: [facility] INVASIVE CV LAB;  Service: Cardiovascular;  Laterality: N/A;   CORONARY PRESSURE/FFR STUDY N/A 02/02/2024   Procedure: CORONARY PRESSURE/FFR STUDY;  Surgeon: Darron Deatrice LABOR, MD;  Location: [facility] INVASIVE CV LAB;  Service: Cardiovascular;  Laterality: N/A;   CORONARY STENT INTERVENTION N/A 02/02/2024   Procedure: CORONARY STENT INTERVENTION;  Surgeon: Darron Deatrice LABOR, MD;  Location: [facility] INVASIVE CV LAB;  Service: Cardiovascular;  Laterality: N/A;    JOINT REPLACEMENT  2017   hip replacement   LEFT HEART CATH AND CORONARY ANGIOGRAPHY N/A 02/02/2024   Procedure: LEFT HEART CATH AND CORONARY ANGIOGRAPHY;  Surgeon: Darron Deatrice LABOR, MD;  Location: [facility] INVASIVE CV LAB;  Service: Cardiovascular;  Laterality: N/A;   RENAL BIOPSY     TOTAL HIP ARTHROPLASTY Left 12/10/2014   Procedure: LEFT TOTAL HIP ARTHROPLASTY ANTERIOR APPROACH;  Surgeon: Donnice Car, MD;  Location: [facility] ORS;  Service: Orthopedics;  Laterality: Left;   VASECTOMY      Family History: Family History  Problem Relation Age of Onset   Congestive Heart Failure Mother 73   Lung cancer Mother    Heart attack Father 29   Factor V Leiden deficiency Sister    Factor V Leiden deficiency Brother    Coronary artery disease Other         fam hx, early   Heart attack Other 58       uncle   Colon cancer Neg Hx     Social History:  reports that he quit smoking about 1 years ago. His smoking use included cigarettes. He started smoking about 51 years ago. He has a 49 pack-year smoking history. He has never used smokeless tobacco. He reports that he does not currently use alcohol after a past usage of about 3.0 - 4.0 standard drinks of alcohol per week. He reports that he does not currently use  drugs after having used the following drugs: Marijuana.  Physical Exam: BP (!) 157/72 (BP Location: Left Arm, Patient Position: Sitting, Cuff Size: Normal)   Pulse 70   Ht 5' 10 (1.778 m)   Wt 235 lb (106.6 kg)   SpO2 96%   BMI 33.72 kg/m    Constitutional:  Alert and oriented, No acute distress. Cardiovascular: No clubbing, cyanosis, or edema. Respiratory: Normal respiratory effort, no increased work of breathing. GI: Abdomen is soft, nontender, nondistended, no abdominal masses   Laboratory Data: See HPI  Pertinent Imaging: CT abdomen and pelvis without contrast June 2025 with no urologic abnormalities  Assessment & Plan:   63 year old male with ESRD started on dialysis May 2025  referred for microscopic hematuria with 1 urine sample showing 3-6 RBC and 3-6 WBC.  Reassurance provided regarding recent normal PSA of 2.46.  We discussed common possible etiologies of microscopic hematuria including idiopathic, urolithiasis, medical renal disease, and malignancy. We discussed the new asymptomatic microscopic hematuria guidelines and risk categories of low, intermediate, and high risk that are based on age, risk factors like smoking, and degree of microscopic hematuria. We discussed work-up can range from repeat urinalysis, renal ultrasound and cystoscopy, to CT urogram and cystoscopy.    He would like to avoid cystoscopy if at all possible with his history of negative cystoscopy and discomfort from that procedure.  Ultimately, using shared decision making he opted to repeat a urinalysis today and if benign defer cystoscopy, however if persistent microscopic hematuria he was amenable to cystoscopy.  Risks and benefits discussed at length including missing possibly clinically significant pathology and malignancy.   Redell Burnet, MD 05/04/2024  [facility] [address] [place], [place] [address] [phone]

## 2024-05-04 NOTE — Patient Instructions (Signed)

## 2024-05-06 DIAGNOSIS — N2581 Secondary hyperparathyroidism of renal origin: Secondary | ICD-10-CM | POA: Diagnosis not present

## 2024-05-06 DIAGNOSIS — N186 End stage renal disease: Secondary | ICD-10-CM | POA: Diagnosis not present

## 2024-05-06 DIAGNOSIS — Z992 Dependence on renal dialysis: Secondary | ICD-10-CM | POA: Diagnosis not present

## 2024-05-07 ENCOUNTER — Other Ambulatory Visit: Payer: Self-pay | Admitting: Cardiovascular Disease

## 2024-05-07 LAB — CULTURE, URINE COMPREHENSIVE

## 2024-05-08 DIAGNOSIS — N186 End stage renal disease: Secondary | ICD-10-CM | POA: Diagnosis not present

## 2024-05-08 DIAGNOSIS — Z992 Dependence on renal dialysis: Secondary | ICD-10-CM | POA: Diagnosis not present

## 2024-05-08 DIAGNOSIS — N2581 Secondary hyperparathyroidism of renal origin: Secondary | ICD-10-CM | POA: Diagnosis not present

## 2024-05-11 DIAGNOSIS — N186 End stage renal disease: Secondary | ICD-10-CM | POA: Diagnosis not present

## 2024-05-11 DIAGNOSIS — N2581 Secondary hyperparathyroidism of renal origin: Secondary | ICD-10-CM | POA: Diagnosis not present

## 2024-05-11 DIAGNOSIS — Z992 Dependence on renal dialysis: Secondary | ICD-10-CM | POA: Diagnosis not present

## 2024-05-13 DIAGNOSIS — I120 Hypertensive chronic kidney disease with stage 5 chronic kidney disease or end stage renal disease: Secondary | ICD-10-CM | POA: Diagnosis not present

## 2024-05-13 DIAGNOSIS — N186 End stage renal disease: Secondary | ICD-10-CM | POA: Diagnosis not present

## 2024-05-13 DIAGNOSIS — Z992 Dependence on renal dialysis: Secondary | ICD-10-CM | POA: Diagnosis not present

## 2024-05-14 DIAGNOSIS — N186 End stage renal disease: Secondary | ICD-10-CM | POA: Diagnosis not present

## 2024-05-14 DIAGNOSIS — N2581 Secondary hyperparathyroidism of renal origin: Secondary | ICD-10-CM | POA: Diagnosis not present

## 2024-05-14 DIAGNOSIS — Z992 Dependence on renal dialysis: Secondary | ICD-10-CM | POA: Diagnosis not present

## 2024-05-16 ENCOUNTER — Inpatient Hospital Stay
Admission: RE | Admit: 2024-05-16 | Discharge: 2024-05-16 | Disposition: A | Source: Ambulatory Visit | Attending: Acute Care | Admitting: Acute Care

## 2024-05-16 ENCOUNTER — Other Ambulatory Visit: Payer: Self-pay | Admitting: Urology

## 2024-05-16 ENCOUNTER — Telehealth: Payer: Self-pay

## 2024-05-16 DIAGNOSIS — Z87891 Personal history of nicotine dependence: Secondary | ICD-10-CM

## 2024-05-16 DIAGNOSIS — Z122 Encounter for screening for malignant neoplasm of respiratory organs: Secondary | ICD-10-CM

## 2024-05-16 DIAGNOSIS — Z992 Dependence on renal dialysis: Secondary | ICD-10-CM | POA: Diagnosis not present

## 2024-05-16 DIAGNOSIS — N186 End stage renal disease: Secondary | ICD-10-CM | POA: Diagnosis not present

## 2024-05-16 DIAGNOSIS — N2581 Secondary hyperparathyroidism of renal origin: Secondary | ICD-10-CM | POA: Diagnosis not present

## 2024-05-16 MED ORDER — DIAZEPAM 10 MG PO TABS: 10.0000 mg | ORAL_TABLET | Freq: Once | ORAL | 0 refills | Status: AC | PRN

## 2024-05-16 NOTE — Telephone Encounter (Signed)
 Advised patient that Valium was sent into the pharmacy for cysto appointment, verified patient appointment day , date and time. Patient verbalized understanding of information given.  T.Ericson Nafziger LPN

## 2024-05-16 NOTE — Progress Notes (Signed)
 Valium sent for precystoscopy

## 2024-05-18 DIAGNOSIS — Z992 Dependence on renal dialysis: Secondary | ICD-10-CM | POA: Diagnosis not present

## 2024-05-18 DIAGNOSIS — N186 End stage renal disease: Secondary | ICD-10-CM | POA: Diagnosis not present

## 2024-05-18 DIAGNOSIS — N2581 Secondary hyperparathyroidism of renal origin: Secondary | ICD-10-CM | POA: Diagnosis not present

## 2024-05-21 DIAGNOSIS — Z992 Dependence on renal dialysis: Secondary | ICD-10-CM | POA: Diagnosis not present

## 2024-05-21 DIAGNOSIS — N2581 Secondary hyperparathyroidism of renal origin: Secondary | ICD-10-CM | POA: Diagnosis not present

## 2024-05-21 DIAGNOSIS — N186 End stage renal disease: Secondary | ICD-10-CM | POA: Diagnosis not present

## 2024-05-22 ENCOUNTER — Telehealth: Payer: Self-pay | Admitting: Acute Care

## 2024-05-22 NOTE — Telephone Encounter (Signed)
 Called patient to discuss results and ask about recent illness. LVM to call office and speak with staff.

## 2024-05-22 NOTE — Telephone Encounter (Signed)
 Call report received:  IMPRESSION: Lung-RADS 0, incomplete. Additional lung cancer screening CT images/or comparison to prior chest CT examinations is needed. Development of bilateral medial lower lobe airspace disease and endobronchial material. Either infection or aspiration. Recommend appropriate therapy and follow-up lung cancer screening CT in 6-8 weeks.   Aortic valvular calcifications. Consider echocardiography to evaluate for valvular dysfunction.   Aortic atherosclerosis (ICD10-I70.0), coronary artery atherosclerosis and emphysema (ICD10-J43.9).   These results will be called to the ordering clinician or representative by the Radiologist Assistant, and communication documented in the PACS or [employer].     Electronically Signed   By: Rockey Kilts M.D.   On: 05/21/2024 16:18

## 2024-05-23 ENCOUNTER — Other Ambulatory Visit: Payer: Self-pay

## 2024-05-23 DIAGNOSIS — N186 End stage renal disease: Secondary | ICD-10-CM | POA: Diagnosis not present

## 2024-05-23 DIAGNOSIS — N2581 Secondary hyperparathyroidism of renal origin: Secondary | ICD-10-CM | POA: Diagnosis not present

## 2024-05-23 DIAGNOSIS — R911 Solitary pulmonary nodule: Secondary | ICD-10-CM

## 2024-05-23 DIAGNOSIS — Z87891 Personal history of nicotine dependence: Secondary | ICD-10-CM

## 2024-05-23 DIAGNOSIS — Z992 Dependence on renal dialysis: Secondary | ICD-10-CM | POA: Diagnosis not present

## 2024-05-23 DIAGNOSIS — Z122 Encounter for screening for malignant neoplasm of respiratory organs: Secondary | ICD-10-CM

## 2024-05-23 MED ORDER — AZITHROMYCIN 250 MG PO TABS: ORAL_TABLET | ORAL | 0 refills | Status: AC

## 2024-05-23 NOTE — Addendum Note (Signed)
 Addended by: WENDEE LYNWOOD HERO on: 05/23/2024 12:58 PM   Modules accepted: Orders

## 2024-05-23 NOTE — Telephone Encounter (Signed)
 Called pt. No answer and Unable to leave voicemail.

## 2024-05-23 NOTE — Telephone Encounter (Signed)
 Spoke with patient and spouse. Reviewed recent Lung Results. Pt has had a deep cough. States sometimes its hard to cough up phlegm because it is so deep,  but other times he does have a productive cough. No fever or shortness of breath. Pt is in agreement to complete an 8 week follow up scan and it has been scheduled for 08/01/2024. Order placed.  Lynwood, I am forwarding results to your office per patient request for treatment. Spouse and patient advise they will follow up with your office to discuss antibiotics/treatment if warranted based on CT results.   Isaiah, RN

## 2024-05-23 NOTE — Telephone Encounter (Signed)
 Can we let the patient know that I did send in some antibiotics to treat for a lung infection per his CT. If he does not improve I want to see him in office   FYI to Dr. Darron concerning the aortic valvular calcifications

## 2024-05-24 ENCOUNTER — Ambulatory Visit: Admitting: Urology

## 2024-05-24 VITALS — BP 155/75 | HR 75 | Ht 70.0 in | Wt 235.0 lb

## 2024-05-24 DIAGNOSIS — R3129 Other microscopic hematuria: Secondary | ICD-10-CM

## 2024-05-24 MED ORDER — NITROFURANTOIN MONOHYD MACRO 100 MG PO CAPS
100.0000 mg | ORAL_CAPSULE | Freq: Once | ORAL | Status: AC
  Administered 2024-05-24: 100 mg via ORAL

## 2024-05-24 MED ORDER — LIDOCAINE HCL URETHRAL/MUCOSAL 2 % EX GEL
1.0000 | Freq: Once | CUTANEOUS | Status: AC
  Administered 2024-05-24: 1 via URETHRAL

## 2024-05-24 NOTE — Progress Notes (Signed)
 Cystoscopy Procedure Note:  Indication: Microscopic hematuria  Nitrofurantoin given for prophylaxis  After informed consent and discussion of the procedure and its risks, David Cowan was positioned and prepped in the standard fashion. Cystoscopy was performed with a flexible cystoscope. The urethra, bladder neck and entire bladder was visualized in a standard fashion. The prostate was moderate to large in size with a small median lobe. The ureteral orifices were visualized in their normal location and orientation.  Bladder mucosa grossly normal throughout, no abnormalities on retroflexion.  Imaging: CT June 2025 benign  Findings: Normal cystoscopy  Assessment and Plan: Follow-up with urology as needed  Redell Burnet, MD 05/24/2024

## 2024-05-25 DIAGNOSIS — N186 End stage renal disease: Secondary | ICD-10-CM | POA: Diagnosis not present

## 2024-05-25 DIAGNOSIS — Z992 Dependence on renal dialysis: Secondary | ICD-10-CM | POA: Diagnosis not present

## 2024-05-25 DIAGNOSIS — N2581 Secondary hyperparathyroidism of renal origin: Secondary | ICD-10-CM | POA: Diagnosis not present

## 2024-05-25 NOTE — Telephone Encounter (Signed)
 Left voicemail for patient to call the office back.

## 2024-05-25 NOTE — Telephone Encounter (Signed)
 Aortic valve calcifications are expected given his history.  There was no evidence of aortic stenosis by cardiac catheterization in August.

## 2024-05-28 DIAGNOSIS — Z992 Dependence on renal dialysis: Secondary | ICD-10-CM | POA: Diagnosis not present

## 2024-05-28 DIAGNOSIS — N2581 Secondary hyperparathyroidism of renal origin: Secondary | ICD-10-CM | POA: Diagnosis not present

## 2024-05-28 DIAGNOSIS — N186 End stage renal disease: Secondary | ICD-10-CM | POA: Diagnosis not present

## 2024-05-28 NOTE — Telephone Encounter (Signed)
 Called and spoke with patient. Pt complains that symptoms have improved some.  States of still having a cough.  Pt took last of antibiotics yesterday.  States that he will hold off on an appointment and call if symptoms stop improving or worsens.

## 2024-05-28 NOTE — Telephone Encounter (Signed)
 noted

## 2024-05-30 DIAGNOSIS — N2581 Secondary hyperparathyroidism of renal origin: Secondary | ICD-10-CM | POA: Diagnosis not present

## 2024-05-30 DIAGNOSIS — Z992 Dependence on renal dialysis: Secondary | ICD-10-CM | POA: Diagnosis not present

## 2024-05-30 DIAGNOSIS — N186 End stage renal disease: Secondary | ICD-10-CM | POA: Diagnosis not present

## 2024-06-01 DIAGNOSIS — N2581 Secondary hyperparathyroidism of renal origin: Secondary | ICD-10-CM | POA: Diagnosis not present

## 2024-06-01 DIAGNOSIS — N186 End stage renal disease: Secondary | ICD-10-CM | POA: Diagnosis not present

## 2024-06-01 DIAGNOSIS — Z992 Dependence on renal dialysis: Secondary | ICD-10-CM | POA: Diagnosis not present

## 2024-06-03 DIAGNOSIS — Z992 Dependence on renal dialysis: Secondary | ICD-10-CM | POA: Diagnosis not present

## 2024-06-03 DIAGNOSIS — N2581 Secondary hyperparathyroidism of renal origin: Secondary | ICD-10-CM | POA: Diagnosis not present

## 2024-06-03 DIAGNOSIS — N186 End stage renal disease: Secondary | ICD-10-CM | POA: Diagnosis not present

## 2024-06-05 DIAGNOSIS — N186 End stage renal disease: Secondary | ICD-10-CM | POA: Diagnosis not present

## 2024-06-05 DIAGNOSIS — Z992 Dependence on renal dialysis: Secondary | ICD-10-CM | POA: Diagnosis not present

## 2024-06-05 DIAGNOSIS — N2581 Secondary hyperparathyroidism of renal origin: Secondary | ICD-10-CM | POA: Diagnosis not present

## 2024-06-08 DIAGNOSIS — Z992 Dependence on renal dialysis: Secondary | ICD-10-CM | POA: Diagnosis not present

## 2024-06-08 DIAGNOSIS — N2581 Secondary hyperparathyroidism of renal origin: Secondary | ICD-10-CM | POA: Diagnosis not present

## 2024-06-08 DIAGNOSIS — N186 End stage renal disease: Secondary | ICD-10-CM | POA: Diagnosis not present

## 2024-06-13 DIAGNOSIS — I120 Hypertensive chronic kidney disease with stage 5 chronic kidney disease or end stage renal disease: Secondary | ICD-10-CM | POA: Diagnosis not present

## 2024-06-13 DIAGNOSIS — Z992 Dependence on renal dialysis: Secondary | ICD-10-CM | POA: Diagnosis not present

## 2024-06-13 DIAGNOSIS — N186 End stage renal disease: Secondary | ICD-10-CM | POA: Diagnosis not present

## 2024-06-21 ENCOUNTER — Telehealth: Payer: Self-pay | Admitting: *Deleted

## 2024-06-21 NOTE — Telephone Encounter (Signed)
 Patient is on wait list for colonoscopy to be completed at the hospital setting with Dr Stacia at this time.  Patient was seen 01/02/24 by Ellouise Console, PA-C and hospital colonoscopy was ordered for screening purposes (patient with ESRD awaiting kidney transplant) to be completed 03/13/24.  Unfortunately, patient had cardiac catheterization 02/02/24 and required drug-eluting stent with plavix  maintenance thereafter. 03/13/24 procedure cancelled.  Dr Stacia-  Now that patient is 6 months out from cardiac stent placement as of 08/04/24, would you be agreeable to doing hospital colonoscopy directly pending cardiology clearance and anticoagulation clearance or would you prefer to see him back in the office again for reassessment first?

## 2024-06-25 ENCOUNTER — Telehealth: Payer: Self-pay | Admitting: *Deleted

## 2024-06-25 NOTE — Telephone Encounter (Signed)
 Request for surgical clearance:     Endoscopy Procedure  What type of surgery is being performed?     colonoscopy  When is this surgery scheduled?     TBD (but AFTER 08/04/24)  What type of clearance is required ?   CARDIAC AND ANTICOAGULATION  Are there any medications that need to be held prior to surgery and how long? Plavix , 5 days  Practice name and name of physician performing surgery?      [facility]  What is your office phone and fax number?      Phone- [phone]  Fax- [phone]  Anesthesia type (None, local, MAC, general) ?       MAC  Please route your response to Naomie Sharps, RN  Thank you!!

## 2024-06-25 NOTE — Telephone Encounter (Signed)
" ° °  Name: David Cowan  DOB: December 19, 1960  MRN: [id number]  Primary Cardiologist: Danelle Birmingham, MD   Preoperative team, please contact this patient and set up a phone call appointment for further preoperative risk assessment. Please obtain consent and complete medication review. Thank you for your help.  I confirm that guidance regarding antiplatelet and oral anticoagulation therapy has been completed and, if necessary, noted below.  Patient cannot hold ASA or Plavix  for 6 months post cardiac cath with stent to the left circumflex on 02/02/2024. This indicates that he may hold Plavix  in March 2026. See note from Hao Meng on 02/16/2024.   I also confirmed the patient resides in the state of [place] . As per [facility] [employer] telemedicine laws, the patient must reside in the state in which the provider is licensed.   Lamarr Satterfield, NP 06/25/2024, 8:34 AM [facility]    "

## 2024-06-25 NOTE — Telephone Encounter (Signed)
 Noted. Cardiac and anticoagulation clearance request has been sent to cardiology.

## 2024-06-25 NOTE — Telephone Encounter (Signed)
 Patient is not sure if he will proceed with the Colonoscopy due to having a stent placed back in August and was told he needed to be on Plavix  for the next 6 months. I told him the requesting office has placed for him to hold the Plavix  for 5 days. He told me he will see Dr. Darron and make sure he is okay to proceed with the procedure.

## 2024-06-26 ENCOUNTER — Other Ambulatory Visit: Payer: Self-pay | Admitting: Cardiovascular Disease

## 2024-07-25 ENCOUNTER — Encounter: Admitting: Nurse Practitioner

## 2024-08-01 ENCOUNTER — Other Ambulatory Visit
